# Patient Record
Sex: Female | Born: 1955 | Race: White | Hispanic: No | Marital: Married | Smoking: Former smoker
Health system: Southern US, Community
[De-identification: ages and names within clinical notes are randomized; demographics above are authoritative.]

## PROBLEM LIST (undated history)

## (undated) DIAGNOSIS — M5030 Other cervical disc degeneration, unspecified cervical region: Secondary | ICD-10-CM

## (undated) DIAGNOSIS — I10 Essential (primary) hypertension: Secondary | ICD-10-CM

## (undated) DIAGNOSIS — G5603 Carpal tunnel syndrome, bilateral upper limbs: Secondary | ICD-10-CM

## (undated) DIAGNOSIS — H40033 Anatomical narrow angle, bilateral: Secondary | ICD-10-CM

## (undated) DIAGNOSIS — D649 Anemia, unspecified: Secondary | ICD-10-CM

## (undated) DIAGNOSIS — R112 Nausea with vomiting, unspecified: Secondary | ICD-10-CM

## (undated) DIAGNOSIS — E119 Type 2 diabetes mellitus without complications: Secondary | ICD-10-CM

## (undated) DIAGNOSIS — Z9889 Other specified postprocedural states: Secondary | ICD-10-CM

## (undated) DIAGNOSIS — F419 Anxiety disorder, unspecified: Secondary | ICD-10-CM

## (undated) DIAGNOSIS — M1990 Unspecified osteoarthritis, unspecified site: Secondary | ICD-10-CM

## (undated) DIAGNOSIS — R7301 Impaired fasting glucose: Secondary | ICD-10-CM

## (undated) DIAGNOSIS — E785 Hyperlipidemia, unspecified: Secondary | ICD-10-CM

## (undated) DIAGNOSIS — C539 Malignant neoplasm of cervix uteri, unspecified: Secondary | ICD-10-CM

## (undated) HISTORY — DX: Carpal tunnel syndrome, bilateral upper limbs: G56.03

## (undated) HISTORY — DX: Impaired fasting glucose: R73.01

## (undated) HISTORY — PX: TONSILLECTOMY: SUR1361

## (undated) HISTORY — DX: Anatomical narrow angle, bilateral: H40.033

## (undated) HISTORY — DX: Other cervical disc degeneration, unspecified cervical region: M50.30

## (undated) HISTORY — DX: Hyperlipidemia, unspecified: E78.5

## (undated) HISTORY — PX: CATARACT EXTRACTION, BILATERAL: SHX1313

## (undated) HISTORY — PX: OTHER SURGICAL HISTORY: SHX169

## (undated) HISTORY — DX: Anemia, unspecified: D64.9

## (undated) HISTORY — DX: Malignant neoplasm of cervix uteri, unspecified: C53.9

## (undated) HISTORY — DX: Type 2 diabetes mellitus without complications: E11.9

## (undated) SURGERY — Surgical Case
Anesthesia: *Unknown

---

## 1985-09-15 HISTORY — PX: BREAST SURGERY: SHX581

## 2001-08-02 ENCOUNTER — Other Ambulatory Visit: Admission: RE | Admit: 2001-08-02 | Discharge: 2001-08-02 | Payer: Self-pay | Admitting: *Deleted

## 2001-09-21 ENCOUNTER — Other Ambulatory Visit: Admission: RE | Admit: 2001-09-21 | Discharge: 2001-09-21 | Payer: Self-pay | Admitting: *Deleted

## 2001-12-23 ENCOUNTER — Other Ambulatory Visit: Admission: RE | Admit: 2001-12-23 | Discharge: 2001-12-23 | Payer: Self-pay | Admitting: *Deleted

## 2002-08-02 ENCOUNTER — Other Ambulatory Visit: Admission: RE | Admit: 2002-08-02 | Discharge: 2002-08-02 | Payer: Self-pay | Admitting: *Deleted

## 2003-09-16 DIAGNOSIS — C539 Malignant neoplasm of cervix uteri, unspecified: Secondary | ICD-10-CM

## 2003-09-16 HISTORY — DX: Malignant neoplasm of cervix uteri, unspecified: C53.9

## 2003-11-30 ENCOUNTER — Other Ambulatory Visit: Admission: RE | Admit: 2003-11-30 | Discharge: 2003-11-30 | Payer: Self-pay | Admitting: *Deleted

## 2004-02-08 ENCOUNTER — Other Ambulatory Visit: Admission: RE | Admit: 2004-02-08 | Discharge: 2004-02-08 | Payer: Self-pay | Admitting: *Deleted

## 2004-04-19 ENCOUNTER — Inpatient Hospital Stay (HOSPITAL_COMMUNITY): Admission: AD | Admit: 2004-04-19 | Discharge: 2004-04-19 | Payer: Self-pay | Admitting: *Deleted

## 2004-04-30 ENCOUNTER — Ambulatory Visit: Admission: RE | Admit: 2004-04-30 | Discharge: 2004-04-30 | Payer: Self-pay | Admitting: Gynecology

## 2004-05-28 ENCOUNTER — Encounter (INDEPENDENT_AMBULATORY_CARE_PROVIDER_SITE_OTHER): Payer: Self-pay | Admitting: Specialist

## 2004-05-28 ENCOUNTER — Inpatient Hospital Stay (HOSPITAL_COMMUNITY): Admission: RE | Admit: 2004-05-28 | Discharge: 2004-05-31 | Payer: Self-pay | Admitting: *Deleted

## 2004-05-28 HISTORY — PX: ABDOMINAL HYSTERECTOMY: SHX81

## 2004-07-17 ENCOUNTER — Ambulatory Visit: Admission: RE | Admit: 2004-07-17 | Discharge: 2004-07-17 | Payer: Self-pay | Admitting: Gynecology

## 2004-11-15 ENCOUNTER — Ambulatory Visit: Admission: RE | Admit: 2004-11-15 | Discharge: 2004-11-15 | Payer: Self-pay | Admitting: Gynecology

## 2004-11-15 ENCOUNTER — Other Ambulatory Visit: Admission: RE | Admit: 2004-11-15 | Discharge: 2004-11-15 | Payer: Self-pay | Admitting: Gynecology

## 2004-11-15 ENCOUNTER — Encounter (INDEPENDENT_AMBULATORY_CARE_PROVIDER_SITE_OTHER): Payer: Self-pay | Admitting: Specialist

## 2005-02-12 ENCOUNTER — Other Ambulatory Visit: Admission: RE | Admit: 2005-02-12 | Discharge: 2005-02-12 | Payer: Self-pay | Admitting: *Deleted

## 2005-06-04 ENCOUNTER — Encounter (INDEPENDENT_AMBULATORY_CARE_PROVIDER_SITE_OTHER): Payer: Self-pay | Admitting: *Deleted

## 2005-06-04 ENCOUNTER — Other Ambulatory Visit: Admission: RE | Admit: 2005-06-04 | Discharge: 2005-06-04 | Payer: Self-pay | Admitting: Gynecology

## 2005-06-04 ENCOUNTER — Ambulatory Visit: Admission: RE | Admit: 2005-06-04 | Discharge: 2005-06-04 | Payer: Self-pay | Admitting: Gynecology

## 2005-10-13 ENCOUNTER — Other Ambulatory Visit: Admission: RE | Admit: 2005-10-13 | Discharge: 2005-10-13 | Payer: Self-pay | Admitting: *Deleted

## 2005-10-24 IMAGING — CR DG CHEST 2V
2 series · 2 of 2 positions shown · non-contrast
Comparison: none

CLINICAL DATA: The patient is pre-op for cervical cancer.
 CHEST - 2 VIEWS   
 PA and lateral views of the chest reveal the heart size to be normal with mild dilatation of the aorta.  The lung fields are clear.  There are mild degenerative changes of the thoracic spine.
 IMPRESSION
 No active disease.

[view not recorded (1 of 2)]
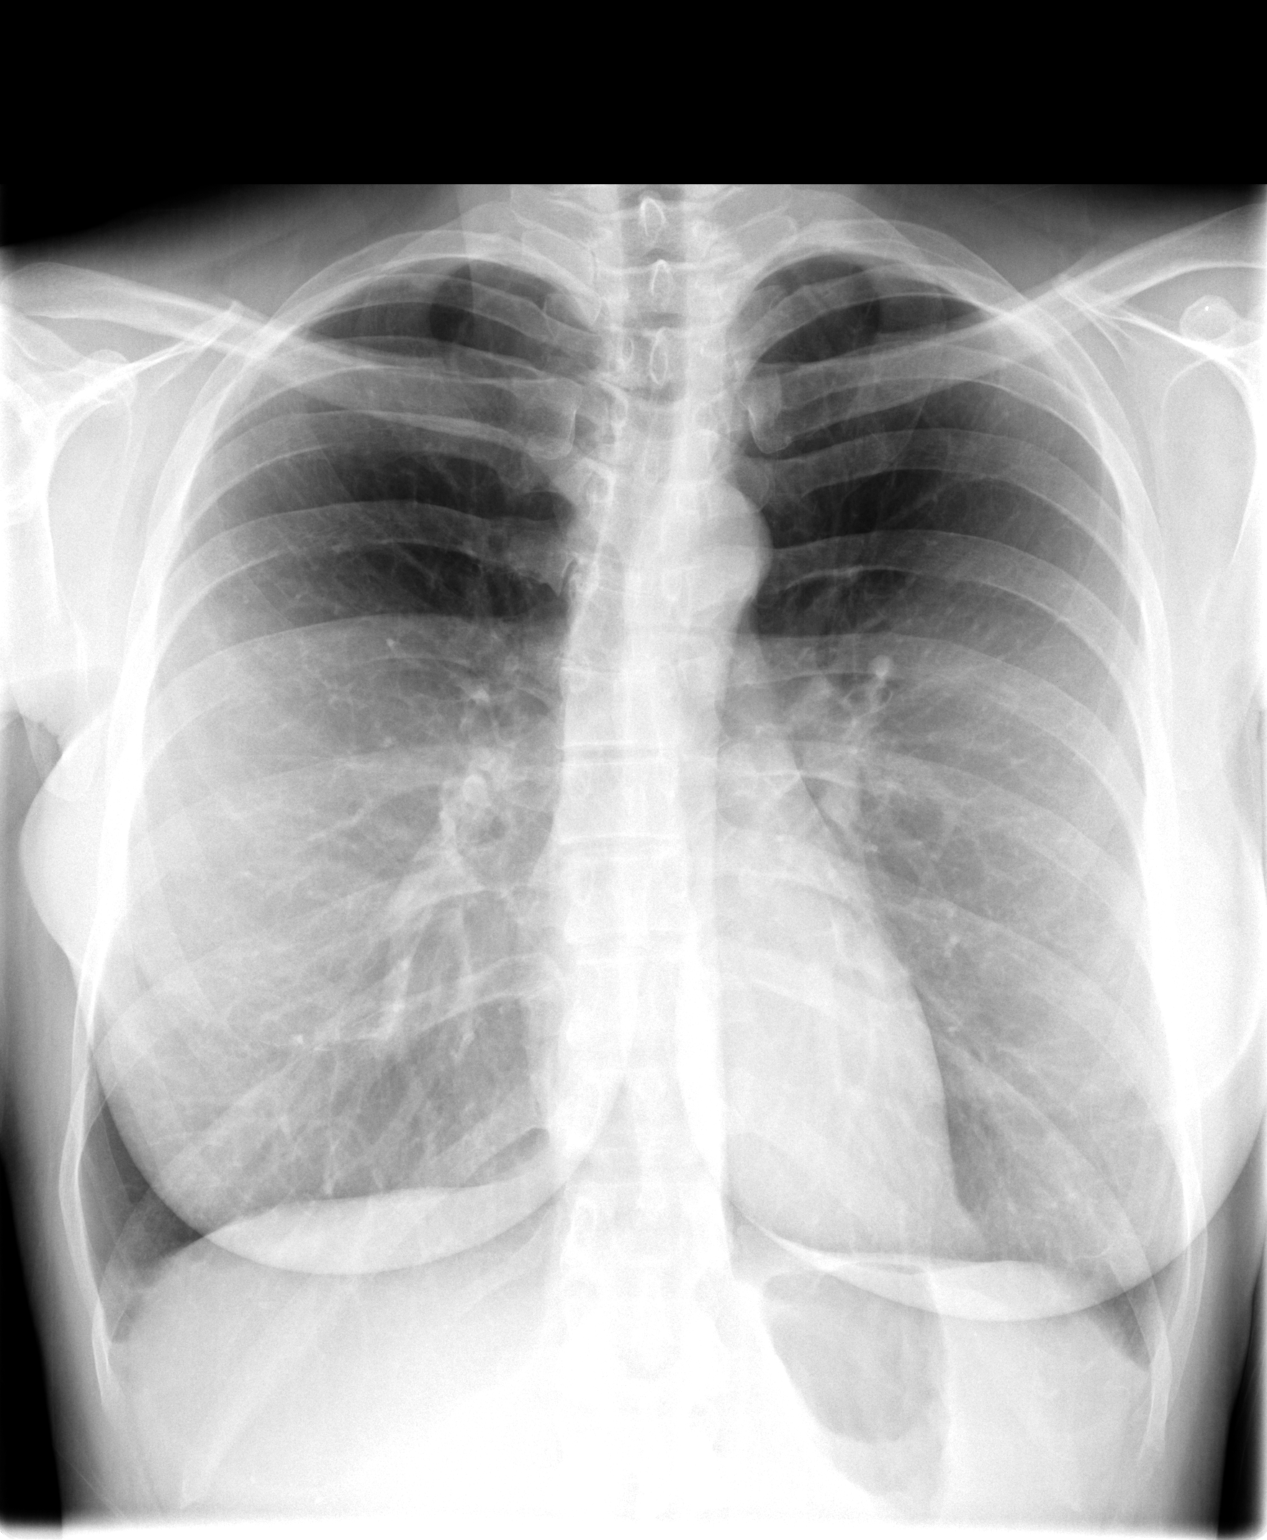

[view not recorded (2 of 2)]
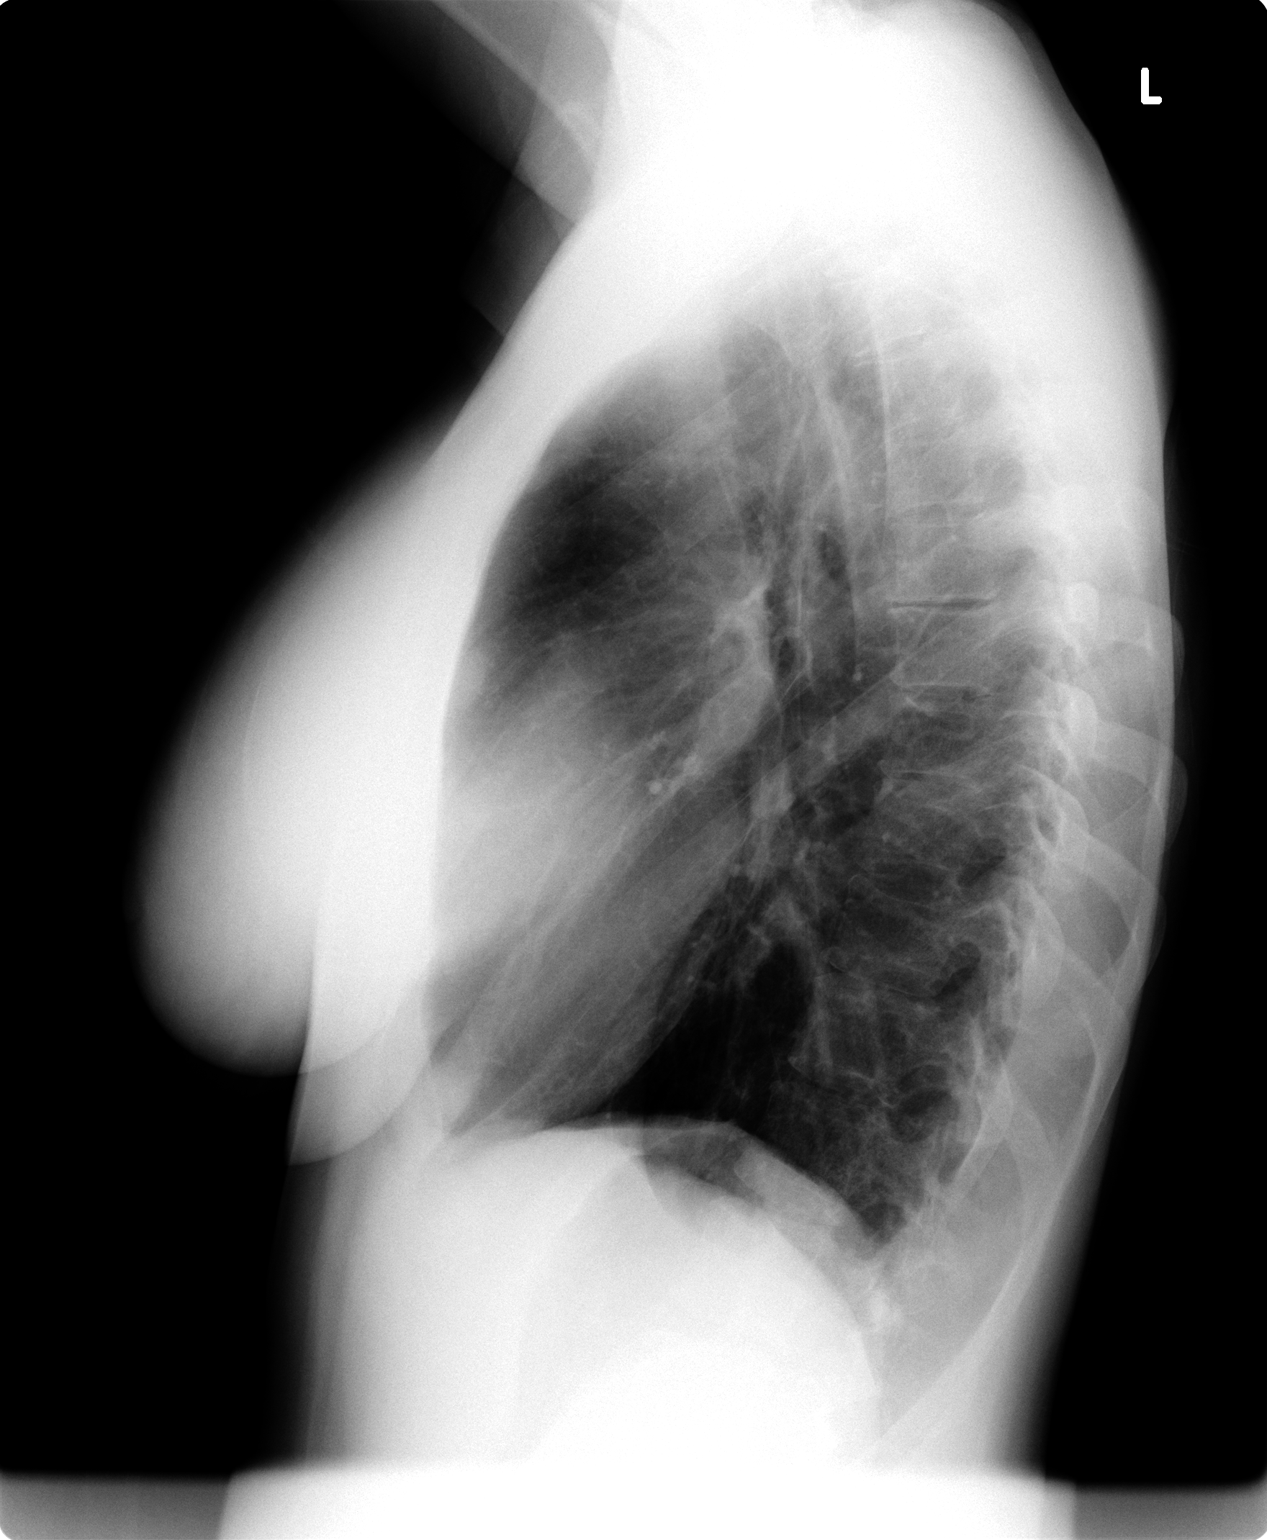

[2 of 2 positions shown; findings below may reference images not displayed]

## 2006-02-27 ENCOUNTER — Encounter (INDEPENDENT_AMBULATORY_CARE_PROVIDER_SITE_OTHER): Payer: Self-pay | Admitting: Specialist

## 2006-02-27 ENCOUNTER — Other Ambulatory Visit: Admission: RE | Admit: 2006-02-27 | Discharge: 2006-02-27 | Payer: Self-pay | Admitting: Gynecology

## 2006-02-27 ENCOUNTER — Ambulatory Visit: Admission: RE | Admit: 2006-02-27 | Discharge: 2006-02-27 | Payer: Self-pay | Admitting: Gynecology

## 2006-07-13 ENCOUNTER — Other Ambulatory Visit: Admission: RE | Admit: 2006-07-13 | Discharge: 2006-07-13 | Payer: Self-pay | Admitting: *Deleted

## 2006-07-16 LAB — HM COLONOSCOPY: HM Colonoscopy: NORMAL

## 2007-01-12 ENCOUNTER — Other Ambulatory Visit: Admission: RE | Admit: 2007-01-12 | Discharge: 2007-01-12 | Payer: Self-pay | Admitting: Gynecology

## 2007-01-12 ENCOUNTER — Ambulatory Visit: Admission: RE | Admit: 2007-01-12 | Discharge: 2007-01-12 | Payer: Self-pay | Admitting: Gynecology

## 2007-01-12 ENCOUNTER — Encounter (INDEPENDENT_AMBULATORY_CARE_PROVIDER_SITE_OTHER): Payer: Self-pay | Admitting: *Deleted

## 2007-07-08 ENCOUNTER — Ambulatory Visit: Payer: Self-pay | Admitting: Vascular Surgery

## 2007-07-19 ENCOUNTER — Other Ambulatory Visit: Admission: RE | Admit: 2007-07-19 | Discharge: 2007-07-19 | Payer: Self-pay | Admitting: *Deleted

## 2007-12-23 ENCOUNTER — Ambulatory Visit: Payer: Self-pay | Admitting: Vascular Surgery

## 2008-03-03 ENCOUNTER — Ambulatory Visit: Admission: RE | Admit: 2008-03-03 | Discharge: 2008-03-03 | Payer: Self-pay | Admitting: Gynecology

## 2008-03-03 ENCOUNTER — Other Ambulatory Visit: Admission: RE | Admit: 2008-03-03 | Discharge: 2008-03-03 | Payer: Self-pay | Admitting: Gynecology

## 2008-03-03 ENCOUNTER — Encounter: Payer: Self-pay | Admitting: Gynecology

## 2008-10-04 ENCOUNTER — Other Ambulatory Visit: Admission: RE | Admit: 2008-10-04 | Discharge: 2008-10-04 | Payer: Self-pay | Admitting: Obstetrics and Gynecology

## 2009-03-15 ENCOUNTER — Ambulatory Visit: Payer: Self-pay | Admitting: Sports Medicine

## 2009-03-15 DIAGNOSIS — Q742 Other congenital malformations of lower limb(s), including pelvic girdle: Secondary | ICD-10-CM | POA: Insufficient documentation

## 2009-03-15 DIAGNOSIS — M545 Low back pain, unspecified: Secondary | ICD-10-CM | POA: Insufficient documentation

## 2009-03-15 DIAGNOSIS — M5430 Sciatica, unspecified side: Secondary | ICD-10-CM | POA: Insufficient documentation

## 2009-03-15 DIAGNOSIS — M7750 Other enthesopathy of unspecified foot: Secondary | ICD-10-CM | POA: Insufficient documentation

## 2009-03-15 DIAGNOSIS — M216X9 Other acquired deformities of unspecified foot: Secondary | ICD-10-CM | POA: Insufficient documentation

## 2009-03-15 DIAGNOSIS — M25559 Pain in unspecified hip: Secondary | ICD-10-CM | POA: Insufficient documentation

## 2009-03-23 ENCOUNTER — Ambulatory Visit: Payer: Self-pay | Admitting: Sports Medicine

## 2009-03-23 ENCOUNTER — Encounter: Admission: RE | Admit: 2009-03-23 | Discharge: 2009-03-23 | Payer: Self-pay | Admitting: Sports Medicine

## 2009-03-23 DIAGNOSIS — Q762 Congenital spondylolisthesis: Secondary | ICD-10-CM | POA: Insufficient documentation

## 2009-05-07 ENCOUNTER — Ambulatory Visit: Payer: Self-pay | Admitting: Sports Medicine

## 2009-05-07 DIAGNOSIS — R209 Unspecified disturbances of skin sensation: Secondary | ICD-10-CM | POA: Insufficient documentation

## 2009-05-07 DIAGNOSIS — M479 Spondylosis, unspecified: Secondary | ICD-10-CM | POA: Insufficient documentation

## 2009-08-30 ENCOUNTER — Ambulatory Visit: Payer: Self-pay | Admitting: Sports Medicine

## 2009-08-30 DIAGNOSIS — M25539 Pain in unspecified wrist: Secondary | ICD-10-CM | POA: Insufficient documentation

## 2009-08-30 DIAGNOSIS — G5600 Carpal tunnel syndrome, unspecified upper limb: Secondary | ICD-10-CM | POA: Insufficient documentation

## 2010-08-26 ENCOUNTER — Encounter: Payer: Self-pay | Admitting: Sports Medicine

## 2010-10-15 NOTE — Assessment & Plan Note (Signed)
Summary: LEFT FINGER NUMBNESS,MC   Vital Signs:  Patient profile:   55 year old female BP sitting:   121 / 77  Vitals Entered By: Lillia Pauls CMA (August 30, 2009 11:28 AM)  History of Present Illness: 55 yo F presents with numbness left thumb/index finger off and on past year but more consistent recently. Usually worse at night when has numbness most of hand but starts thumb/hand Works in [employer] - not a lot of typing but is on computer sometimes Taking glucosamine and tried aleve Sometimes wakes her up at night and shakes hands No issues with neck, shoulder, elbow No issues with right side.  Physical Exam  General:  VS reviewed.  Well appearing, NAD. Msk:  L wrist: No gross deformity.  No thenar/hypothenar atrophy. No swelling/bruising No TTP. FROM wrist/fingers Tinels negative at elbow and wrist though does "feel it" in index finger and thumb with this - not numb. Negative phalens and reverse phalens. Grip strength 5/5.  Opponens strength 4/5 Cap refill < 2 sec. Additional Exam:  MSK u/s:  Median nerve visualized left wrist - 0.11cm, diagnostic of carpal tunnel syndrome/median nerve swelling   Impression & Recommendations:  Problem # 1:  CARPAL TUNNEL SYNDROME (ICD-354.0) Assessment New  Start nighttime cockup wrist splint.  Add vitamin b6.  Had been doing grip exercises on left - advised to stop these.  F/u in 1 month.  Consider neurontin if not improving or carpal tunnel injection.  Orders: US EXTREMITY NON-VASC REAL-TIME IMG [phone])  Problem # 2:  WRIST PAIN, LEFT (JWJ-191.47) Assessment: New  Orders: Brace Wrist (W2956)  Patient Instructions: 1)  Use the cock-up wrist splint at night when sleeping. 2)  Take vitamin B6 100mg  daily. 3)  Avoid the wrist exercises in your left wrist.  Relatively rest this. 4)  Follow up with [place] in 1 month.

## 2010-10-15 NOTE — Assessment & Plan Note (Signed)
Summary: ORTHOTICS/JW   Vital Signs:  Patient profile:   55 year old female BP sitting:   114 / 60  Vitals Entered By: Lillia Pauls CMA (March 23, 2009 10:40 AM)  History of Present Illness: 55 yo F here for f/u back pain to get orthotics.  Reviewed patient's note from a week ago.  She has had low back pain for 2 years.  Was seen by a chiropractor for this issue but not much benefit when she went there.  Has numbness and pain down into left leg along with her lower left back pain.  She did have x-rays at chiropractor in 11/08 which she brought with her today - evidence of Grade 1 spondy L4 on L5 with osteophytes around L3 and L4.  Mod amount of DDD especially on left side L4-5 and L5-S1.  See PE for exam - feel she would benefit from orthotics to correct feet and give her more normal gait.  Physical Exam  General:  Well-developed,well-nourished,in no acute distress; alert,appropriate and cooperative throughout examination Msk:  Both SI joints are mobile today and non painful SLR is neg at 70 deg bilat  toe, heel  and tandem walk normal. Left hip sits slightly lower with walking Repeated leg length today without evidence of shortening while lying or sitting up. No hallux rigidus. Cavus feet with some pronation when standing. Hammer toe deformity pronounced left 2nd toe and less so right 2nd toe.   Transverse arch breakdown bilaterally.  Large callus on left side. Diffuse mild metatarsal pain L > R. Splaying of 2nd-3rd toes on left.   Impression & Recommendations:  Problem # 1:  METATARSALGIA (ICD-726.70) Patient was fitted for a : standard, cushioned, semi-rigid orthotic. The orthotic was heated and afterward the patient stood on the orthotic blank positioned on the orthotic stand. The patient was positioned in subtalar neutral position and 10 degrees of ankle dorsiflexion in a weight bearing stance. After completion of molding, a stable base was applied to the orthotic  blank. The blank was ground to a stable position for weight bearing. Size: 8 red cambray Base: med EVA blue Posting: none Additional orthotic padding: metatarsal pads added with additional ones placed in two other pairs of shoes. 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  Will repeat these.  Hope correction of feet and addition of support will help.  Also to avoid extension exercises and focus on core with extension ONLY to neutral.  Given handout and shown several exercises for her to do on a daily basis.   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

## 2010-10-15 NOTE — Assessment & Plan Note (Signed)
Summary: NP BACK PAIN/MJD   Vital Signs:  Patient profile:   55 year old female Height:      68 inches Weight:      128 pounds BMI:     19.53 BP sitting:   110 / 70  Vitals Entered By: Lillia Pauls CMA (March 15, 2009 9:50 AM)  History of Present Illness: np here today with low back pain x 2 years. referred here by aaron Santa Cruz who she see for PT. has been seeing him now for 6 wks but has numbness and pain after PT  and exercises. pt is a walker and walks about 16 miles per week. the pain that she has radiates down both her legs and into her toes. pt does take advil to help relieve the pain. also has pain at night while in bed but only when she turns over in bed. MEDS: MVI, Calcium, and advil  biggest sign is numbness which is much worse Lt than Rt she sometimes only starts with numbness at foot  has piriformis signs on RT  uses a ball for massage and this helps  In morning has back pain she will have about 30  mins of this in AM  saw a chiropractor - [place] - last year who did LS spine and saw DDD RX did not make that much difference she thinks back pain worsened somewhat after Ca surgery as she took time to get core strength back  Past History:  Past Medical History: 5 years ago radical hys with lymph node dissection one lymph node on left was trapped with nerve she had left leg weakness following this surgery this has improved with time has much more numbness on left side  Physical Exam  General:  Well-developed,well-nourished,in no acute distress; alert,appropriate and cooperative throughout examination Msk:  Lt hip Hip flexor and sartorius are weak normal hip rotation and joint exam all other mm are strong  RT hip Rt buttocks direct tenderness at piriformis this reproduces pain but no real sciatic sxs with this today no hip  ROM norm strength in all MM groups  Both SI joints are mobile today and non painful  Faber on RT is tight SLR is neg at 70 deg  bilat back flexion causes some pain down left leg at > 90 deg lat bend and rotation of back causes no pain back extension slight lumbar pain at 30deg  toe, heel  and tandem walk norm  Left hip sits lower with walking leg length reveals that left is 1 cm shorter however there is some mild scoliosis in thoraco- lumbar spine (<10 deg) which balances some of the difference  Extremities:  Gait she turns RT foot outward about 10 deg and is neutral on left Neurologic:  No cranial nerve deficits noted. Station and gait are normal. Plantar reflexes are down-going bilaterally. DTRs are symmetrical throughout. Sensory, motor and coordinative functions appear intact.   Impression & Recommendations:  Problem # 1:  BACK PAIN, LUMBAR, CHRONIC (ICD-724.2) I supsect this is primarily DDD I do not see signs of disk herniation I do not think this contributes much to her chronic sciatic and nerve sxs ln left or RT this may contribute some but provocative tests of back do not provoke sxs  Problem # 2:  SCIATICA, BILATERAL (ICD-724.3) On left some of this is sciatic but a  lot may relate to branches of femoral nerve note she had one branch severed during surgery hip flexion and sartorius are week  on left - prob 2/2 this I think this nerve and contibutions/ feedback to sciatic are more easily irritated since orignial injury  Problem # 3:  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) no hx or sign of recurrent cervical cancer however, I do think the nerve injury at surgery has left her with residual left leg sxs  Problem # 4:  HAMMER TOE (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) This is pretty extreme on left given hammer toe pads to try  Problem # 5:  CAVUS DEFORMITY OF FOOT, 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) This is severe and may contribute to some increased numbness as I think she gets high impact and sometimes numbness starting from her feet we need to assess her shoes she is a candidate for good custom orthotics for walking and arch support for dress  shoes  return for this if willing  Problem # 6:  METATARSALGIA (ICD-726.70) large morton's callus left which needs padding on RTC  Problem # 7:  HIP PAIN, RIGHT (ICD-719.45) agree this is piriformis and prob cause of sciatic sxs on RT I doubt she has lumbar spinal stenosis but will review xrays  Patient Instructions: 1)  Two piriformis stretches 2)  standing hip rotations 3)  work left hip flexor and sartorius ( hacky sack) 4)  vitamin B 6 100 mgm per day 5)  lastly I would like to see you get custom orthotics and work with arch and metatarsal support in all your shoes

## 2010-10-15 NOTE — Letter (Signed)
Summary: *Consult Note  [facility]  [address]   [place], [place] [address]   Phone: [phone]  Fax: [phone]    Re:    Tracey Santiago DOB:    Oct 29, 1955   Dear Clifton Custard:    Thank you for requesting that we see the above patient for consultation.  A copy of the detailed office note will be sent under separate cover, for your review.  Evaluation today is consistent with a number of problems as noted in my assessment.  I think all of these contribute to her greater frequency of numbness particularly in left leg. I do think your therapy should be beneficial and I would suggest simply avoiding those that trigger more sxs. Please note that I gave her suggestions for a couple of specific exercises and stretches which I hope you can incorporate.  New Medications started today include: Vit B6 fr ay benefit on nerve irritation.    Thank you for this consultation.  If you have any further questions regarding the care of this patient, please do not hesitate to contact me @ [phone].  Thank you for this opportunity to look after your patient.  Sincerely,   Enid Baas MD

## 2010-10-15 NOTE — Assessment & Plan Note (Signed)
Summary: FU ck orthotics/jw   Vital Signs:  Patient profile:   55 year old female BP sitting:   100 / 60  Vitals Entered By: Lillia Pauls CMA (May 07, 2009 10:17 AM)  CC:  paresthesia of L hand.  History of Present Illness: 55yo F here for f/u orthotics and parethesia of L hand  Orthotics: s/p  ~6wks since she started wearing custom orthotics.  States improvement of back pain when she wears them however, she has not been able to wear them consistently b/c of her summer shoes. She has purchased MTP pads for all of her shoes and has been wearing the toe strap to help with her hammer toes.  Previous note and xray reviewed.  L hand paresthesia: Chronic condition.  States that it started  ~1yr ago without a known event but has been a gradual frequency over the past few months.  It is mainly in the 1st, 2nd, and 3rd digits of the L hand particularly over the dorsal aspect and mainly at night but has been lasting a few hours in the morning.  She denies any neck discomfort or symptoms in the upper arm or forearm.  No weakness and no dropping of items.  She spends several hours a day on the computer.    Back pain: Improvement in pain.  Has been performing flexion exercises as instructed.  No worsening of radiating pain down the left leg.  Recent xrays conveyed no change in Grade 1 spondy.    Physical Exam  General:  VS reviewed.  Well appearing, NAD. Neck:  No ttp along cervical spine ROM: Limitation of lateral rotation to the right  ~45deg compared to 70deg on left Limitation of lateral flexion to the right compared to the left  Special test: Neg Spurlings  Msk:  Back: ROM- flexion past 90deg, extension to -10 deg, lateral rotation  ~45deg Tests- neg straight leg sitting and lying  Left arm: 5/5 strength w/ supination, pronation, biceps, and wrist flexion/extension  Special test: neg phalens and tinels   Neurologic:  Strength testing of median and radial nerve  intact   Impression & Recommendations:  Problem # 1:  SPONDYLOLISTHESIS (AOZ-308.65) Assessment Unchanged Evidence of no change in Grade 1 spondy on recent xray.  Plan of treatment remains the same from before...avoid extension movements of the back and work on core strengthening of the ab muscles and hip flexors.  Plan is to get her connected for yoga/pilates classes.  Reassess in 3-4 months.  Problem # 2:  ARTHRITIS, CERVICAL SPINE (ICD-721.90) Assessment: New Likely cause of nerve irritation b/c of L trapezius muscle spasm leading to paresthesia sensation of dorsal aspect of left hand.  Pt has limited lateral rotation and lateral flexion to the right but improved by 5-10 deg simply by performing neck exercises in the office.  Plan to place her on home neck exercises to help relax the trapezius to gain more function and ROM of the neck.  Also, she is to take a close look at her position in front of the computer for proper position/posture to avoid straining the neck.   Exam not c/w median nerve entrapment seen more carpal tunnel syndrome. Xrays not beneficial at this time as it will not change the management.  Patient Instructions: 1)  Neck lateral rotation exercises- look towards right against resistance for 10 seconds (3 sets) 2)  Neck lateral flexion exercises- lean head towards shoulder against resistance for 10 seconds (3 sets) 3)  Arm swings to  work trapezius muscles 4)  No back extension exercises 5)  Please schedule a follow-up appointment in 3 months.

## 2010-10-17 NOTE — Consult Note (Signed)
Summary: [facility]  [facility]   Imported By: Marily Memos 09/19/2010 08:55:02  _____________________________________________________________________  External Attachment:    Type:   Image     Comment:   External Document

## 2010-12-30 LAB — HM MAMMOGRAPHY: HM Mammogram: NORMAL

## 2011-01-15 LAB — HM PAP SMEAR: HM Pap smear: NORMAL

## 2011-01-28 NOTE — Consult Note (Signed)
NAME:  Tracey Santiago, Tracey Santiago               ACCOUNT NO.:  [id number]   MEDICAL RECORD NO.:  [id number]          PATIENT TYPE:  OUT   LOCATION:  GYN                          FACILITY:  [facility]   PHYSICIAN:  De Blanch, M.D.DATE OF BIRTH:  14-Aug-1956   DATE OF CONSULTATION:  DATE OF DISCHARGE:                                 CONSULTATION   CHIEF COMPLAINT:  Cervical cancer.   INTERVAL HISTORY:  Since her last visit, the patient has done well.  She  saw Dr. [name] approximately six months ago.  She denies any GI or GU  symptoms.  She has no pelvic pain, pressure, vaginal bleeding or  discharge.   She had a colonoscopy recently, which is negative.   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  Final pathology showed minimal residual  disease.  Lymph nodes were negative.  Patient did not require any  further therapy postoperatively.   PAST MEDICAL HISTORY:  Medical illnesses none.   PAST SURGICAL HISTORY:  Cesarean section, radical hysterectomy, and  pelvic lymphadenectomy.   DRUG ALLERGIES:  None.   CURRENT MEDICATIONS:  Multivitamins.   OBSTETRICAL HISTORY:  Gravida 3.   FAMILY HISTORY:  Negative for gynecologic, breast, or colon cancer.   SOCIAL HISTORY:  Patient is married.  She works in an Geneticist, molecular and  exercises regularly.   REVIEW OF SYSTEMS:  A 10-point comprehensive review of systems is  negative, except as noted above.   PHYSICAL EXAMINATION:  VITAL SIGNS:  Weight 132 pounds.  GENERAL:  Patient is a healthy white female in no acute distress.  HEENT:  Negative.  NECK:  Supple without thyromegaly.  There is no supraclavicular or  inguinal adenopathy.  ABDOMEN:  Soft and nontender.  No mass, organomegaly, ascites, or  hernias are noted.  PELVIC:  EG/BUS, vagina, bladder, and urethra are normal.  The cervix  and uterus are surgically absent.  The  adnexa are without masses.  Rectovaginal exam confirms.  Pap smears are obtained.   IMPRESSION:  Cervical cancer, stage IB.  No evidence of recurrent  disease.   PLAN:  Patient is to return to see Dr. [name] in six months.  Return to  see [place] in one year.      De Blanch, M.D.  Electronically Signed     DC/MEDQ  D:  01/12/2007  T:  01/12/2007  Job:  [id number]   cc:   Telford Nab, R.N.  [address]  [place], [place] [address]   Almedia Balls. [name], M.D.  Fax: [phone]

## 2011-01-28 NOTE — Consult Note (Signed)
NAME:  Tracey Santiago, Tracey Santiago               ACCOUNT NO.:  [id number]   MEDICAL RECORD NO.:  [id number]          PATIENT TYPE:  OUT   LOCATION:  GYN                          FACILITY:  [facility]   PHYSICIAN:  De Blanch, M.D.DATE OF BIRTH:  [birth year]   DATE OF CONSULTATION:  03/03/2008  DATE OF DISCHARGE:                                 CONSULTATION   GYN ONCOLOGY CLINIC:   CHIEF COMPLAINT:  Cervical cancer.   INTERVAL HISTORY:  The patient returns today for continuing followup of  stage I-B cervical cancer.  Since her last visit, she has done well.  She denies any GI or GU symptoms, has no pelvic pain, pressure, vaginal  bleeding or discharge.  Functional status is excellent.   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  Final pathology showed minimal residual disease.  Lymph  nodes were negative, margins were negative and we did not offer any  additional therapy.   PAST MEDICAL HISTORY:  MEDICAL ILLNESSES:  None.  PAST SURGICAL HISTORY:  Cesarean section, radical hysterectomy, pelvic  lymphadenectomy.   DRUG ALLERGIES:  None.   CURRENT MEDICATIONS:  Multivitamins.   OBSTETRICAL HISTORY:  Gravida 3.   FAMILY HISTORY:  Negative for gynecologic, breast or colon cancer.   SOCIAL HISTORY:  The patient is married.  She works in an Geneticist, molecular.  She does not smoke.   REVIEW OF SYSTEMS:  Ten-point comprehensive review of systems negative,  except as noted above.   PHYSICAL EXAM:  Weight 135 pounds.  IN GENERAL:  The patient is a healthy, white female in no acute  distress.  HEENT:  Negative.  NECK:  Supple without thyromegaly.  There is no supraclavicular or  inguinal adenopathy.  ABDOMEN:  Soft, nontender, no mass, organomegaly, ascites or hernia is  noted.  Pfannenstiel incision is well-healed.  PELVIC EXAM:  EG, BUS, vagina, bladder and urethra are normal.   Cervix  and uterus are surgically absent.  The cuff is well-supported.  No  lesions are noted.  Bimanual and rectovaginal exam reveal no masses,  induration or nodularity.  LOWER EXTREMITIES:  Without edema or varicosities.   IMPRESSION:  Stage I-B cervical cancer, now with four years of followup  following radical hysterectomy and pelvic lymphadenectomy.  No evidence  of recurrent disease.   PLAN:  The patient will be transferring her care to Dr. Leanora Ivanoff.  She will see Dr. Hyacinth Meeker in six months and return to see [place] in one year.      De Blanch, M.D.  Electronically Signed     DC/MEDQ  D:  03/03/2008  T:  03/03/2008  Job:  [id number]   cc:   Telford Nab, R.N.  [address]  [place], [place] [address]   Dr. Leanora Ivanoff

## 2011-01-31 NOTE — Consult Note (Signed)
NAME:  Tracey, Santiago               ACCOUNT NO.:  [id number]   MEDICAL RECORD NO.:  [id number]          PATIENT TYPE:  OUT   LOCATION:  GYN                          [facility]ACILITY[facility]   PHYSICIAN:  De Blanch, M.D.DATE OF BIRTH:  07/21/1956   DATE OF CONSULTATION:  11/15/2004  DATE OF DISCHARGE:                                   CONSULTATION   A 55 year old white female returns for continuing followup of cervical  cancer.   INTERVAL HISTORY:  Since her last visit, she has seen Dr. Jeanine Luz,  apparently she had a good exam.   Since then, she has done well. She has some urinary hesitancy but does feel  like she ultimately empties completely. She has also had a slight amount of  constipation. At the time of surgery, she had a minor injury to her left  obturator nerve. She reports that she is walking three miles a day. She  notes that at the end of a three mile walk, she sometimes feels like her leg  drags somewhat.  Otherwise she is doing well. She did have some right lower  quadrant evaluated by Dr. [name] with an ultrasound which apparently was  negative.   HISTORY OF PRESENT ILLNESS:  The patient underwent radical hysterectomy and  pelvic lymphadenectomy May 28, 2004. Final pathology showed no  evidence of metastatic disease. There was a small amount of residual cancer  in the cervix and no other high risk features (stage Ib1 squamous cell  carcinoma of the cervix).   PAST MEDICAL HISTORY:  Medical illnesses none.   PAST SURGICAL HISTORY:  Cesarean section, radical hysterectomy.   ALLERGIES:  Drug allergies none.   CURRENT MEDICATIONS:  Multivitamins with iron.   OBSTETRICAL HISTORY:  Gravida 3.   FAMILY HISTORY:  Negative for gynecologic, breast or colon cancer.   SOCIAL HISTORY:  The patient is married, she works in an Geneticist, molecular, smokes  a few cigarettes a day, exercises regularly.   REVIEW OF SYMPTOMS:  Negative except for symptoms noted above.   PHYSICAL EXAMINATION:  VITAL SIGNS:  Height 5 foot 8, weight 135 pounds,  blood pressure 130/80.  GENERAL:  The patient is a healthy white female in no acute distress.  HEENT:  Negative.  NECK:  Supple without thyromegaly. There is no supraclavicular or inguinal  adenopathy.  ABDOMEN:  Soft, nontender, no mass, organomegaly, ascites or hernias are  noted.  PELVIC:  EGBUS, vagina, bladder, urethra are normal. The vaginal cuff is  well healed, well supported and no lesions are noted. Bimanual and  rectovaginal exam reveal no masses, induration or nodularity.   Pap smears are obtained.   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   PLAN:  The patient will return to see Almedia Balls. Fore, M.D. in three months  and return to see [place] in six months for continuing surveillance.      DC/MEDQ  D:  11/15/2004  T:  11/15/2004  Job:  [id number]   cc:   Almedia Balls. Fore,  M.D.  [address] [place]  [place] [address]  Fax: [phone]   Telford Nab, R.N.  [phone] [address]  [place], [place] [address]

## 2011-01-31 NOTE — H&P (Signed)
NAME:  Tracey Santiago, Tracey Santiago                         ACCOUNT NO.:  [id number]   MEDICAL RECORD NO.:  [id number]                   PATIENT TYPE:  INP   LOCATION:  NA                                   FACILITY:  [facility]   PHYSICIAN:  Almedia Balls. Fore, M.D.                DATE OF BIRTH:  [birth year]   DATE OF ADMISSION:  DATE OF DISCHARGE:                                HISTORY & PHYSICAL   DATE OF ADMISSION:  May 28, 2004.   CHIEF COMPLAINT:  Carcinoma of cervix.   HISTORY OF PRESENT ILLNESS:  The patient is a 55 year old gravida 2, para 2  whose last menstrual period was May 20, 2004.  She has been followed in  our office for many years.  She had an abnormal Pap smear shows ASCUS in  March of 2005.  Repeat Pap smear showed a higher grade lesion.  Colposcopy  and endocervical curettage revealed high grade dysplasia in June 2005.  The  patient underwent conization of the cervix in August because of inability to  move ahead quicker and because of vacation.  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  This was  in the deep conization area.  The exocervix showed only minimal atypical  squamous mucosa.  She is admitted at this time for radical hysterectomy,  possible bilateral salpingo-oophorectomy, lymph node dissection.  She has  had this procedure fully discussed with her and with her husband to include  the procedure in detail, the risks involved to include risk of anesthesia,  injury to bowel, bladder, blood vessels, ureters, postoperative hemorrhage,  infection, recuperation, use of a catheter postoperatively and possible slow  return of bowel function following this procedure.  She and her husband both  fully understand these considerations and wish to proceed on 28 May 2004.   PAST MEDICAL HISTORY:  Includes history of Cesarean section. She has taken  Midrin on occasion for migraine headaches and occasional alprazolam  and  Klonopin for an occasional anxiety problem.  Her health has been good  otherwise.   REVIEW OF SYMPTOMS:  HEENT:  Headaches as noted above.  CARDIORESPIRATORY:  Negative.  GASTROINTESTINAL:  Negative.  GENITOURINARY:  As in the present  illness.  NEUROMUSCULAR: Negative.   FAMILY HISTORY:  Noncontributory.   PHYSICAL EXAMINATION:  VITAL SIGNS: Height 5 feet 7-1/4 inches, weight 138  pounds, blood pressure 122/66, pulse 80, respirations 18.  GENERAL:  Well-developed white female in no acute distress.  HEENT:  Within normal limits.  NECK:  Supple without masses, adenopathy or bruits.  HEART:  Regular rate and rhythm without murmurs.  LUNGS: Clear to auscultation and percussion.  BREASTS:  Examined sitting and lying without mass.  Axilla negative.  There  are bilateral implants present.  ABDOMEN:  Flat and soft with no palpable mass or tenderness.  Lower  abdominal transverse scar  is well healed.  PELVIC:  External genitalia, Bartholin's, Urethral and Skene's glands within  normal limits.  Cervix is status post conization.  Uterus is mid position,  top normal size, nontender.  There are no palpable adnexal masses.  Anterior  and posterior cul-de-sac exam is confirmatory.  EXTREMITIES:  Within normal limits.  CENTRAL NERVOUS SYSTEM:  Grossly intact.  SKIN:  Without suspicious lesions.   IMPRESSION:  Squamous cell carcinoma of cervix, stage IB,1.   DISPOSITION:  As noted above.                                               Almedia Balls. [name], M.D.    SRF/MEDQ  D:  05/22/2004  T:  05/22/2004  Job:  [id number]

## 2011-01-31 NOTE — Op Note (Signed)
NAME:  Tracey Santiago, Tracey Santiago                         ACCOUNT NO.:  [id number]   MEDICAL RECORD NO.:  [id number]                   PATIENT TYPE:  INP   LOCATION:  0451                                 FACILITY:  [facility]   PHYSICIAN:  De Blanch, M.D.         DATE OF BIRTH:  [birth year]   DATE OF PROCEDURE:  05/28/2004  DATE OF DISCHARGE:  05/31/2004                                 OPERATIVE REPORT   PREOPERATIVE DIAGNOSIS:  Stage IBI squamous cell carcinoma of the cervix.   POSTOPERATIVE DIAGNOSIS:  Stage IBI squamous cell carcinoma of the cervix.   PROCEDURE:  Abdominal radical hysterectomy, pelvic lymphadenectomy, ovarian  transposition, suprapubic catheter placement.   SURGEON:  De Blanch, M.D.   ASSISTANTS:  Almedia Balls. [name], M.D., Telford Nab, RN.   ANESTHESIA:  General orotracheal anesthesia.   ESTIMATED BLOOD LOSS:  600 cc.   SURGICAL FINDINGS:  At the time of the exploratory laparotomy, the upper  abdomen, including the liver, spleen, stomach, omentum, small and large  bowel, and pelvic and periaortic lymph nodes were normal.  The uterus was  upper limits of normal in size.  The ovaries appear normal.  The patient had  requested if the ovaries appear normal that they be left in place.  The  cervix was slightly enlarged.  There were several enlarged pelvic lymph  nodes which were submitted for frozen section and returned as negative.  In  the course of performing the left obturator lymphadenectomy, a small portion  of the left obturator nerve was disrupted and reapproximated using 4-0  Prolene suture.   PROCEDURE:  The patient was brought to the operating room and after  satisfactory attainment of general anesthesia, was placed in a modified  lithotomy position in [place] stirrups.  The anterior abdominal wall,  perineum, and vagina were prepped with Betadine.  A Foley catheter was  inserted, and the patient was draped.  The abdomen was entered through a  prior Pfannenstiel incision.  The upper abdomen and pelvis were explored  with the above-noted findings, and a Bookwalter retractor was positioned  with care taken to avoid pressure on the femoral nerve.  The bowel was  packed out of the pelvis.  The uterus was grasped with long Kelly clamps.  The round ligaments were divided, and the perirectal and perivesical spaces  opened.  The attachment of the ovary to the uterine cornu and fallopian tube  were cross-clamped, divided, free tied, and suture-ligated, thus preserving  the tubes and ovaries.  The uterine artery was identified, skeletonized, and  then doubly clipped, divided, and the proximal portion of the uterine artery  was ligated with a 2-0 silk suture for further identification.  The cardinal  ligament was isolated and divided using a linear stapling device.  A similar  procedure was performed on both sides of the pelvis.   The ureters were detached from their attachment to the peritoneum, just  cephalad to the  uterosacral ligaments and mobilized laterally.  An incision  was made in the posterior cul de sac overlying the rectovaginal septum and  carried laterally.  The rectovaginal septum was then developed, and the  uterosacral ligaments were skeletonized.  The uterosacral ligaments were  then divided with the linear stapler.  The bladder flap was advanced to  sharp and blunt dissection off of the cervix and upper vagina.  The ureters  were then mobilized from the paracervical tunnel using a right angle clamp  and Anderson clamps above the ureter.  The anterior vesicouterine ligament  was then clamped, divided, and suture ligated.  This procedure was repeated  until the ureter was fully mobilized to its insertion with the bladder.  The  ureter was then held laterally, and the posterior vesicouterine ligament was  clamped, cut, and suture ligated.  Paracervical tissue was then clamped,  cut, and suture ligated.  The vaginal angles  were crossed clamped, divided,  and the vaginal transected.  The uterus, cervix, and upper vagina were  handed off the operative field after an adequate cuff of vaginal tissue was  ascertained.  The vaginal angles were transfixed with 0 Vicryl, and the  central portion of the vagina were closed with interrupted figure-of-eight  sutures of 0 Vicryl.  The peritoneum of the posterior cul de sac, vaginal  angle, and peritoneum from the bladder flap were then reapproximated with a  series of interrupted sutures, thus obliterating the cul de sac.   Attention was turned to performing the pelvic lymphadenectomy.  All of the  lymph nodes overlying the external iliac artery and vein and internal iliac  artery were excised and submitted as external iliac nodes.  Using the vein  retractor, the external iliac vein was elevated, and we entered the  obturator fossa.  Dissection was then proceeded to remove lymph nodes from  the obturator fossa, extending to the psoas muscle and obturator internus  muscle.  This is carried down to the obturator canal.  The obturator  nerve  was identified, and the dissection then carried cephalad.  In the course of  completing this dissection, the juncture of the bifurcation of the internal  and external iliac veins, exposure was difficult, and after removing the  lymph nodes, it was recognized that a small defect had been created in the  obturator nerve.  The obturator nerve sheath was then reapproximated with  three interrupted sutures of 4-0 Prolene, resulting in good approximation of  the nerve.  Hemostasis was achieved throughout the lymphadenectomy with  cautery and hemoclips.   The ovaries were marked with two large hemoclips and then mobilized into the  pericolic gutters, where they were sutured with 2-0 Vicryl.   The retropubic space was opened, and an incision was made in the dome of the bladder.  A suprapubic catheter was brought through a stab incision  just  above the pubis, and the catheter placed in the bladder.  The bladder was  then closed with two layers of suture, the first being a full-thickness  closure, using 2-0 Vicryl.  The second layer was an embrocating suture using  2-0 Vicryl.   Packs were removed from the pelvis, and the pelvis was irrigated.  Hemostasis was ascertained.  The packs and retractors were removed, and the  anterior abdominal wall closed in layers.  The first was a running 2-0  Vicryl suture on the peritoneum.  The subfascial region was explored,  hemostasis achieved with cautery, and the fascia  reapproximated with a  running suture of 0 PDS.  Subcutaneous tissue was irrigated.  Hemostasis  achieved with  cautery, and the skin was closed with skin staples.  A dressing was applied.  The suprapubic catheter was sutured to the skin with 2-0 silk suture.  Patient was awakened from anesthesia and taken to the recovery room in  satisfactory condition.  Sponge, needle, and instrument counts were correct  x2.                                               De Blanch, M.D.    DC/MEDQ  D:  05/31/2004  T:  06/01/2004  Job:  [id number]   cc:   Telford Nab, R.N.  [address]  [place], [place] [address]   Almedia Balls. Fore, M.D.  [phone] [address] [place]  [place] [address]  Fax: [phone]

## 2011-01-31 NOTE — Discharge Summary (Signed)
NAME:  DONDI, Tracey Santiago                         ACCOUNT NO.:  [id number]   MEDICAL RECORD NO.:  [id number]                   PATIENT TYPE:  INP   LOCATION:  0451                                 FACILITY:  [facility]   PHYSICIAN:  Almedia Balls. Fore, M.D.                DATE OF BIRTH:  1955/11/11   DATE OF ADMISSION:  05/28/2004  DATE OF DISCHARGE:  05/31/2004                                 DISCHARGE SUMMARY   HISTORY:  The patient is a 55 year old with stage IB1 squamous cell  carcinoma of the cervix, who was admitted for radical hysterectomy on 28 May 2004.  The remainder of her history and physical are as previously  dictated.  Preoperative lab work includes hemoglobin of 12.8, 6900 white  blood cells.  CMET panel was slightly elevated on admission with glucose  slightly elevated at 111.  Immediate postoperative hemoglobin 11.7.  Hemoglobin on 14 September, 10.8 with 10,800 white blood cells.  Glucose on  14 September was elevated slightly at 153.  Calcium down at 8.1.  Blood type  A positive.  Chest x-ray was within normal limits without any lesions.   HOSPITAL COURSE:  The patient was taken to the operating room on 28 May 2004, at which time radical hysterectomy, pelvic lymphadenectomy  were performed.  During this procedure, the patient sustained an injury to  the left obturator nerve which was immediately repaired.  Her postoperative  course was marked by some nausea and complaints of pain.  However, she was  ambulated with advancement of her diet over the next several days.  Her  suprapubic catheter was draining well, and output had picked up on 31 May 2004.  It was felt that she could be discharged at this time.   FINAL DIAGNOSIS:  Squamous cell carcinoma of the cervix, Stage IB1.   OPERATIONS:  Radical hysterectomy, pelvic lymphadenectomy.  Pathology report  showed residual squamous cell carcinoma of the cervix without extension  beyond the cervix.  All lymph  nodes negative for tumor.   DISPOSITION:  Discharged home to return to the office in approximately 4-5  days to ascertain the status of her suprapubic catheter and to make  arrangements for removal of same.  She is to gradually progress her  activities over several weeks at home and to limit lifting and driving for 2  weeks at home.  She was fully ambulatory, on a regular diet, and in good  condition at the time of discharge.   DISCHARGE MEDICATIONS:  She was given prescriptions for Mepergan Fortis #30  to be taken 1-2 q.4-6h. p.r.n. pain, doxycycline 100 mg #12 to be taken 1  b.Santiago.d., Reglan 10 mg #30 to be taken 1 t.Santiago.d. and Urecholine 25 mg to be  taken 1 q.Santiago.d.   She will call for any problems.      SRF/MEDQ  D:  05/31/2004  T:  06/02/2004  Job:  [id number]   cc:   De Blanch, M.D.

## 2011-01-31 NOTE — Consult Note (Signed)
NAME:  Tracey Santiago, Tracey Santiago               ACCOUNT NO.:  [id number]   MEDICAL RECORD NO.:  [id number]          PATIENT TYPE:  OUT   LOCATION:  GYN                          FACILITY:  [facility]   PHYSICIAN:  De Blanch, M.D.DATE OF BIRTH:  Nov 02, 1955   DATE OF CONSULTATION:  DATE OF DISCHARGE:                                   CONSULTATION   CHIEF COMPLAINT:  Cervical cancer.   INTERVAL HISTORY SINCE HER LAST VISIT:  The patient has done well.  Her only  symptom is that of urinary hesitancy and decreased sensation to the bladder.  She does seem to be having adequate bladder emptying and is not having any  trouble with urinary tract infections.  She continues to work full time.  She denies any pelvic pain, pressure, vaginal bleeding, or discharge.   HISTORY OF PRESENT ILLNESS:  The patient underwent a radical hysterectomy  and pelvic lymphadenectomy in September of 2005 for stage I-B squamous cell  carcinoma of the cervix.  Final pathology showed a small amount of residual  cervical cancer.  Pelvic lymph nodes and surgical margins were free of  disease.   PAST MEDICAL HISTORY:  Medical illnesses:  None.   PAST SURGICAL HISTORY:  1.  Cesarean section.  2.  Radical hysterectomy with pelvic lymphadenectomy.   DRUG ALLERGIES:  None.   CURRENT MEDICATIONS:  Multivitamins.   OBSTETRICAL HISTORY:  Gravida 3.   FAMILY HISTORY:  Negative for gynecologic, breasts, or colon cancer.   SOCIAL HISTORY:  The patient is married.  She works in an Geneticist, molecular and  exercises regularly.   REVIEW OF SYSTEMS:  A 10-point comprehensive review of systems negative  except as noted above.   PHYSICAL EXAMINATION:  VITAL SIGNS:  Weight 136 pounds, blood pressure  120/70, pulse 60, respiratory rate 18.  GENERAL:  The patient is a healthy white female in no acute distress.  HEENT:  Negative.  NECK:  Supple without thyromegaly.  There is no supraclavicular or inguinal  adenopathy.  ABDOMEN:  Soft,  nontender, no masses, organomegaly, ascites, or hernias  noted.  PELVIC EXAM:  __________, BUS, vagina, bladder, and urethra are normal.  Cervix and uterus are surgically absent.  The vaginal cuff is normal, no  lesions are noted.  Bimanual and rectovaginal exam reveal no masses,  induration, or nodularity.  EXTREMITIES:  The lower extremities are without edema or varicosities.   IMPRESSION:  Stage I-B squamous cell carcinoma of the cervix and no evidence  of recurrent disease since September of 2005.   PLAN:  Pap smears were obtained today.  The patient returns to see Dr. Melba Coon in four months and then return to see me six months thereafter.      De Blanch, M.D.  Electronically Signed     DC/MEDQ  D:  02/27/2006  T:  02/27/2006  Job:  [id number]   cc:   Almedia Balls. [name], M.D.  Fax: [phone]   Telford Nab, R.N.  [address]  [place], [place] [address]

## 2011-01-31 NOTE — Consult Note (Signed)
NAME:  Tracey Santiago, Tracey Santiago               ACCOUNT NO.:  [id number]   MEDICAL RECORD NO.:  [id number]          PATIENT TYPE:  OUT   LOCATION:  GYN                          FACILITY:  [facility]   PHYSICIAN:  De Blanch, M.D.DATE OF BIRTH:  May 12, 1956   DATE OF CONSULTATION:  06/04/2005  DATE OF DISCHARGE:  06/04/2005                                   CONSULTATION   CHIEF COMPLAINT:  Cervical cancer.   INTERVAL HISTORY:  Since her last visit, the patient has done well.  She  denies any GI or GU symptoms, has no pelvic pain, pressure, vaginal  bleeding, or discharge.  Functional status is excellent.  She is having no  difficulties with bladder function.  She saw Dr. Jeanine Luz approximately 3  months ago and was doing well at that time as well.   HISTORY OF PRESENT ILLNESS:  The patient underwent a radical hysterectomy  and pelvic lymphadenectomy, September 2005, for a stage IB squamous cell  carcinoma of the cervix.  Final pathology showed a small amount of residual  cervical disease with no evidence of metastases, and all margins were  negative.   PAST MEDICAL HISTORY:   MEDICAL ILLNESSES:  None.   PAST SURGICAL HISTORY:  1.  Cesarean section.  2.  Radical hysterectomy and pelvic lymphadenectomy.   DRUG ALLERGIES:  None.   CURRENT MEDICATIONS:  Multivitamins with iron.   OBSTETRICAL HISTORY:  Gravida 3.   FAMILY HISTORY:  Negative for gynecologic, breast, or colon cancer.   SOCIAL HISTORY:  The patient is married.  She works in an Geneticist, molecular and  exercises regularly.   REVIEW OF SYSTEMS:  A 10-point comprehensive review of systems is reviewed  and is negative except as noted above.   PHYSICAL EXAMINATION:  VITAL SIGNS:  Weight 140 pounds, blood pressure  130/80, pulse 80.  GENERAL:  The patient is a healthy white female in no acute distress.  HEENT:  Negative.  NECK:  Supple without thyromegaly.  There is no supraclavicular or inguinal  adenopathy.  ABDOMEN:  Soft,  nontender.  No mass, organomegaly, ascites, or hernias are  noted.  PELVIC EXAM:  EG//BUS, vagina, bladder, urethra are normal.  Vaginal vault  is well supported, and no lesions are noted.  Bimanual and rectovaginal exam  reveal no masses, induration, or nodularity.  LOWER EXTREMITIES:  Without edema or varicosities.   IMPRESSION:  Stage IB1 squamous cell carcinoma of the cervix, status post  radical hysterectomy September 2005.  No evidence of recurrent disease.   PLAN:  Pap smears are obtained.  We will ask the patient to return to see  Dr. Jeanine Luz in 4 months and return to see [place] in 8 months.      De Blanch, M.D.  Electronically Signed     DC/MEDQ  D:  06/09/2005  T:  06/09/2005  Job:  [id number]   cc:   Almedia Balls. [name], M.D.  Fax: [phone]   Telford Nab, R.N.  [address]  [place], [place] [address]

## 2011-01-31 NOTE — Consult Note (Signed)
NAME:  Tracey Santiago, COLLYMORE               ACCOUNT NO.:  [id number]   MEDICAL RECORD NO.:  [id number]          PATIENT TYPE:  OUT   LOCATION:  GYN                          FACILITY:  [facility]   PHYSICIAN:  De Blanch, M.D.DATE OF BIRTH:  11/24/1955   DATE OF CONSULTATION:  07/17/2004  DATE OF DISCHARGE:                                   CONSULTATION   A 55 year old white female returns for follow-up, having undergone a radical  hysterectomy and pelvic lymphadenectomy May 28, 2004, for a squamous  cell carcinoma of the cervix.  Final pathology showed that 19 lymph nodes  were free of disease.  She had small amount of residual disease in the  cervix, but margins were negative.  The patient's postoperative course was  complicated by obturator nerve injury (left).  The patient is currently  ambulating well, walking approximately 2 miles a day.  She does note that  her leg is somewhat tired by the end of this walk.  She does feel that  strength is returning to the leg.   She also had a postoperative bleed and hematoma under her Pfannenstiel  incision which apparently did not become apparent for approximately a week  after surgery.  This has completely resolved.  She currently denies any  vaginal bleeding or discharge.  Her functional status is improving steadily.   PHYSICAL EXAMINATION:  VITAL SIGNS:  Weight 128 pounds, blood pressure  130/82.  GENERAL:  The patient is a healthy white female in no acute distress.  HEENT:  Negative.  NECK:  Supple without thyromegaly.  There is no supraclavicular or inguinal  adenopathy.  ABDOMEN:  Soft, nontender.  No mass, organomegaly, ascites, or hernias  noted.  Her Pfannenstiel incision is healing well.  The suprapubic site is  also healing well.  PELVIC:  EG/BUS, vagina, bladder, urethra are normal.  The vaginal cuff is  healing nicely.  No lesions are noted.  Bimanual and rectovaginal exam  reveal no masses, induration, or nodularity.   IMPRESSION:  Stage IB1 squamous cell carcinoma of the cervix, status post  radical hysterectomy with very favorable pathologic features.  I would not  recommend any additional therapy.   The patient is given the okay to return to full levels of activity including  resuming sexual intercourse.   With regard to her obturator nerve injury, she will continue to be active,  continue physical therapy, and we will continue to monitor her progress.  She will return to see Dr. Jeanine Luz in 3 months and return to see [place] in 6  months.     [name]   DC/MEDQ  D:  07/17/2004  T:  07/17/2004  Job:  [id number]   cc:   Almedia Balls. Fore, M.D.  [phone] [place]  [place] [address]  Fax: [phone]   Telford Nab, R.N.  [phone] [address]  [place], [place] [address]

## 2011-01-31 NOTE — Consult Note (Signed)
NAME:  Tracey Santiago, Tracey Santiago NO.:  [id number]   MEDICAL RECORD NO.:  [id number]                   PATIENT TYPE:  OUT   LOCATION:  GYN                                  FACILITY:  [facility]   PHYSICIAN:  De Blanch, M.D.         DATE OF BIRTH:  Jan 22, 1956   DATE OF CONSULTATION:  05/01/2004  DATE OF DISCHARGE:                                   CONSULTATION   REFERRING PHYSICIAN:  Almedia Balls. [name], M.D.   HISTORY OF PRESENT ILLNESS:  This is a 55 year old white female seen in  consultation at the request of Dr. Chriss Driver regarding the diagnosis of  newly identified cervical carcinoma.   Review of the patient's records revealed that she had normal Pap  smears up  until between 1995 and 2001.  In 2002 and 2003 she had a Pap smear showing  ascus.  Colposcopy and cervical biopsies were negative in 2003.  Subsequent  Pap smears were benign in 2004 and revealed ascus in March of 2005.  In May  of 2005 the Pap smear was interpreted as high grade SIL.  Colposcopy and  biopsy showed a positive endocervical curettage and the patient underwent a  cold knife conization on April 19, 2004.  Pathology from the cone specimen  revealed an invasive squamous cell carcinoma measuring at least 0.5 cm in  maximum depth and tumor was found to involve the surgical margins of  resection.   The patient had some bleeding following her conization but has been  retreated with __________solution.   PAST MEDICAL HISTORY:  Medical Illnesses:  None.   PAST SURGICAL HISTORY:  Cesarean section.   ALLERGIES:  Drug allergies:  None.   CURRENT MEDICATIONS:  1. Folic acid.  2. Multivitamins with iron.   OBSTETRICAL HISTORY:  Gravida 3.   FAMILY HISTORY:  Negative for gynecologic, breast, or colon cancer.   SOCIAL HISTORY:  The patient is married.  She works in an [employer], [employer] and  smokes only a few cigarettes a day.   REVIEW OF SYSTEMS:  Essentially negative.  The patient  denies any weight  loss, any pelvic pain or pressure, any leg pain or leg edema or any GI or GU  symptoms.   PHYSICAL EXAMINATION:  VITAL SIGNS:  Height 5 feet 8, weight 136 pounds,  blood pressure 132/78, pulse 80, respiratory rate 18.  GENERAL:  The patient is a healthy white female in no acute distress.  HEENT:  Negative.  NECK:  Supple without thyromegaly.  There is no supraclavicular or inguinal  adenopathy.  ABDOMEN:  Soft and nontender.  No mass, organomegaly, ascites, or hernia is  noted.  She has a well-healed Pfannenstiel incision.  PELVIC:  EGBUS, vagina, bladder, and urethra are normal.  The cervix is post  conization.  Suture material and __________solution is still present.  There  is no active bleeding.  The cervix feels hard and in maximum diameter is  approximately 3.5-4 cm.  No gross lesion is noted.  There is no  parametrial  or vaginal involvement.  Rectovaginal exam confirms.  There are no adnexal  masses.   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   I had a lengthy discussion with the patient and her husband regarding  management options and discussed with them both the option for radiation  therapy and the option for radical hysterectomy.  We discussed the pros and  cons of each and the risks and potential benefits of each, understanding  that both therapies have equivalent end results in terms of overall  survival.   The patient indicates she would prefer to undergo radical hysterectomy and  pelvic lymphadenectomy.  We discussed the question regarding preservation or  removal of her ovaries and the patient is undecided at the conclusion of our  discussion, although I encouraged her to strongly consider removal of the  ovaries, given her perimenopausal status.  I would like to allow the cervix  to heal from the conization for six weeks and we will schedule surgery in  conjunction with Dr. Chriss Driver thereafter.   The risks of surgery  including hemorrhage, infection, injury to adjacent  viscera, thromboembolic complications, were discussed at length.  I  specifically discussed with the patient and her husband the risk of bladder  dysfunction following radical hysterectomy and the need for a suprapubic  catheter following surgery.  All other questions were answered.                                               De Blanch, M.D.    DC/MEDQ  D:  05/01/2004  T:  05/01/2004  Job:  [id number]   cc:   Almedia Balls. Fore, M.D.  [phone] [address] [place]  [place] [address]  Fax: [phone]   Telford Nab, R.N.  [phone] [address]  [place], [place] [address]

## 2011-06-03 ENCOUNTER — Other Ambulatory Visit (HOSPITAL_COMMUNITY): Payer: Self-pay | Admitting: Neurological Surgery

## 2011-06-03 DIAGNOSIS — M545 Low back pain, unspecified: Secondary | ICD-10-CM

## 2011-06-17 ENCOUNTER — Ambulatory Visit (HOSPITAL_COMMUNITY)
Admission: RE | Admit: 2011-06-17 | Discharge: 2011-06-17 | Disposition: A | Discharge status: Home / Self Care | Payer: BC Managed Care – PPO | Source: Ambulatory Visit | Attending: Neurological Surgery | Admitting: Neurological Surgery

## 2011-06-17 DIAGNOSIS — M545 Low back pain, unspecified: Secondary | ICD-10-CM

## 2011-06-17 DIAGNOSIS — Q762 Congenital spondylolisthesis: Secondary | ICD-10-CM | POA: Insufficient documentation

## 2011-06-17 DIAGNOSIS — Z79899 Other long term (current) drug therapy: Secondary | ICD-10-CM | POA: Insufficient documentation

## 2011-06-17 DIAGNOSIS — M48061 Spinal stenosis, lumbar region without neurogenic claudication: Secondary | ICD-10-CM | POA: Insufficient documentation

## 2011-06-17 MED ORDER — IOHEXOL 180 MG/ML  SOLN
14.0000 mL | Freq: Once | INTRAMUSCULAR | Status: AC | PRN
  Administered 2011-06-17: 14 mL via INTRATHECAL

## 2011-07-14 ENCOUNTER — Encounter (HOSPITAL_COMMUNITY): Payer: Self-pay

## 2011-07-14 ENCOUNTER — Encounter (HOSPITAL_COMMUNITY)
Admission: RE | Admit: 2011-07-14 | Discharge: 2011-07-14 | Disposition: A | Discharge status: Home / Self Care | Payer: BC Managed Care – PPO | Source: Ambulatory Visit | Attending: Neurological Surgery | Admitting: Neurological Surgery

## 2011-07-14 HISTORY — DX: Nausea with vomiting, unspecified: R11.2

## 2011-07-14 HISTORY — DX: Anxiety disorder, unspecified: F41.9

## 2011-07-14 HISTORY — DX: Other specified postprocedural states: Z98.890

## 2011-07-14 HISTORY — DX: Essential (primary) hypertension: I10

## 2011-07-14 LAB — SURGICAL PCR SCREEN
MRSA, PCR: NEGATIVE
Staphylococcus aureus: NEGATIVE

## 2011-07-14 LAB — CBC
HCT: 38.9 % (ref 36.0–46.0)
Hemoglobin: 13.4 g/dL (ref 12.0–15.0)
MCH: 32.4 pg (ref 26.0–34.0)
MCHC: 34.4 g/dL (ref 30.0–36.0)
MCV: 94 fL (ref 78.0–100.0)
Platelets: 242 10*3/uL (ref 150–400)
RBC: 4.14 MIL/uL (ref 3.87–5.11)
RDW: 12.3 % (ref 11.5–15.5)
WBC: 8.7 10*3/uL (ref 4.0–10.5)

## 2011-07-14 LAB — TYPE AND SCREEN
ABO/RH(D): A POS
Antibody Screen: NEGATIVE

## 2011-07-14 LAB — ABO/RH: ABO/RH(D): A POS

## 2011-07-14 NOTE — Pre-Procedure Instructions (Signed)
20 KHAMYA TOPP  07/14/2011   Your procedure is scheduled on:  Mon,Nov 5th @ 0730  Report to [facility] at 0530 AM.  Call this number if you have problems the morning of surgery: [phone]   Remember:   Do not eat food:After Midnight.  Do not drink clear liquids: 4 Hours before arrival.  Take these medicines the morning of surgery with A SIP OF WATER: Alprazolam   Do not wear jewelry, make-up or nail polish.  Do not wear lotions, powders, or perfumes. You may wear deodorant.  Do not shave 48 hours prior to surgery.  Do not bring valuables to the hospital.  Contacts, dentures or bridgework may not be worn into surgery.  Leave suitcase in the car. After surgery it may be brought to your room.  For patients admitted to the hospital, checkout time is 11:00 AM the day of discharge.   Patients discharged the day of surgery will not be allowed to drive home.  Name and phone number of your driver: Kyley Solow husband [phone]  Special Instructions: CHG Shower Use Special Wash: 1/2 bottle night before surgery and 1/2 bottle morning of surgery.   Please read over the following fact sheets that you were given: Pain Booklet, Coughing and Deep Breathing, Blood Transfusion Information, MRSA Information and Surgical Site Infection Prevention

## 2011-07-15 ENCOUNTER — Encounter (HOSPITAL_COMMUNITY): Payer: Self-pay | Admitting: Pharmacist

## 2011-07-17 ENCOUNTER — Encounter (HOSPITAL_COMMUNITY): Payer: Self-pay

## 2011-07-20 ENCOUNTER — Other Ambulatory Visit: Payer: Self-pay | Admitting: Neurological Surgery

## 2011-07-20 MED ORDER — CEFAZOLIN SODIUM 1-5 GM-% IV SOLN
1.0000 g | INTRAVENOUS | Status: DC
  Filled 2011-07-20: qty 50

## 2011-07-21 ENCOUNTER — Inpatient Hospital Stay (HOSPITAL_COMMUNITY): Payer: BC Managed Care – PPO | Admitting: Certified Registered"

## 2011-07-21 ENCOUNTER — Inpatient Hospital Stay (HOSPITAL_COMMUNITY)
Admission: RE | Admit: 2011-07-21 | Discharge: 2011-07-24 | DRG: 755 | Disposition: A | Discharge status: Home / Self Care | Payer: BC Managed Care – PPO | Source: Ambulatory Visit | Attending: Neurological Surgery | Admitting: Neurological Surgery

## 2011-07-21 ENCOUNTER — Inpatient Hospital Stay (HOSPITAL_COMMUNITY): Payer: BC Managed Care – PPO

## 2011-07-21 ENCOUNTER — Encounter (HOSPITAL_COMMUNITY)
Admission: RE | Disposition: A | Discharge status: Home / Self Care | Payer: Self-pay | Source: Ambulatory Visit | Attending: Neurological Surgery

## 2011-07-21 ENCOUNTER — Encounter (HOSPITAL_COMMUNITY): Payer: Self-pay | Admitting: Certified Registered"

## 2011-07-21 ENCOUNTER — Encounter (HOSPITAL_COMMUNITY): Payer: Self-pay | Admitting: *Deleted

## 2011-07-21 DIAGNOSIS — F411 Generalized anxiety disorder: Secondary | ICD-10-CM | POA: Diagnosis present

## 2011-07-21 DIAGNOSIS — M48062 Spinal stenosis, lumbar region with neurogenic claudication: Secondary | ICD-10-CM | POA: Diagnosis present

## 2011-07-21 DIAGNOSIS — M4716 Other spondylosis with myelopathy, lumbar region: Secondary | ICD-10-CM | POA: Diagnosis present

## 2011-07-21 DIAGNOSIS — M4310 Spondylolisthesis, site unspecified: Principal | ICD-10-CM | POA: Diagnosis present

## 2011-07-21 DIAGNOSIS — Z8541 Personal history of malignant neoplasm of cervix uteri: Secondary | ICD-10-CM

## 2011-07-21 DIAGNOSIS — Z01812 Encounter for preprocedural laboratory examination: Secondary | ICD-10-CM

## 2011-07-21 HISTORY — PX: POSTERIOR LAMINECTOMY / DECOMPRESSION LUMBAR SPINE: SUR740

## 2011-07-21 SURGERY — POSTERIOR LUMBAR FUSION 1 LEVEL
Anesthesia: General | Site: Spine Lumbar

## 2011-07-21 MED ORDER — CALCIUM CARBONATE ANTACID 500 MG PO CHEW
2.0000 | CHEWABLE_TABLET | Freq: Every day | ORAL | Status: DC
  Administered 2011-07-23: 400 mg via ORAL
  Filled 2011-07-21 (×4): qty 2

## 2011-07-21 MED ORDER — VITAMIN C 500 MG PO TABS
1000.0000 mg | ORAL_TABLET | Freq: Every day | ORAL | Status: DC
  Administered 2011-07-23: 1000 mg via ORAL
  Filled 2011-07-21 (×4): qty 2

## 2011-07-21 MED ORDER — SODIUM CHLORIDE 0.9 % IV SOLN: 250.0000 mL | INTRAVENOUS | Status: DC

## 2011-07-21 MED ORDER — MIDAZOLAM HCL 5 MG/5ML IJ SOLN
INTRAMUSCULAR | Status: DC | PRN
  Administered 2011-07-21: 2 mg via INTRAVENOUS

## 2011-07-21 MED ORDER — MENTHOL 3 MG MT LOZG
1.0000 | LOZENGE | OROMUCOSAL | Status: DC | PRN
  Filled 2011-07-21 (×2): qty 9

## 2011-07-21 MED ORDER — MORPHINE SULFATE (PF) 1 MG/ML IV SOLN
INTRAVENOUS | Status: DC
  Administered 2011-07-21: 13:00:00 via INTRAVENOUS
  Filled 2011-07-21: qty 30

## 2011-07-21 MED ORDER — ONDANSETRON HCL 4 MG/2ML IJ SOLN
INTRAMUSCULAR | Status: DC | PRN
  Administered 2011-07-21: 4 mg via INTRAVENOUS

## 2011-07-21 MED ORDER — KETOROLAC TROMETHAMINE 30 MG/ML IJ SOLN
30.0000 mg | Freq: Three times a day (TID) | INTRAMUSCULAR | Status: AC
  Administered 2011-07-21 – 2011-07-22 (×4): 30 mg via INTRAVENOUS
  Filled 2011-07-21 (×5): qty 1

## 2011-07-21 MED ORDER — FENTANYL CITRATE 0.05 MG/ML IJ SOLN
INTRAMUSCULAR | Status: DC | PRN
  Administered 2011-07-21 (×2): 100 ug via INTRAVENOUS

## 2011-07-21 MED ORDER — HETASTARCH-ELECTROLYTES 6 % IV SOLN
INTRAVENOUS | Status: DC | PRN
  Administered 2011-07-21: 10:00:00 via INTRAVENOUS

## 2011-07-21 MED ORDER — DIPHENHYDRAMINE HCL 50 MG/ML IJ SOLN: 12.5000 mg | Freq: Four times a day (QID) | INTRAMUSCULAR | Status: DC | PRN

## 2011-07-21 MED ORDER — POLYETHYLENE GLYCOL 3350 17 GM/SCOOP PO POWD
17.0000 g | ORAL | Status: DC
  Filled 2011-07-21: qty 255

## 2011-07-21 MED ORDER — MORPHINE SULFATE (PF) 1 MG/ML IV SOLN: INTRAVENOUS | Status: DC

## 2011-07-21 MED ORDER — HYDROMORPHONE HCL PF 1 MG/ML IJ SOLN
0.2500 mg | INTRAMUSCULAR | Status: DC | PRN
  Administered 2011-07-21 (×3): 0.5 mg via INTRAVENOUS

## 2011-07-21 MED ORDER — THROMBIN 20000 UNITS EX KIT
PACK | CUTANEOUS | Status: DC | PRN
  Administered 2011-07-21: 10:00:00 via TOPICAL

## 2011-07-21 MED ORDER — ONDANSETRON HCL 4 MG/2ML IJ SOLN: 4.0000 mg | Freq: Once | INTRAMUSCULAR | Status: DC | PRN

## 2011-07-21 MED ORDER — BUPIVACAINE HCL (PF) 0.5 % IJ SOLN
INTRAMUSCULAR | Status: DC | PRN
  Administered 2011-07-21: 5 mL
  Administered 2011-07-21: 20 mL

## 2011-07-21 MED ORDER — OMEGA 3 1200 MG PO CAPS
2.0000 | ORAL_CAPSULE | Freq: Every day | ORAL | Status: DC
  Filled 2011-07-21: qty 2

## 2011-07-21 MED ORDER — LIDOCAINE-EPINEPHRINE 1 %-1:100000 IJ SOLN
INTRAMUSCULAR | Status: DC | PRN
  Administered 2011-07-21: 5 mL

## 2011-07-21 MED ORDER — PHENOL 1.4 % MT LIQD
1.0000 | OROMUCOSAL | Status: DC | PRN
  Administered 2011-07-21: 1 via OROMUCOSAL
  Filled 2011-07-21: qty 177

## 2011-07-21 MED ORDER — METHOCARBAMOL 100 MG/ML IJ SOLN
500.0000 mg | Freq: Four times a day (QID) | INTRAVENOUS | Status: DC | PRN
  Filled 2011-07-21: qty 5

## 2011-07-21 MED ORDER — ACETAMINOPHEN 650 MG RE SUPP: 650.0000 mg | RECTAL | Status: DC | PRN

## 2011-07-21 MED ORDER — MAGNESIUM 30 MG PO TABS
30.0000 mg | ORAL_TABLET | Freq: Every day | ORAL | Status: DC
  Filled 2011-07-21: qty 1

## 2011-07-21 MED ORDER — LACTATED RINGERS IV SOLN
INTRAVENOUS | Status: DC | PRN
  Administered 2011-07-21 (×2): via INTRAVENOUS

## 2011-07-21 MED ORDER — ONDANSETRON HCL 4 MG/2ML IJ SOLN
4.0000 mg | INTRAMUSCULAR | Status: DC | PRN
  Administered 2011-07-21 – 2011-07-22 (×3): 4 mg via INTRAVENOUS
  Filled 2011-07-21 (×3): qty 2

## 2011-07-21 MED ORDER — THROMBIN 20000 UNITS EX KIT: PACK | CUTANEOUS | Status: DC | PRN

## 2011-07-21 MED ORDER — ACETAMINOPHEN 325 MG PO TABS: 650.0000 mg | ORAL_TABLET | ORAL | Status: DC | PRN

## 2011-07-21 MED ORDER — SODIUM CHLORIDE 0.9 % IR SOLN
Status: DC | PRN
  Administered 2011-07-21: 09:00:00

## 2011-07-21 MED ORDER — ALUM & MAG HYDROXIDE-SIMETH 400-400-40 MG/5ML PO SUSP
30.0000 mL | Freq: Four times a day (QID) | ORAL | Status: DC | PRN
  Filled 2011-07-21: qty 30

## 2011-07-21 MED ORDER — MEPERIDINE HCL 25 MG/ML IJ SOLN: 6.2500 mg | INTRAMUSCULAR | Status: DC | PRN

## 2011-07-21 MED ORDER — VITAMIN D 400 UNITS PO TABS
400.0000 [IU] | ORAL_TABLET | Freq: Every day | ORAL | Status: DC
  Administered 2011-07-23: 400 [IU] via ORAL
  Filled 2011-07-21 (×4): qty 1

## 2011-07-21 MED ORDER — ROCURONIUM BROMIDE 100 MG/10ML IV SOLN
INTRAVENOUS | Status: DC | PRN
  Administered 2011-07-21: 50 mg via INTRAVENOUS
  Administered 2011-07-21 (×2): 10 mg via INTRAVENOUS

## 2011-07-21 MED ORDER — SCOPOLAMINE 1 MG/3DAYS TD PT72
MEDICATED_PATCH | TRANSDERMAL | Status: DC | PRN
  Administered 2011-07-21: 1 via TRANSDERMAL

## 2011-07-21 MED ORDER — DEXTROSE IN LACTATED RINGERS 5 % IV SOLN
INTRAVENOUS | Status: DC
  Administered 2011-07-21 (×2): via INTRAVENOUS

## 2011-07-21 MED ORDER — NEOSTIGMINE METHYLSULFATE 1 MG/ML IJ SOLN
INTRAMUSCULAR | Status: DC | PRN
  Administered 2011-07-21: 3 mg via INTRAVENOUS

## 2011-07-21 MED ORDER — GLYCOPYRROLATE 0.2 MG/ML IJ SOLN
INTRAMUSCULAR | Status: DC | PRN
Start: 2011-07-21 — End: 2011-07-21
  Administered 2011-07-21: .6 mg via INTRAVENOUS

## 2011-07-21 MED ORDER — MAGNESIUM CHLORIDE 64 MG PO TBEC
2.0000 | DELAYED_RELEASE_TABLET | Freq: Every day | ORAL | Status: DC
  Filled 2011-07-21 (×5): qty 2

## 2011-07-21 MED ORDER — POLYETHYLENE GLYCOL 3350 17 G PO PACK
17.0000 g | PACK | ORAL | Status: DC
  Filled 2011-07-21: qty 1

## 2011-07-21 MED ORDER — OMEGA-3-ACID ETHYL ESTERS 1 G PO CAPS
2.0000 g | ORAL_CAPSULE | Freq: Every day | ORAL | Status: DC
  Administered 2011-07-23: 2 g via ORAL
  Filled 2011-07-21 (×4): qty 2

## 2011-07-21 MED ORDER — METHOCARBAMOL 500 MG PO TABS
500.0000 mg | ORAL_TABLET | Freq: Four times a day (QID) | ORAL | Status: DC | PRN
  Administered 2011-07-22 (×2): 500 mg via ORAL
  Filled 2011-07-21 (×2): qty 1

## 2011-07-21 MED ORDER — SODIUM CHLORIDE 0.9 % IJ SOLN
3.0000 mL | Freq: Two times a day (BID) | INTRAMUSCULAR | Status: DC
  Administered 2011-07-22 – 2011-07-24 (×3): 3 mL via INTRAVENOUS

## 2011-07-21 MED ORDER — CEFAZOLIN SODIUM 1-5 GM-% IV SOLN
1.0000 g | Freq: Once | INTRAVENOUS | Status: AC
  Administered 2011-07-21: 1 g via INTRAVENOUS
  Filled 2011-07-21: qty 50

## 2011-07-21 MED ORDER — DIPHENHYDRAMINE HCL 12.5 MG/5ML PO ELIX: 12.5000 mg | ORAL_SOLUTION | Freq: Four times a day (QID) | ORAL | Status: DC | PRN

## 2011-07-21 MED ORDER — PROPOFOL 10 MG/ML IV EMUL
INTRAVENOUS | Status: DC | PRN
  Administered 2011-07-21: 200 mg via INTRAVENOUS

## 2011-07-21 MED ORDER — NALOXONE HCL 0.4 MG/ML IJ SOLN: 0.4000 mg | INTRAMUSCULAR | Status: DC | PRN

## 2011-07-21 MED ORDER — ALPRAZOLAM 0.25 MG PO TABS: 0.2500 mg | ORAL_TABLET | Freq: Two times a day (BID) | ORAL | Status: DC | PRN

## 2011-07-21 MED ORDER — DOCUSATE SODIUM 100 MG PO CAPS
100.0000 mg | ORAL_CAPSULE | Freq: Two times a day (BID) | ORAL | Status: DC
  Administered 2011-07-22 – 2011-07-23 (×3): 100 mg via ORAL
  Filled 2011-07-21 (×5): qty 1

## 2011-07-21 MED ORDER — PHENYLEPHRINE HCL 10 MG/ML IJ SOLN
INTRAMUSCULAR | Status: DC | PRN
  Administered 2011-07-21 (×3): 80 ug via INTRAVENOUS
  Administered 2011-07-21: 40 ug via INTRAVENOUS
  Administered 2011-07-21: 80 ug via INTRAVENOUS

## 2011-07-21 MED ORDER — SODIUM CHLORIDE 0.9 % IJ SOLN: 9.0000 mL | INTRAMUSCULAR | Status: DC | PRN

## 2011-07-21 SURGICAL SUPPLY — 64 items
ADH SKN CLS APL DERMABOND .7 (GAUZE/BANDAGES/DRESSINGS) ×1
BAG DECANTER FOR FLEXI CONT (MISCELLANEOUS) ×2 IMPLANT
BLADE SURG ROTATE 9660 (MISCELLANEOUS) IMPLANT
BUR MATCHSTICK NEURO 3.0 LAGG (BURR) ×2 IMPLANT
CANISTER SUCTION 2500CC (MISCELLANEOUS) ×2 IMPLANT
CLOTH BEACON ORANGE TIMEOUT ST (SAFETY) ×2 IMPLANT
CONT SPEC 4OZ CLIKSEAL STRL BL (MISCELLANEOUS) ×4 IMPLANT
COVER BACK TABLE 24X17X13 BIG (DRAPES) IMPLANT
COVER TABLE BACK 60X90 (DRAPES) ×2 IMPLANT
DECANTER SPIKE VIAL GLASS SM (MISCELLANEOUS) ×1 IMPLANT
DERMABOND ADVANCED (GAUZE/BANDAGES/DRESSINGS) ×1
DERMABOND ADVANCED .7 DNX12 (GAUZE/BANDAGES/DRESSINGS) ×1 IMPLANT
DRAPE C-ARM 42X72 X-RAY (DRAPES) ×4 IMPLANT
DRAPE LAPAROTOMY 100X72X124 (DRAPES) ×2 IMPLANT
DRAPE POUCH INSTRU U-SHP 10X18 (DRAPES) ×2 IMPLANT
DRAPE PROXIMA HALF (DRAPES) IMPLANT
DURAPREP 26ML APPLICATOR (WOUND CARE) ×2 IMPLANT
ELECT REM PT RETURN 9FT ADLT (ELECTROSURGICAL) ×2
ELECTRODE REM PT RTRN 9FT ADLT (ELECTROSURGICAL) ×1 IMPLANT
GAUZE SPONGE 4X4 16PLY XRAY LF (GAUZE/BANDAGES/DRESSINGS) ×1 IMPLANT
GLOVE BIO SURGEON STRL SZ8.5 (GLOVE) ×1 IMPLANT
GLOVE BIOGEL PI IND STRL 8.5 (GLOVE) ×2 IMPLANT
GLOVE BIOGEL PI INDICATOR 8.5 (GLOVE) ×3
GLOVE ECLIPSE 8.5 STRL (GLOVE) ×5 IMPLANT
GLOVE EXAM NITRILE LRG STRL (GLOVE) IMPLANT
GLOVE EXAM NITRILE MD LF STRL (GLOVE) ×1 IMPLANT
GLOVE EXAM NITRILE XL STR (GLOVE) IMPLANT
GLOVE EXAM NITRILE XS STR PU (GLOVE) IMPLANT
GLOVE SS BIOGEL STRL SZ 8 (GLOVE) IMPLANT
GLOVE SUPERSENSE BIOGEL SZ 8 (GLOVE) ×1
GOWN BRE IMP SLV AUR LG STRL (GOWN DISPOSABLE) IMPLANT
GOWN BRE IMP SLV AUR XL STRL (GOWN DISPOSABLE) IMPLANT
GOWN STRL REIN 2XL LVL4 (GOWN DISPOSABLE) ×6 IMPLANT
GRAFT BONE DELIVERY SYR (SYRINGE) ×1 IMPLANT
KIT BASIN OR (CUSTOM PROCEDURE TRAY) ×2 IMPLANT
KIT ROOM TURNOVER OR (KITS) ×2 IMPLANT
NDL SPNL 18GX3.5 QUINCKE PK (NEEDLE) IMPLANT
NEEDLE HYPO 22GX1.5 SAFETY (NEEDLE) ×2 IMPLANT
NEEDLE SPNL 18GX3.5 QUINCKE PK (NEEDLE) ×2 IMPLANT
NS IRRIG 1000ML POUR BTL (IV SOLUTION) ×2 IMPLANT
PACK FOAM VITOSS 10CC (Orthopedic Implant) ×1 IMPLANT
PACK LAMINECTOMY NEURO (CUSTOM PROCEDURE TRAY) ×2 IMPLANT
PAD ARMBOARD 7.5X6 YLW CONV (MISCELLANEOUS) ×6 IMPLANT
PATTIES SURGICAL .5 X1 (DISPOSABLE) ×2 IMPLANT
PEEK PLIF NOVEL 9X25X12 (Peek) ×2 IMPLANT
PUREGEN 2.0ML LRG (Bone Implant) ×1 IMPLANT
ROD 35MM (Rod) ×2 IMPLANT
SCREW 40MM (Screw) ×2 IMPLANT
SCREW BONE SPINE 30MM (Screw) ×2 IMPLANT
SCREW SET SPINAL STD HEXALOBE (Screw) ×4 IMPLANT
SPONGE GAUZE 4X4 12PLY (GAUZE/BANDAGES/DRESSINGS) ×1 IMPLANT
SPONGE LAP 4X18 X RAY DECT (DISPOSABLE) IMPLANT
SPONGE SURGIFOAM ABS GEL 100 (HEMOSTASIS) ×2 IMPLANT
SUT VIC AB 1 CT1 18XBRD ANBCTR (SUTURE) ×1 IMPLANT
SUT VIC AB 1 CT1 8-18 (SUTURE) ×4
SUT VIC AB 2-0 CP2 18 (SUTURE) ×3 IMPLANT
SUT VIC AB 3-0 SH 8-18 (SUTURE) ×2 IMPLANT
SYR 20ML ECCENTRIC (SYRINGE) ×2 IMPLANT
SYR 5ML LL (SYRINGE) ×1 IMPLANT
TOWEL OR 17X24 6PK STRL BLUE (TOWEL DISPOSABLE) ×2 IMPLANT
TOWEL OR 17X26 10 PK STRL BLUE (TOWEL DISPOSABLE) ×2 IMPLANT
TRAP SPECIMEN MUCOUS 40CC (MISCELLANEOUS) ×2 IMPLANT
TRAY FOLEY CATH 14FRSI W/METER (CATHETERS) ×2 IMPLANT
WATER STERILE IRR 1000ML POUR (IV SOLUTION) ×2 IMPLANT

## 2011-07-21 NOTE — Progress Notes (Signed)
Subjective: Patient reports She is feeling well.  Objective: Vital signs in last 24 hours: Temp:  [97.5 F (36.4 C)-98.7 F (37.1 C)] 98.7 F (37.1 C) (11/05 1354) Pulse Rate:  [66-70] 66  (11/05 1354) Resp:  [16-18] 16  (11/05 1600) BP: (120-143)/(68-71) 143/71 mmHg (11/05 1354) SpO2:  [96 %-97 %] 96 % (11/05 1600) Weight:  [60.782 kg (134 lb)] 134 lb (60.782 kg) (11/05 0618)  Intake/Output from previous day:   Intake/Output this shift: Total I/O In: 2300 [I.V.:1800; IV Piggyback:500] Out: 1050 [Urine:800; Blood:250]  Motor function good in lower extremities. Dressing dry.  Lab Results: No results found for this basename: WBC:2,HGB:2,HCT:2,PLT:2 in the last 72 hours BMET No results found for this basename: NA:2,K:2,CL:2,CO2:2,GLUCOSE:2,BUN:2,CREATININE:2,CALCIUM:2 in the last 72 hours  Studies/Results: Dg Lumbar Spine 2-3 Views  07/21/2011  *RADIOLOGY REPORT*  Clinical Data: 55 year old female undergoing lumbar surgery.  LUMBAR SPINE - 2-3 VIEW  Comparison: Intraoperative radiographs 8:40 hours the same day and earlier.  Findings: Intraoperative fluoroscopic views of the lower lumbar spine in the frontal and lateral projection.  The same numbering system is used as on the comparison.  Bilateral transpedicular L4 and L5 hardware.  L4-L5 interbody fusion hardware.  Pelvic sidewall clips also noted.  IMPRESSION: Posterior and interbody fusion depicted at L4-L5.  Original Report Authenticated By: Harley Hallmark, M.D.   Dg Lumbar Spine 1 View  07/21/2011  *RADIOLOGY REPORT*  Clinical Data: Spinal stenosis and 04/05.  LUMBAR SPINE - 1 VIEW  Comparison: CT myelogram dated 06/17/2011  Findings: Lateral portable radiograph demonstrates instruments at the L3-4 and L4-5 levels.  IMPRESSION: Instruments at L3-4 and L4-5.  Original Report Authenticated By: Gwynn Burly, M.D.   Dg C-arm 1-60 Min  07/21/2011  CLINICAL DATA: L 4-5 PLIF   C-ARM 1-60 MINUTES  Fluoroscopy was utilized by the  requesting physician.  No radiographic  interpretation.      Assessment/Plan: stable post op  LOS: 0 days     Jamonica Schoff J 07/21/2011, 5:55 PM

## 2011-07-21 NOTE — Anesthesia Procedure Notes (Addendum)
Procedure Name: Intubation Performed by: CREWS, DAVID A Pre-anesthesia Checklist: Patient identified, Patient being monitored, Suction available and Emergency Drugs available Patient Re-evaluated:Patient Re-evaluated prior to inductionOxygen Delivery Method: Circle System Utilized Preoxygenation: Pre-oxygenation with 100% oxygen Intubation Type: IV induction Ventilation: Mask ventilation without difficulty Laryngoscope Size: Miller and 2 Grade View: Grade II Tube type: Oral Tube size: 7.5 mm Number of attempts: 1 Placement Confirmation: positive ETCO2,  breath sounds checked- equal and bilateral and ETT inserted through vocal cords under direct vision Dental Injury: Teeth and Oropharynx as per pre-operative assessment

## 2011-07-21 NOTE — Transfer of Care (Signed)
Immediate Anesthesia Transfer of Care Note  Patient: Tracey Santiago  Procedure(s) Performed:  POSTERIOR LUMBAR FUSION 1 LEVEL - Lumbar four-five decompression, posterior lumbar interbody fusion with  peek spacers and pedicle screws  Patient Location: PACU  Anesthesia Type: General  Level of Consciousness: awake, alert  and oriented  Airway & Oxygen Therapy: Patient Spontanous Breathing and Patient connected to nasal cannula oxygen  Post-op Assessment: Report given to PACU RN, Post -op Vital signs reviewed and stable and Patient moving all extremities X 4  Post vital signs: Reviewed  Complications: No apparent anesthesia complications

## 2011-07-21 NOTE — Anesthesia Preprocedure Evaluation (Addendum)
Anesthesia Evaluation  Patient identified by MRN, date of birth, ID band Patient awake    Reviewed: Allergy & Precautions, NPO status   History of Anesthesia Complications (+) PONVHistory of anesthetic complications: PONV for a few days following a GYN surgery.  Airway Mallampati: I TM Distance: >3 FB Neck ROM: full    Dental No notable dental hx.    Pulmonary  clear to auscultation  Pulmonary exam normal       Cardiovascular neg cardio ROS     Neuro/Psych    GI/Hepatic negative GI ROS,   Endo/Other    Renal/GU      Musculoskeletal   Abdominal   Peds  Hematology   Anesthesia Other Findings   Reproductive/Obstetrics History of cervical cancer                      Anesthesia Physical Anesthesia Plan  ASA: I  Anesthesia Plan: General   Post-op Pain Management:    Induction: Intravenous  Airway Management Planned: Oral ETT  Additional Equipment:   Intra-op Plan:   Post-operative Plan: Extubation in OR  Informed Consent: I have reviewed the patients History and Physical, chart, labs and discussed the procedure including the risks, benefits and alternatives for the proposed anesthesia with the patient or authorized representative who has indicated his/her understanding and acceptance.   Dental advisory given  Plan Discussed with: CRNA and Surgeon  Anesthesia Plan Comments:         Anesthesia Quick Evaluation

## 2011-07-21 NOTE — Op Note (Signed)
Procedure: L4-L5 decompressive laminectomy decompression of L4 and L5 nerve roots, posterior lumbar interbody arthrodesis with peek spacers local autograft and allograft, pedicle screw fixation L4-L5, posterior lateral arthrodesis L4-L5  Surgeon: Barnett Abu M.D.  Asst.: Tressie Stalker M.D.  Indications: Patient is a 55 year old white female who who's had significant back pain and lumbar radiculopathy for over a years period time. A lumbar myelogram demonstrates advanced spondylolisthesis with high-grade canal stenosis. She was advised regarding surgical intervention.  Procedure: The patient was brought to the operating room supine on a stretcher. After the smooth induction of general endotracheal anesthesia she was turned prone and the back was prepped with alcohol and DuraPrep. The back was then draped sterilely. A midline incision was created and carried down to the lumbar dorsal fascia. A localizing radiograph identified the L4 and L5 spinous processes. A subligamentous dissection was created at L4 and L5 to expose the interlaminar space at L4 and L5 and the facet joints over the L4-L5 interspace. Laminotomies were were then created removing the entire inferior margin of the lamina of L4 including the inferior facet at the L4-L5 joint. The yellow ligament was taken up and the common dural tube was exposed along with the L4 nerve root superiorly, and the L5 nerve root inferiorly, the disc space was exposed and epidural veins in this region were cauterized and divided. The L4 nerve roots and the L5 nerve root were dissected with care taken to protect them. The disc space was opened and a combination of curettes and rongeurs was used to evacuate the disc space fully. The endplates were removed using sharp curettes. An interbody spacer was placed to distract the disc space while the contralateral discectomy was performed. When the entirety of the disc was removed and the endplates were prepared final  sizing of the disc space was obtained 12 mm peek spacers were chosen and packed with autograft and allograft and placed into the interspace. The remainder of the interspace was packed with autograft and allograft. Pedicle entry sites were then chosen using fluoroscopic guidance and 5.5 x 30 mm screws were placed in L4 and 6.5 x 40 mm screws were placed in L5. The lateral gutters were decorticated and graft was packed in the posterolateral gutters between L4 and L5. Final radiographs were obtained after placing appropriately sized rods between the pedicle screws at L4-L5 and torquing these to the appropriate tension. The surgical site was inspected carefully to assure the L4 and L5 nerve roots were well decompressed, hemostasis was obtained, and the graft was well packed. Then the retractors were removed and the wound was closed with #1 Vicryl in the lumbar dorsal fascia 2-0 Vicryl in the subcutaneous tissue and 3-0 Vicryl subcuticularly. When he cc of half percent Marcaine was injected into the paraspinous musculature at the time of closure. Blood loss was estimated at 250 cc. The patient tolerated procedure well and was returned to the recovery room in stable condition.

## 2011-07-21 NOTE — H&P (Deleted)
  Tracey Santiago has had back pain with radiation into both lower extremities for over a years period of time. During that time she has had all forms of conservative treatment and has failed them all with progressively worsening symptoms.  A myelogram performed on 06/17/11 demonstrates severe stenosis of L4-5 with acquired spondylolisthesis. She has been advised regarding surgery, and is now admitted for this purpose. She will undergo decompression of L4-5 and interbody arthrodesis with PEEK spacers and pedicle screw fixation.

## 2011-07-21 NOTE — Anesthesia Postprocedure Evaluation (Signed)
  Anesthesia Post-op Note  Patient: Tracey Santiago  Procedure(s) Performed:  POSTERIOR LUMBAR FUSION 1 LEVEL - Lumbar four-five decompression, posterior lumbar interbody fusion with  peek spacers and pedicle screws  Patient Location: PACU  Anesthesia Type: General  Level of Consciousness: awake and alert   Airway and Oxygen Therapy: Patient Spontanous Breathing  Post-op Pain: mild  Post-op Assessment: Post-op Vital signs reviewed  Post-op Vital Signs: Reviewed  Complications: No apparent anesthesia complications

## 2011-07-21 NOTE — H&P (Addendum)
Tracey Santiago is an 55 y.o. female.   Chief Complaint:centralized back pain HPI:  Tracey Santiago is a 55 year old right-handed white female who has had significant back pain for over years period of time. She has tried all manner of conservative therapy having failed them all this past summer the pain became much more severe. Pain radiates through the back into the buttocks and hips she complains of numbness and tingling she has not been sleeping or resting well    a myelogram was performed on 06/17/2011. Study demonstrates that the patient has severe spondylolisthesis at the level of L4-L5. There is a high-grade stenosis at this level. I discussed the findings with the patient on 06/20/2011 and recommended surgical decompression and arthrodesis at the  L4-L5. She is now admitted for this procedure.  Past Medical History  Diagnosis Date  . Anxiety     related to surgeries  . Post-operative nausea and vomiting     pt states she couldn't eat for 10days after hysterectomy   . Cancer     cervical cancer in 2005  . Carpal tunnel syndrome, left     numbness/tingling on left;pt states on Oct 16th received an epidural injections    Past Surgical History  Procedure Date  . Abdominal hysterectomy     2005 at [facility]  . Cesarean section   . Tonsillectomy     No family history on file. Social History:  reports that she quit smoking about 15 years ago. She does not have any smokeless tobacco history on file. She reports that she drinks about .6 ounces of alcohol per week. Her drug history not on file.  Allergies: No Known Allergies  Medications Prior to Admission  Medication Dose Route Frequency Provider Last Rate Last Dose  . ceFAZolin (ANCEF) IVPB 1 g/50 mL premix  1 g Intravenous Once Stefani Dama      . DISCONTD: ceFAZolin (ANCEF) IVPB 1 g/50 mL premix  1 g Intravenous 60 min Pre-Op Shary Key Yula Crotwell       Medications Prior to Admission  Medication Sig Dispense Refill  . ALPRAZolam (XANAX)  0.5 MG tablet Take 0.25-0.5 mg by mouth every 12 (twelve) hours as needed. for anxiety      . Ascorbic Acid (VITAMIN C) 1000 MG tablet Take 1,000 mg by mouth daily.        . calcium carbonate (TUMS - DOSED IN MG ELEMENTAL CALCIUM) 500 MG chewable tablet Chew 2 tablets by mouth daily.        Marland Kitchen ibuprofen (ADVIL,MOTRIN) 200 MG tablet Take 400 mg by mouth every 8 (eight) hours as needed. for back pain       . MAGNESIUM PO Take 2 tablets by mouth daily.        . OMEGA 3 1200 MG CAPS Take 2 capsules by mouth daily.        . polyethylene glycol powder (GLYCOLAX/MIRALAX) powder Take 17 g by mouth every other day.        . vitamin D, CHOLECALCIFEROL, 400 UNITS tablet Take 400 Units by mouth daily.        Marland Kitchen zolpidem (AMBIEN) 10 MG tablet Take 10 mg by mouth at bedtime as needed. for sleep         No results found for this or any previous visit (from the past 48 hour(s)). No results found.  Review of Systems  Constitutional: Negative.   HENT: Negative.   Eyes: Negative.   Respiratory: Negative.  Negative for sputum  production.   Gastrointestinal: Negative.   Genitourinary: Negative.   Musculoskeletal: Positive for back pain.  Skin: Negative.   Neurological: Positive for tingling.  Endo/Heme/Allergies: Negative.   Psychiatric/Behavioral: Negative.     Blood pressure 120/68, pulse 70, temperature 97.5 F (36.4 C), temperature source Oral, resp. rate 18, height 5' 7.5" (1.715 m), weight 60.782 kg (134 lb), SpO2 97.00%. Physical Exam  Constitutional: She appears well-developed and well-nourished.  HENT:  Head: Normocephalic and atraumatic.  Eyes: EOM are normal. Pupils are equal, round, and reactive to light.  Neck: Normal range of motion. Neck supple. No tracheal deviation present. No thyromegaly present.  Cardiovascular: Normal rate, regular rhythm and normal heart sounds.   Respiratory: Effort normal and breath sounds normal.  GI: Soft. Bowel sounds are normal. She exhibits no distension.    Musculoskeletal:       Positive strait leg raising at 30 degrees bilaterally. Central low back pain to palpation and percusssion.  Neurological: She displays abnormal reflex (absent achilles reflex). No cranial nerve deficit. Coordination normal.  Skin: Skin is warm and dry.  Psychiatric: She has a normal mood and affect. Her behavior is normal.     Assessment/Plan  the patient is being admitted to undergo surgical decompression at L4-L5. The risks benefits and alternatives to the surgery have been discussed at length with the patient is agreeable to proceeding with surgical decompression is planned  Baneza Bartoszek J 07/21/2011, 7:13 AM

## 2011-07-21 NOTE — Progress Notes (Deleted)
Inserted @ [phone]

## 2011-07-22 MED ORDER — PROMETHAZINE HCL 25 MG PO TABS
25.0000 mg | ORAL_TABLET | Freq: Four times a day (QID) | ORAL | Status: DC | PRN
Start: 2011-07-22 — End: 2011-07-24
  Administered 2011-07-22 – 2011-07-23 (×4): 25 mg via ORAL
  Filled 2011-07-22 (×4): qty 1

## 2011-07-22 MED ORDER — OXYCODONE-ACETAMINOPHEN 5-325 MG PO TABS
1.0000 | ORAL_TABLET | ORAL | Status: DC | PRN
  Administered 2011-07-22 – 2011-07-23 (×4): 1 via ORAL
  Filled 2011-07-22 (×4): qty 1

## 2011-07-22 NOTE — Progress Notes (Signed)
Subjective: Patient reports reasonalbly comfortable. Some sorenss in back. Legs wobbly.   Objective: Vital signs in last 24 hours: Temp:  [98.3 F (36.8 C)-99.5 F (37.5 C)] 99.5 F (37.5 C) (11/06 1420) Pulse Rate:  [75-86] 80  (11/06 1420) Resp:  [16-20] 16  (11/06 1420) BP: (117-143)/(64-71) 117/69 mmHg (11/06 1420) SpO2:  [95 %-100 %] 95 % (11/06 1420)  Intake/Output from previous day: 11/05 0701 - 11/06 0700 In: 2300 [I.V.:1800; IV Piggyback:500] Out: 3951 [Urine:3700; Stool:1; Blood:250] Intake/Output this shift: Total I/O In: -  Out: 1 [Urine:1]  Ambulating independently, dressing dry  Lab Results: No results found for this basename: WBC:2,HGB:2,HCT:2,PLT:2 in the last 72 hours BMET No results found for this basename: NA:2,K:2,CL:2,CO2:2,GLUCOSE:2,BUN:2,CREATININE:2,CALCIUM:2 in the last 72 hours  Studies/Results: Dg Lumbar Spine 2-3 Views  07/21/2011  *RADIOLOGY REPORT*  Clinical Data: 55 year old female undergoing lumbar surgery.  LUMBAR SPINE - 2-3 VIEW  Comparison: Intraoperative radiographs 8:40 hours the same day and earlier.  Findings: Intraoperative fluoroscopic views of the lower lumbar spine in the frontal and lateral projection.  The same numbering system is used as on the comparison.  Bilateral transpedicular L4 and L5 hardware.  L4-L5 interbody fusion hardware.  Pelvic sidewall clips also noted.  IMPRESSION: Posterior and interbody fusion depicted at L4-L5.  Original Report Authenticated By: Harley Hallmark, M.D.   Dg Lumbar Spine 1 View  07/21/2011  *RADIOLOGY REPORT*  Clinical Data: Spinal stenosis and 04/05.  LUMBAR SPINE - 1 VIEW  Comparison: CT myelogram dated 06/17/2011  Findings: Lateral portable radiograph demonstrates instruments at the L3-4 and L4-5 levels.  IMPRESSION: Instruments at L3-4 and L4-5.  Original Report Authenticated By: Gwynn Burly, M.D.   Dg C-arm 1-60 Min  07/21/2011  CLINICAL DATA: L 4-5 PLIF   C-ARM 1-60 MINUTES  Fluoroscopy  was utilized by the requesting physician.  No radiographic  interpretation.      Assessment/Plan: Doing well POD 1  LOS: 1 day  Possible dc tomorrow.   Finnbar Cedillos J 07/22/2011, 6:44 PM

## 2011-07-22 NOTE — Progress Notes (Signed)
No dsg noted. Dermabond.

## 2011-07-22 NOTE — Progress Notes (Addendum)
Pt doing well, ambulated in hallway with minimal assistance. Pain controlled on oral medications well. Pt having nausea @ times, controlled well with phenergan well.

## 2011-07-23 MED ORDER — PROMETHAZINE HCL 25 MG PO TABS: 25.0000 mg | ORAL_TABLET | Freq: Four times a day (QID) | ORAL | Status: DC | PRN

## 2011-07-23 MED ORDER — HYDROCODONE-ACETAMINOPHEN 5-325 MG PO TABS
1.0000 | ORAL_TABLET | ORAL | Status: DC | PRN
  Administered 2011-07-23 (×3): 1 via ORAL
  Administered 2011-07-24: 2 via ORAL
  Administered 2011-07-24: 1 via ORAL
  Filled 2011-07-23: qty 1
  Filled 2011-07-23 (×2): qty 2
  Filled 2011-07-23 (×2): qty 1

## 2011-07-23 MED ORDER — PROMETHAZINE HCL 25 MG/ML IJ SOLN
25.0000 mg | INTRAMUSCULAR | Status: DC | PRN
  Filled 2011-07-23: qty 1

## 2011-07-23 MED ORDER — HYDROCODONE-ACETAMINOPHEN 5-325 MG PO TABS: 1.0000 | ORAL_TABLET | ORAL | Status: AC | PRN

## 2011-07-23 NOTE — Discharge Summary (Addendum)
Physician Discharge Summary  Patient ID: Tracey Santiago MRN: [id number] DOB/AGE: 1956-05-23 55 y.o.  Admit date: 07/21/2011 Discharge date: 07/24/2011  Admission Diagnoses:Acquired Spondylolisthesis  Discharge Diagnoses:  Active Problems:  Acquired spondylolisthesis  Spinal stenosis, lumbar region, with neurogenic claudication   Discharged Condition: good  Hospital Course: Patient admitted for surgery. To decompress and stabilize spondylolisthesis. Tolerated surgery well. Nausea post op abated spontaneously.    Consults: none  Significant Diagnostic Studies:   Treatments: surgery: Lumbar Laminectomy L4-5 Decompression of L4 and L5 nerve roots. Stabilization L4-5 with pedicle screws. Fusion with peek spacers. L4-5  Discharge Exam: Blood pressure 123/76, pulse 87, temperature 98.3 F (36.8 C), temperature source Oral, resp. rate 16, height 5' 7.5" (1.715 m), weight 60.782 kg (134 lb), SpO2 97.00%. Incision clean and dry. Motor function normal. No sensory abnormalities.  Disposition:   Discharge Orders    Future Orders Please Complete By Expires   Diet - low sodium heart healthy      Increase activity slowly      Comments:   Ambulate several times daily as tolerated    Driving Restrictions      Comments:   Resume driving when not requiring pain medication.   Lifting restrictions      Comments:   Lifting maximum 10 pounds.   No dressing needed      Comments:   Ok to shower. Do not soak in bath tub.   Call MD for:  temperature >100.4      Call MD for:  persistant nausea and vomiting      Call MD for:  severe uncontrolled pain      Call MD for:  redness, tenderness, or signs of infection (pain, swelling, redness, odor or green/yellow discharge around incision site)        Current Discharge Medication List    START taking these medications   Details  HYDROcodone-acetaminophen (NORCO) 5-325 MG per tablet Take 1-2 tablets by mouth every 4 (four) hours as needed for  pain. Qty: 60 tablet, Refills: 0      CONTINUE these medications which have NOT CHANGED   Details  ALPRAZolam (XANAX) 0.5 MG tablet Take 0.25-0.5 mg by mouth every 12 (twelve) hours as needed. for anxiety    Ascorbic Acid (VITAMIN C) 1000 MG tablet Take 1,000 mg by mouth daily.      calcium carbonate (TUMS - DOSED IN MG ELEMENTAL CALCIUM) 500 MG chewable tablet Chew 2 tablets by mouth daily.      MAGNESIUM PO Take 2 tablets by mouth daily.     OMEGA 3 1200 MG CAPS Take 2 capsules by mouth daily.      polyethylene glycol powder (GLYCOLAX/MIRALAX) powder Take 17 g by mouth every other day.      vitamin D, CHOLECALCIFEROL, 400 UNITS tablet Take 400 Units by mouth daily.      zolpidem (AMBIEN) 10 MG tablet Take 10 mg by mouth at bedtime as needed. for sleep       STOP taking these medications     ibuprofen (ADVIL,MOTRIN) 200 MG tablet        Follow-up Information    Follow up with [name] Make an appointment in 3 weeks. (call Aram Beecham for appointment)    Contact information:   [address], [address] [place] [address] [phone]          Signed: Stefani Dama 07/23/2011, 7:32 PM

## 2011-07-24 MED FILL — Electrolyte-R (PH 7.4) Solution: INTRAVENOUS | Qty: 1000 | Status: AC

## 2011-07-24 MED FILL — Heparin Sodium (Porcine) Inj 1000 Unit/ML: INTRAMUSCULAR | Qty: 30 | Status: AC

## 2011-07-24 NOTE — Plan of Care (Signed)
Problem: Consults Goal: Spinal Surgery Patient Education See Patient Education Module for education specifics.  Outcome: Adequate for Discharge Patient educated on wearing brace at all time

## 2011-07-24 NOTE — Progress Notes (Signed)
Utilization review completed. Makhai Fulco, RN, BSN. 07/24/11 

## 2011-07-24 NOTE — Progress Notes (Signed)
Explained d/c instructions to the patient and her husband. IV was removed with catheter intact. She was given two prescriptions, one for pain and nausea. Care notes were also given. She was educated on incision care and s/s of infection to report to doctors. She know to make her fu appts. No distress noted. Awaiting [facility] for transport downstairs for d/c

## 2011-07-24 NOTE — Plan of Care (Signed)
Problem: Consults Goal: Diagnosis - Spinal Surgery Outcome: Completed/Met Date Met:  07/24/11 [facility]/Lumbar Spine Fusion

## 2011-10-30 ENCOUNTER — Other Ambulatory Visit: Payer: Self-pay | Admitting: Orthopedic Surgery

## 2011-11-03 ENCOUNTER — Encounter (HOSPITAL_BASED_OUTPATIENT_CLINIC_OR_DEPARTMENT_OTHER): Payer: Self-pay | Admitting: *Deleted

## 2011-11-06 MED ORDER — DIPHENHYDRAMINE HCL 50 MG/ML IJ SOLN: 12.5000 mg | Freq: Four times a day (QID) | INTRAMUSCULAR | Status: DC | PRN

## 2011-11-06 MED ORDER — MORPHINE SULFATE 4 MG/ML IJ SOLN: 0.0500 mg/kg | INTRAMUSCULAR | Status: DC | PRN

## 2011-11-06 MED ORDER — SODIUM CHLORIDE 0.9 % IJ SOLN: 9.0000 mL | INTRAMUSCULAR | Status: DC | PRN

## 2011-11-06 MED ORDER — NALOXONE HCL 0.4 MG/ML IJ SOLN: 0.4000 mg | INTRAMUSCULAR | Status: DC | PRN

## 2011-11-06 MED ORDER — ONDANSETRON HCL 4 MG/2ML IJ SOLN: 4.0000 mg | Freq: Once | INTRAMUSCULAR | Status: DC | PRN

## 2011-11-06 MED ORDER — DIPHENHYDRAMINE HCL 12.5 MG/5ML PO ELIX: 12.5000 mg | ORAL_SOLUTION | Freq: Four times a day (QID) | ORAL | Status: DC | PRN

## 2011-11-06 MED ORDER — MEPERIDINE HCL 25 MG/ML IJ SOLN: 6.2500 mg | INTRAMUSCULAR | Status: DC | PRN

## 2011-11-06 MED ORDER — HYDROMORPHONE HCL PF 1 MG/ML IJ SOLN: 0.2500 mg | INTRAMUSCULAR | Status: DC | PRN

## 2011-11-07 ENCOUNTER — Ambulatory Visit (HOSPITAL_BASED_OUTPATIENT_CLINIC_OR_DEPARTMENT_OTHER)
Admission: RE | Admit: 2011-11-07 | Discharge: 2011-11-07 | Disposition: A | Discharge status: Home / Self Care | Payer: BC Managed Care – PPO | Source: Ambulatory Visit | Attending: Orthopedic Surgery | Admitting: Orthopedic Surgery

## 2011-11-07 ENCOUNTER — Encounter (HOSPITAL_BASED_OUTPATIENT_CLINIC_OR_DEPARTMENT_OTHER): Payer: Self-pay | Admitting: Anesthesiology

## 2011-11-07 ENCOUNTER — Ambulatory Visit (HOSPITAL_BASED_OUTPATIENT_CLINIC_OR_DEPARTMENT_OTHER): Payer: BC Managed Care – PPO | Admitting: Anesthesiology

## 2011-11-07 ENCOUNTER — Encounter (HOSPITAL_BASED_OUTPATIENT_CLINIC_OR_DEPARTMENT_OTHER)
Admission: RE | Disposition: A | Discharge status: Home / Self Care | Payer: Self-pay | Source: Ambulatory Visit | Attending: Orthopedic Surgery

## 2011-11-07 ENCOUNTER — Encounter (HOSPITAL_BASED_OUTPATIENT_CLINIC_OR_DEPARTMENT_OTHER): Payer: Self-pay | Admitting: *Deleted

## 2011-11-07 DIAGNOSIS — Z8541 Personal history of malignant neoplasm of cervix uteri: Secondary | ICD-10-CM | POA: Insufficient documentation

## 2011-11-07 DIAGNOSIS — G5600 Carpal tunnel syndrome, unspecified upper limb: Secondary | ICD-10-CM | POA: Insufficient documentation

## 2011-11-07 DIAGNOSIS — M129 Arthropathy, unspecified: Secondary | ICD-10-CM | POA: Insufficient documentation

## 2011-11-07 DIAGNOSIS — Z87891 Personal history of nicotine dependence: Secondary | ICD-10-CM | POA: Insufficient documentation

## 2011-11-07 HISTORY — PX: CARPAL TUNNEL RELEASE: SHX101

## 2011-11-07 HISTORY — DX: Unspecified osteoarthritis, unspecified site: M19.90

## 2011-11-07 SURGERY — CARPAL TUNNEL RELEASE
Anesthesia: General | Site: Hand | Laterality: Left | Wound class: Clean

## 2011-11-07 MED ORDER — HYDROMORPHONE HCL PF 1 MG/ML IJ SOLN
0.2500 mg | INTRAMUSCULAR | Status: DC | PRN
  Administered 2011-11-07 (×2): 0.5 mg via INTRAVENOUS

## 2011-11-07 MED ORDER — FENTANYL CITRATE 0.05 MG/ML IJ SOLN
INTRAMUSCULAR | Status: DC | PRN
  Administered 2011-11-07 (×2): 50 ug via INTRAVENOUS

## 2011-11-07 MED ORDER — LACTATED RINGERS IV SOLN
INTRAVENOUS | Status: DC
  Administered 2011-11-07 (×2): via INTRAVENOUS

## 2011-11-07 MED ORDER — LIDOCAINE HCL (CARDIAC) 20 MG/ML IV SOLN
INTRAVENOUS | Status: DC | PRN
  Administered 2011-11-07: 80 mg via INTRAVENOUS

## 2011-11-07 MED ORDER — ONDANSETRON HCL 4 MG/2ML IJ SOLN: 4.0000 mg | Freq: Four times a day (QID) | INTRAMUSCULAR | Status: DC | PRN

## 2011-11-07 MED ORDER — ONDANSETRON HCL 4 MG/2ML IJ SOLN
INTRAMUSCULAR | Status: DC | PRN
  Administered 2011-11-07: 4 mg via INTRAVENOUS

## 2011-11-07 MED ORDER — MIDAZOLAM HCL 5 MG/5ML IJ SOLN
INTRAMUSCULAR | Status: DC | PRN
  Administered 2011-11-07: 2 mg via INTRAVENOUS

## 2011-11-07 MED ORDER — DEXAMETHASONE SODIUM PHOSPHATE 10 MG/ML IJ SOLN
INTRAMUSCULAR | Status: DC | PRN
  Administered 2011-11-07: 10 mg via INTRAVENOUS

## 2011-11-07 MED ORDER — HYDROCODONE-ACETAMINOPHEN 5-325 MG PO TABS: ORAL_TABLET | ORAL | Status: AC

## 2011-11-07 MED ORDER — CHLORHEXIDINE GLUCONATE 4 % EX LIQD: 60.0000 mL | Freq: Once | CUTANEOUS | Status: DC

## 2011-11-07 MED ORDER — LIDOCAINE HCL 2 % IJ SOLN
INTRAMUSCULAR | Status: DC | PRN
  Administered 2011-11-07: 4 mL

## 2011-11-07 MED ORDER — PROPOFOL 10 MG/ML IV EMUL
INTRAVENOUS | Status: DC | PRN
  Administered 2011-11-07: 180 mg via INTRAVENOUS

## 2011-11-07 SURGICAL SUPPLY — 40 items
BANDAGE ADHESIVE 1X3 (GAUZE/BANDAGES/DRESSINGS) IMPLANT
BANDAGE ELASTIC 3 VELCRO ST LF (GAUZE/BANDAGES/DRESSINGS) ×2 IMPLANT
BLADE SURG 15 STRL LF DISP TIS (BLADE) ×1 IMPLANT
BLADE SURG 15 STRL SS (BLADE) ×2
BNDG CMPR 9X4 STRL LF SNTH (GAUZE/BANDAGES/DRESSINGS) ×1
BNDG ESMARK 4X9 LF (GAUZE/BANDAGES/DRESSINGS) ×1 IMPLANT
BRUSH SCRUB EZ PLAIN DRY (MISCELLANEOUS) ×2 IMPLANT
CLOTH BEACON ORANGE TIMEOUT ST (SAFETY) ×2 IMPLANT
CORDS BIPOLAR (ELECTRODE) IMPLANT
COVER MAYO STAND STRL (DRAPES) ×2 IMPLANT
COVER TABLE BACK 60X90 (DRAPES) ×2 IMPLANT
CUFF TOURNIQUET SINGLE 18IN (TOURNIQUET CUFF) ×1 IMPLANT
DECANTER SPIKE VIAL GLASS SM (MISCELLANEOUS) IMPLANT
DRAPE EXTREMITY T 121X128X90 (DRAPE) ×2 IMPLANT
DRAPE SURG 17X23 STRL (DRAPES) ×2 IMPLANT
GLOVE BIO SURGEON STRL SZ 6.5 (GLOVE) ×2 IMPLANT
GLOVE BIOGEL M STRL SZ7.5 (GLOVE) ×2 IMPLANT
GLOVE ORTHO TXT STRL SZ7.5 (GLOVE) ×2 IMPLANT
GOWN BRE IMP PREV XXLGXLNG (GOWN DISPOSABLE) ×2 IMPLANT
GOWN PREVENTION PLUS XLARGE (GOWN DISPOSABLE) ×2 IMPLANT
GOWN PREVENTION PLUS XXLARGE (GOWN DISPOSABLE) ×2 IMPLANT
NEEDLE 27GAX1X1/2 (NEEDLE) ×1 IMPLANT
PACK BASIN DAY SURGERY FS (CUSTOM PROCEDURE TRAY) ×2 IMPLANT
PAD CAST 3X4 CTTN HI CHSV (CAST SUPPLIES) ×1 IMPLANT
PADDING CAST ABS 4INX4YD NS (CAST SUPPLIES) ×1
PADDING CAST ABS COTTON 4X4 ST (CAST SUPPLIES) ×1 IMPLANT
PADDING CAST COTTON 3X4 STRL (CAST SUPPLIES) ×2
PADDING WEBRIL 3 STERILE (GAUZE/BANDAGES/DRESSINGS) IMPLANT
SPLINT PLASTER 3X15 (CAST SUPPLIES) IMPLANT
SPLINT PLASTER CAST XFAST 3X15 (CAST SUPPLIES) ×5 IMPLANT
SPLINT PLASTER XTRA FASTSET 3X (CAST SUPPLIES) ×5
SPONGE GAUZE 4X4 12PLY (GAUZE/BANDAGES/DRESSINGS) ×2 IMPLANT
STOCKINETTE 4X48 STRL (DRAPES) ×2 IMPLANT
STRIP CLOSURE SKIN 1/2X4 (GAUZE/BANDAGES/DRESSINGS) ×2 IMPLANT
SUT PROLENE 3 0 PS 2 (SUTURE) ×2 IMPLANT
SYR 3ML 23GX1 SAFETY (SYRINGE) IMPLANT
SYR CONTROL 10ML LL (SYRINGE) ×1 IMPLANT
TRAY DSU PREP LF (CUSTOM PROCEDURE TRAY) ×2 IMPLANT
UNDERPAD 30X30 INCONTINENT (UNDERPADS AND DIAPERS) ×2 IMPLANT
WATER STERILE IRR 1000ML POUR (IV SOLUTION) ×1 IMPLANT

## 2011-11-07 NOTE — Op Note (Signed)
NAME:  Tracey Santiago, Tracey Santiago                    ACCOUNT NO.:  MEDICAL RECORD NO.:  [id number]  LOCATION:                                 FACILITY:  PHYSICIAN:  Katy Fitch. Jaclynn Laumann, M.D. DATE OF BIRTH:  07-06-1956  DATE OF PROCEDURE:  11/07/2011 DATE OF DISCHARGE:                              OPERATIVE REPORT   PREOPERATIVE DIAGNOSIS:  Chronic entrapment neuropathy, left median nerve at carpal tunnel.  POSTOP DIAGNOSIS:  Chronic entrapment neuropathy, left median nerve at carpal tunnel.  OPERATIONS:  Release of left transverse carpal ligament.  SURGEON:  Katy Fitch. Enda Santo, M.D..  ASSISTANT:  Jonni Sanger, P.A..  ANESTHESIA:  General by LMA.  SUPERVISING ANESTHESIOLOGIST:  Dr. 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  Clinical examination revealed signs of probable carpal tunnel syndrome.  She was evaluated, electrodiagnostic studies which revealed moderate to severe bilateral carpal tunnel syndrome.  She has been unable to sleep at night due to her numbness and was using hydrocodone due to her degree of pain.  After informed consent, she is brought to the operating at this time.  Preoperatively, she was interviewed by Dr. Chaney Malling of anesthesia.  He recommended general anesthesia by LMA technique.  This was accepted by Ms. Wojdyla and her husband.  After questions invited and answered in detail, she is brought to the operating room at this time.  PROCEDURE:  [name] was brought to room 8 of the [facility] and placed in supine position on the operating table.  Following induction of general anesthesia by LMA technique, the left arm was prepped with Betadine soap and solution, sterilely draped.  A pneumatic tourniquet was applied proximal to brachium.  Following exsanguination of the left arm with Esmarch bandage, arterial tourniquet was inflated to 220 mmHg.  Procedure  commenced a short incision in line of the ring finger to the palm.  Subcutaneous tissues were carefully divided in the palmar fascia.  The split longitudinally to the common sensory branch of the median nerve.  These were followed back to the transverse carpal ligament which was gently isolated from the median nerve with a Insurance risk surveyor.  Once the neurovascular structures were carefully identified and protected, the transcarpal ligament was released along its ulnar border, extending into the distal forearm.  This widely opened carpal canal.  No mass or other predicaments were noted.  Bleeding points along the margin of the released ligament were electrocauterized with bipolar current followed by repair of the skin with intradermal 3-0 Prolene suture.  Compressive dressing applied with a volar plaster splint maintaining the wrist in 10 degrees of dorsiflexion.  The wound margins were anesthetized with 2% lidocaine for postoperative analgesia.     Katy Fitch Layaan Mott, M.D.     RVS/MEDQ  D:  11/07/2011  T:  11/07/2011  Job:  [id number]

## 2011-11-07 NOTE — Anesthesia Preprocedure Evaluation (Signed)
Anesthesia Evaluation  Patient identified by MRN, date of birth, ID band Patient awake    Reviewed: Allergy & Precautions, H&P , NPO status , Patient's Chart, lab work & pertinent test results  History of Anesthesia Complications (+) PONV  Airway Mallampati: II  Neck ROM: full    Dental   Pulmonary former smoker         Cardiovascular     Neuro/Psych    GI/Hepatic   Endo/Other    Renal/GU      Musculoskeletal  (+) Arthritis -,   Abdominal   Peds  Hematology   Anesthesia Other Findings   Reproductive/Obstetrics                           Anesthesia Physical Anesthesia Plan  ASA: II  Anesthesia Plan: General   Post-op Pain Management:    Induction: Intravenous  Airway Management Planned: LMA  Additional Equipment:   Intra-op Plan:   Post-operative Plan:   Informed Consent: I have reviewed the patients History and Physical, chart, labs and discussed the procedure including the risks, benefits and alternatives for the proposed anesthesia with the patient or authorized representative who has indicated his/her understanding and acceptance.     Plan Discussed with: CRNA and Surgeon  Anesthesia Plan Comments:         Anesthesia Quick Evaluation  

## 2011-11-07 NOTE — Transfer of Care (Signed)
Immediate Anesthesia Transfer of Care Note  Patient: Tracey Santiago  Procedure(s) Performed: Procedure(s) (LRB): CARPAL TUNNEL RELEASE (Left)  Patient Location: PACU  Anesthesia Type: General  Level of Consciousness: sedated  Airway & Oxygen Therapy: Patient Spontanous Breathing and Patient connected to face mask oxygen  Post-op Assessment: Report given to PACU RN and Post -op Vital signs reviewed and stable  Post vital signs: Reviewed and stable  Complications: No apparent anesthesia complications

## 2011-11-07 NOTE — Anesthesia Postprocedure Evaluation (Signed)
Anesthesia Post Note  Patient: Tracey Santiago  Procedure(s) Performed: Procedure(s) (LRB): CARPAL TUNNEL RELEASE (Left)  Anesthesia type: General  Patient location: PACU  Post pain: Pain level controlled and Adequate analgesia  Post assessment: Post-op Vital signs reviewed, Patient's Cardiovascular Status Stable, Respiratory Function Stable, Patent Airway and Pain level controlled  Last Vitals:  Filed Vitals:   11/07/11 1039  BP: 126/75  Pulse: 78  Temp:   Resp:     Post vital signs: Reviewed and stable  Level of consciousness: awake, alert  and oriented  Complications: No apparent anesthesia complications

## 2011-11-07 NOTE — Op Note (Signed)
OP NOTE DICTATED: 11/07/11 [id number]

## 2011-11-07 NOTE — H&P (Signed)
Tracey Santiago is an 56 y.o. female.   Chief Complaint: c/o chronic and progressive numbness and tingling left hand   HPI: Pt is a 56 y/o right handed female who presented for evaluation and treatment bilat. numbness and tingling. She tried conservative treatment with splints and p.o. meds without success. NCV evaluation revealed bilateral CTS. She wishes to proceed with left CTR at this time.  Past Medical History  Diagnosis Date  . Post-operative nausea and vomiting   . Arthritis     spine, right hand  . Carpal tunnel syndrome, left 10/2011  . Cancer 2005    cervical cancer     Past Surgical History  Procedure Date  . Cesarean section   . Tonsillectomy   . Abdominal hysterectomy 05/28/2004    pelvic lymphadenectomy, ovarian transposition, suprapubic cath. placement  . Posterior laminectomy / decompression lumbar spine 07/21/2011    arthrodesis L4-5    History reviewed. No pertinent family history. Social History:  reports that she quit smoking about 15 years ago. She has never used smokeless tobacco. She reports that she drinks alcohol. She reports that she does not use illicit drugs.  Allergies: No Known Allergies  Medications Prior to Admission  Medication Dose Route Frequency Provider Last Rate Last Dose  . DISCONTD: diphenhydrAMINE (BENADRYL) 12.5 MG/5ML elixir 12.5 mg  12.5 mg Oral Q6H PRN Stefani Dama, MD      . DISCONTD: diphenhydrAMINE (BENADRYL) injection 12.5 mg  12.5 mg Intravenous Q6H PRN Stefani Dama, MD      . DISCONTD: HYDROmorphone (DILAUDID) injection 0.25-0.5 mg  0.25-0.5 mg Intravenous Q5 min PRN Kerby Nora, MD      . DISCONTD: meperidine (DEMEROL) injection 6.25-12.5 mg  6.25-12.5 mg Intravenous PRN Kerby Nora, MD      . DISCONTD: morphine 4 MG/ML injection 3.04 mg  0.05 mg/kg Intravenous Q10 min PRN Kerby Nora, MD      . DISCONTD: naloxone [facility]) injection 0.4 mg  0.4 mg Intravenous PRN Stefani Dama, MD      . DISCONTD: ondansetron  [facility]) injection 4 mg  4 mg Intravenous Once PRN Kerby Nora, MD      . DISCONTD: sodium chloride 0.9 % injection 9 mL  9 mL Intravenous PRN Stefani Dama, MD       Medications Prior to Admission  Medication Sig Dispense Refill  . ALPRAZolam (XANAX) 0.5 MG tablet Take 0.25-0.5 mg by mouth every 12 (twelve) hours as needed. for anxiety      . Ascorbic Acid (VITAMIN C) 1000 MG tablet Take 1,000 mg by mouth daily.        . calcium carbonate (TUMS - DOSED IN MG ELEMENTAL CALCIUM) 500 MG chewable tablet Chew 2 tablets by mouth daily.        Marland Kitchen HYDROcodone-acetaminophen (NORCO) 5-325 MG per tablet Take 1 tablet by mouth every 6 (six) hours as needed.      Marland Kitchen MAGNESIUM PO Take 2 tablets by mouth daily.       . OMEGA 3 1200 MG CAPS Take 2 capsules by mouth daily.        . polyethylene glycol powder (GLYCOLAX/MIRALAX) powder Take 17 g by mouth every other day.        . vitamin D, CHOLECALCIFEROL, 400 UNITS tablet Take 400 Units by mouth daily.        Marland Kitchen zolpidem (AMBIEN) 10 MG tablet Take 10 mg by mouth at bedtime as needed. for sleep  No results found for this or any previous visit (from the past 48 hour(s)).  No results found.   Pertinent items are noted in HPI.  Height 5' 7.5" (1.715 m), weight 61.236 kg (135 lb).  General appearance: alert Head: Normocephalic, without obvious abnormality Neck: supple, symmetrical, trachea midline Resp: clear to auscultation bilaterally Cardio: regular rate and rhythm, S1, S2 normal, no murmur, click, rub or gallop GI: normal findings: bowel sounds normal Extremities: Examination of her hands revealed positive phalens and Tinels. Normal sweat pattern and dermatoglyphics. NCV revealed moderate CTS on left.  Pulses: 2+ and symmetric Skin: normal Neurologic: Grossly normal    Assessment/Plan Impression: Left CTS  PLAN: Pt to OR for Left CTR. The procedure, risks and benefits were discussed with the patient at length and she was in agreement  with the plan.  DASNOIT,Younique Casad J 11/07/2011, 7:46 AM   H&P documentation: 11/07/2011  -History and Physical Reviewed  -Patient has been re-examined  -No change in the plan of care  Wyn Forster, MD

## 2011-11-07 NOTE — Brief Op Note (Signed)
11/07/2011  10:24 AM  PATIENT:  Tracey Santiago  56 y.o. female  PRE-OPERATIVE DIAGNOSIS:  Severely painful left carpal tunnel syndrome  POST-OPERATIVE DIAGNOSIS:  Severely painful left carpal tunnel syndrome  PROCEDURE:  CARPAL TUNNEL RELEASE (Left)  SURGEON:  Wyn Forster., MD   PHYSICIAN ASSISTANT:   ASSISTANTS: Mallory Shirk.A-C    ANESTHESIA:   general  EBL:   NONE  BLOOD ADMINISTERED:none  DRAINS: none   LOCAL MEDICATIONS USED:  XYLOCAINE   SPECIMEN:  No Specimen  DISPOSITION OF SPECIMEN:  N/A  COUNTS:  YES  TOURNIQUET TIME: SEE NURSING NOTES  DICTATION: .Other Dictation: Dictation Number [id number]  PLAN OF CARE: Discharge to home after PACU  PATIENT DISPOSITION:  PACU - hemodynamically stable.

## 2011-11-07 NOTE — Anesthesia Procedure Notes (Signed)
Procedure Name: LMA Insertion Date/Time: 11/07/2011 10:09 AM Performed by: Signa Kell Pre-anesthesia Checklist: Patient identified, Emergency Drugs available, Suction available and Patient being monitored Patient Re-evaluated:Patient Re-evaluated prior to inductionOxygen Delivery Method: Circle System Utilized Preoxygenation: Pre-oxygenation with 100% oxygen Intubation Type: IV induction Ventilation: Mask ventilation without difficulty LMA: LMA inserted LMA Size: 4.0 Number of attempts: 1 Airway Equipment and Method: bite block Placement Confirmation: positive ETCO2 Tube secured with: Tape Dental Injury: Teeth and Oropharynx as per pre-operative assessment

## 2011-11-10 ENCOUNTER — Encounter (HOSPITAL_BASED_OUTPATIENT_CLINIC_OR_DEPARTMENT_OTHER): Payer: Self-pay | Admitting: Orthopedic Surgery

## 2012-01-08 ENCOUNTER — Encounter: Payer: Self-pay | Admitting: *Deleted

## 2012-01-12 ENCOUNTER — Encounter: Payer: BC Managed Care – PPO | Admitting: Family Medicine

## 2012-01-22 ENCOUNTER — Other Ambulatory Visit: Payer: Self-pay | Admitting: Family Medicine

## 2012-01-22 ENCOUNTER — Ambulatory Visit (INDEPENDENT_AMBULATORY_CARE_PROVIDER_SITE_OTHER): Payer: BC Managed Care – PPO | Admitting: Family Medicine

## 2012-01-22 ENCOUNTER — Encounter: Payer: Self-pay | Admitting: Family Medicine

## 2012-01-22 ENCOUNTER — Other Ambulatory Visit (HOSPITAL_COMMUNITY)
Admission: RE | Admit: 2012-01-22 | Discharge: 2012-01-22 | Disposition: A | Discharge status: Home / Self Care | Payer: BC Managed Care – PPO | Source: Ambulatory Visit | Attending: Family Medicine | Admitting: Family Medicine

## 2012-01-22 VITALS — BP 128/82 | HR 80 | Ht 69.0 in | Wt 141.0 lb

## 2012-01-22 DIAGNOSIS — Z13220 Encounter for screening for lipoid disorders: Secondary | ICD-10-CM

## 2012-01-22 DIAGNOSIS — R5383 Other fatigue: Secondary | ICD-10-CM

## 2012-01-22 DIAGNOSIS — Z01419 Encounter for gynecological examination (general) (routine) without abnormal findings: Secondary | ICD-10-CM | POA: Insufficient documentation

## 2012-01-22 DIAGNOSIS — Z0000 Encounter for general adult medical examination without abnormal findings: Secondary | ICD-10-CM

## 2012-01-22 DIAGNOSIS — G5600 Carpal tunnel syndrome, unspecified upper limb: Secondary | ICD-10-CM

## 2012-01-22 DIAGNOSIS — R5381 Other malaise: Secondary | ICD-10-CM

## 2012-01-22 DIAGNOSIS — Z8541 Personal history of malignant neoplasm of cervix uteri: Secondary | ICD-10-CM

## 2012-01-22 LAB — POCT URINALYSIS DIPSTICK
Bilirubin, UA: NEGATIVE
Blood, UA: NEGATIVE
Glucose, UA: NEGATIVE
Ketones, UA: NEGATIVE
Leukocytes, UA: NEGATIVE
Nitrite, UA: NEGATIVE
Protein, UA: NEGATIVE
Spec Grav, UA: <=1.005
Urobilinogen, UA: NEGATIVE
pH, UA: 5

## 2012-01-22 LAB — CBC WITH DIFFERENTIAL/PLATELET
Basophils Absolute: 0 10*3/uL (ref 0.0–0.1)
Basophils Relative: 1 % (ref 0–1)
Eosinophils Absolute: 0.1 10*3/uL (ref 0.0–0.7)
Eosinophils Relative: 1 % (ref 0–5)
HCT: 42.2 % (ref 36.0–46.0)
Hemoglobin: 14.2 g/dL (ref 12.0–15.0)
Lymphocytes Relative: 31 % (ref 12–46)
Lymphs Abs: 1.9 10*3/uL (ref 0.7–4.0)
MCH: 32.3 pg (ref 26.0–34.0)
MCHC: 33.6 g/dL (ref 30.0–36.0)
MCV: 96.1 fL (ref 78.0–100.0)
Monocytes Absolute: 0.5 10*3/uL (ref 0.1–1.0)
Monocytes Relative: 8 % (ref 3–12)
Neutro Abs: 3.8 10*3/uL (ref 1.7–7.7)
Neutrophils Relative %: 60 % (ref 43–77)
Platelets: 239 10*3/uL (ref 150–400)
RBC: 4.39 MIL/uL (ref 3.87–5.11)
RDW: 12.8 % (ref 11.5–15.5)
WBC: 6.3 10*3/uL (ref 4.0–10.5)

## 2012-01-22 LAB — LIPID PANEL
Cholesterol: 250 mg/dL — ABNORMAL HIGH (ref 0–200)
HDL: 108 mg/dL (ref >39)
LDL Cholesterol: 131 mg/dL — ABNORMAL HIGH (ref 0–99)
Total CHOL/HDL Ratio: 2.3 Ratio
Triglycerides: 55 mg/dL (ref <150)
VLDL: 11 mg/dL (ref 0–40)

## 2012-01-22 LAB — COMPREHENSIVE METABOLIC PANEL
ALT: 18 U/L (ref 0–35)
AST: 20 U/L (ref 0–37)
Albumin: 5 g/dL (ref 3.5–5.2)
Alkaline Phosphatase: 67 U/L (ref 39–117)
BUN: 19 mg/dL (ref 6–23)
CO2: 26 mEq/L (ref 19–32)
Calcium: 9.7 mg/dL (ref 8.4–10.5)
Chloride: 102 mEq/L (ref 96–112)
Creat: 0.69 mg/dL (ref 0.50–1.10)
Glucose, Bld: 115 mg/dL — ABNORMAL HIGH (ref 70–99)
Potassium: 3.9 mEq/L (ref 3.5–5.3)
Sodium: 140 mEq/L (ref 135–145)
Total Bilirubin: 0.8 mg/dL (ref 0.3–1.2)
Total Protein: 7.1 g/dL (ref 6.0–8.3)

## 2012-01-22 LAB — TSH: TSH: 0.939 u[IU]/mL (ref 0.350–4.500)

## 2012-01-22 NOTE — Patient Instructions (Addendum)
HEALTH MAINTENANCE RECOMMENDATIONS:  It is recommended that you get at least 30 minutes of aerobic exercise at least 5 days/week (for weight loss, you may need as much as 60-90 minutes). This can be any activity that gets your heart rate up. This can be divided in 10-15 minute intervals if needed, but try and build up your endurance at least once a week.  Weight bearing exercise is also recommended twice weekly.  Eat a healthy diet with lots of vegetables, fruits and fiber.  "Colorful" foods have a lot of vitamins (ie green vegetables, tomatoes, red peppers, etc).  Limit sweet tea, regular sodas and alcoholic beverages, all of which has a lot of calories and sugar.  Up to 1 alcoholic drink daily may be beneficial for women (unless trying to lose weight, watch sugars).  Drink a lot of water.  Calcium recommendations are 1200-1500 mg daily (1500 mg for postmenopausal women or women without ovaries), and vitamin D 1000 IU daily.  This should be obtained from diet and/or supplements (vitamins), and calcium should not be taken all at once, but in divided doses.  Monthly self breast exams and yearly mammograms for women over the age of 59 is recommended.  Sunscreen of at least SPF 30 should be used on all sun-exposed parts of the skin when outside between the hours of 10 am and 4 pm (not just when at beach or pool, but even with exercise, golf, tennis, and yard work!)  Use a sunscreen that says "broad spectrum" so it covers both UVA and UVB rays, and make sure to reapply every 1-2 hours.  Remember to change the batteries in your smoke detectors when changing your clock times in the spring and fall.  Use your seat belt every time you are in a car, and please drive safely and not be distracted with cell phones and texting while driving.  Check with Dr. Kinnie Scales re: true need for repeat colonoscopy at 5 years. Check with insurance, if there is true need, to see if it would cost $1800 vs lower cost elsewhere  (?[facility])

## 2012-01-22 NOTE — Progress Notes (Signed)
Tracey Santiago is a 56 y.o. female who presents for a complete physical.  She has the following concerns:  New patient fasting CPE with PAP. Wants opinion on hand surgery that she needs. Had colonoscopy in 2007, thinks she was to follow up in 10 years, but deducible has been met and she believes that she received a notice recently to schedule one, is it possible to go ahead and scheudule one for this year? Also plans to have foot surgery and carpal tunnel release since she has already met deductible.  Had colonoscopy with Dr. Kinnie Scales in 11/07.  Denies h/o polyps or family history of problems.  Denies any symptoms.  Got recall notification--old records stated "repeat 5-10 years".   Husband recently had colonoscopy and it cost $1800, which is why she is considering getting now.  Pain due to hammertoe L 2nd toe, causes pain with wearing shoes.  She sees Dr. Victorino Dike, who recommended surgery.  She is also planning to have her R CT release surgery soon, as she has met her deductible for the year.  Still having some numbness in L hand from recent surgery.  Health Maintenance: Immunization History  Administered Date(s) Administered  . Tdap 09/15/2008   Last Pap smear: 01/2011 Last mammogram: 12/2010 Last colonoscopy: 11/07 Last DEXA: never Dentist: twice yearly Ophtho: yearly Exercise: walks 5-6 miles/day in total (much through work, then walks 2 miles/day)  Past Medical History  Diagnosis Date  . Post-operative nausea and vomiting   . Arthritis     spine, right hand  . Carpal tunnel syndrome, left 10/2011  . Cervical cancer 2005  . DDD (degenerative disc disease), cervical     C4-5, C5-6, some oseophytosis, foraminal narrowing C3-4, C4-5    Past Surgical History  Procedure Date  . Cesarean section   . Tonsillectomy   . Abdominal hysterectomy 05/28/2004    pelvic lymphadenectomy, ovarian transposition, suprapubic cath. placement  . Posterior laminectomy / decompression lumbar spine 07/21/2011     arthrodesis L4-5 (Dr. Danielle Dess)  . Carpal tunnel release 11/07/2011    Procedure: CARPAL TUNNEL RELEASE;  Surgeon: Wyn Forster., MD;  Location: [facility];  Service: Orthopedics;  Laterality: Left;  . Breast surgery 1987    breast augmentation, implants replaced 11/2008    History   Social History  . Marital Status: Married    Spouse Name: N/A    Number of Children: 3  . Years of Education: N/A   Occupational History  . Works at [employer]    Social History Main Topics  . Smoking status: Former Smoker    Quit date: 07/13/1996  . Smokeless tobacco: Never Used  . Alcohol Use: 0.0 oz/week     daily - 2-3 drinks  . Drug Use: No  . Sexually Active: Yes -- Female partner(s)    Birth Control/ Protection: Surgical   Other Topics Concern  . Not on file   Social History Narrative   Lives with husband.  Daughter in [place],  2 sons in [place]. 1 dog    Family History  Problem Relation Age of Onset  . Osteoporosis Mother   . Arthritis Mother   . Diabetes Mother   . Hearing loss Mother   . Hypertension Father   . Cancer Father   . Heart disease Father 55    CABG  . Peripheral vascular disease Father     s/p bypasses    Current outpatient prescriptions:ALPRAZolam (XANAX) 0.5 MG tablet, Take  0.25-0.5 mg by mouth every 12 (twelve) hours as needed. for anxiety, Disp: , Rfl: ;  Ascorbic Acid (VITAMIN C) 1000 MG tablet, Take 1,000 mg by mouth daily.  , Disp: , Rfl: ;  b complex vitamins tablet, Take 1 tablet by mouth daily., Disp: , Rfl: ;  calcium carbonate (TUMS - DOSED IN MG ELEMENTAL CALCIUM) 500 MG chewable tablet, Chew 2 tablets by mouth daily.  , Disp: , Rfl:  Glucosamine Sulfate 500 MG CAPS, Take 1 capsule by mouth daily., Disp: , Rfl: ;  MAGNESIUM PO, Take 2 tablets by mouth daily. , Disp: , Rfl: ;  OMEGA 3 1200 MG CAPS, Take 2 capsules by mouth daily.  , Disp: , Rfl: ;  polyethylene glycol powder (GLYCOLAX/MIRALAX) powder, Take 17 g by mouth  every other day.  , Disp: , Rfl: ;  vitamin D, CHOLECALCIFEROL, 400 UNITS tablet, Take 400 Units by mouth daily.  , Disp: , Rfl:   No Known Allergies  ROS:  The patient denies anorexia, fever, weight changes, headaches,  vision changes, decreased hearing, ear pain, sore throat, breast concerns, chest pain, palpitations, dizziness, syncope, dyspnea on exertion, cough, swelling, nausea, vomiting, diarrhea, constipation, abdominal pain, melena, hematochezia, indigestion/heartburn, hematuria, incontinence, dysuria, vaginal bleeding, discharge, odor or itch, genital lesions,numbness, tingling, weakness, tremor, suspicious skin lesions, depression, anxiety, abnormal bleeding/bruising, or enlarged lymph nodes.  Numbness/tingling in both hands.  Feet tingling resolved s/p surgery. Arthritis pains in hand, neck.  Back pain is much improved s/p surgery.    +h/o insomnia, previously took Ambien.  She was told by other doctors she needed to stop taking it.  Sleep overall isn't as bad.  Uses PM meds as needed.  Has some xanax leftover from back surgery that also works.  PHYSICAL EXAM: BP 128/82  Pulse 80  Ht 5\' 9"  (1.753 m)  Wt 141 lb (63.957 kg)  BMI 20.82 kg/m2  General Appearance:    Alert, cooperative, no distress, appears stated age  Head:    Normocephalic, without obvious abnormality, atraumatic  Eyes:    PERRL, conjunctiva/corneas clear, EOM's intact, fundi    benign  Ears:    Normal TM's and external ear canals  Nose:   Nares normal, mucosa normal, no drainage or sinus   tenderness  Throat:   Lips, mucosa, and tongue normal; teeth and gums normal  Neck:   Supple, no lymphadenopathy;  thyroid:  no   enlargement/tenderness/nodules; no carotid   bruit or JVD  Back:    Spine nontender, no curvature, ROM normal, no CVA     tenderness  Lungs:     Clear to auscultation bilaterally without wheezes, rales or     ronchi; respirations unlabored  Chest Wall:    No tenderness or deformity   Heart:     Regular rate and rhythm, S1 and S2 normal, no murmur, rub   or gallop  Breast Exam:    No tenderness, masses, or nipple discharge or inversion.      No axillary lymphadenopathy. + implants  Abdomen:     Soft, non-tender, nondistended, normoactive bowel sounds,    no masses, no hepatosplenomegaly  Genitalia:    Normal external genitalia without lesions.  BUS and vagina normal; No abnormal vaginal discharge.  Uterus and ovaries are surgically absent, nontender, no mass.  Vaginal cuff appears normal.  Pap of vaginal cuff obtained.   Rectal:    Normal tone, no masses or tenderness; no stool in vault  Extremities:   No clubbing,  cyanosis or edema.  Hammertoe L 2nd toe with overlying callous  Pulses:   2+ and symmetric all extremities  Skin:   Skin color, texture, turgor normal, no rashes or lesions  Lymph nodes:   Cervical, supraclavicular, and axillary nodes normal  Neurologic:   CNII-XII intact, normal strength, sensation and gait; reflexes 2+ and symmetric throughout          Psych:   Normal mood, affect, hygiene and grooming.    ASSESSMENT/PLAN:  1. Routine general medical examination at a health care facility  POCT Urinalysis Dipstick, Visual acuity screening, Cytology - PAP, Comprehensive metabolic panel, CBC with Differential, Vitamin D 25 hydroxy, TSH  2. Carpal tunnel syndrome    3. History of cervical cancer  Cytology - PAP  4. Screening for lipoid disorders  Lipid panel  5. Other malaise and fatigue  Comprehensive metabolic panel, CBC with Differential, Vitamin D 25 hydroxy, TSH   Discussed monthly self breast exams and yearly mammograms after the age of 4; at least 30 minutes of aerobic activity at least 5 days/week; proper sunscreen use reviewed; healthy diet, including goals of calcium and vitamin D intake and alcohol recommendations (less than or equal to 1 drink/day) reviewed; regular seatbelt use; changing batteries in smoke detectors.  Immunization recommendations discussed--flu  shots recommended.  Colonoscopy recommendations reviewed.  ? If she truly needs repeat now vs 5 years.  Check with Dr. Kinnie Scales re: need, and check with insurance.  If there is true need to be repeated now, see if it would cost $1800 vs lower cost elsewhere (?[facility])  Hemoccult kit given--if not getting colonoscopy, to send in

## 2012-01-23 ENCOUNTER — Encounter: Payer: Self-pay | Admitting: Family Medicine

## 2012-01-23 LAB — HEMOGLOBIN A1C
Hgb A1c MFr Bld: 5.5 % (ref <5.7)
Mean Plasma Glucose: 111 mg/dL (ref <117)

## 2012-01-23 LAB — VITAMIN D 25 HYDROXY (VIT D DEFICIENCY, FRACTURES): Vit D, 25-Hydroxy: 42 ng/mL (ref 30–89)

## 2012-01-26 ENCOUNTER — Encounter: Payer: Self-pay | Admitting: Family Medicine

## 2012-01-26 ENCOUNTER — Other Ambulatory Visit: Payer: Self-pay | Admitting: *Deleted

## 2012-01-26 DIAGNOSIS — R7301 Impaired fasting glucose: Secondary | ICD-10-CM

## 2012-01-26 DIAGNOSIS — E7800 Pure hypercholesterolemia, unspecified: Secondary | ICD-10-CM

## 2012-02-18 ENCOUNTER — Other Ambulatory Visit: Payer: Self-pay | Admitting: Family Medicine

## 2012-02-18 MED ORDER — ALPRAZOLAM 0.5 MG PO TABS: 0.2500 mg | ORAL_TABLET | Freq: Two times a day (BID) | ORAL | Status: DC | PRN

## 2012-02-18 NOTE — Telephone Encounter (Signed)
Pt was offered and appt and pt stated it was talked about at cpe. Pt requested a rx for xanax to helo her sleep. Pt uses [facility].

## 2012-02-18 NOTE — Telephone Encounter (Signed)
Okay to phone in to [facility] please

## 2012-03-05 ENCOUNTER — Encounter: Payer: Self-pay | Admitting: Medical

## 2012-03-05 ENCOUNTER — Ambulatory Visit (INDEPENDENT_AMBULATORY_CARE_PROVIDER_SITE_OTHER): Payer: BC Managed Care – PPO | Admitting: Medical

## 2012-03-05 VITALS — BP 110/80 | HR 76 | Temp 98.0°F | Resp 16 | Wt 142.0 lb

## 2012-03-05 DIAGNOSIS — J3489 Other specified disorders of nose and nasal sinuses: Secondary | ICD-10-CM

## 2012-03-05 DIAGNOSIS — R519 Headache, unspecified: Secondary | ICD-10-CM

## 2012-03-05 DIAGNOSIS — R51 Headache: Secondary | ICD-10-CM

## 2012-03-05 DIAGNOSIS — M79609 Pain in unspecified limb: Secondary | ICD-10-CM

## 2012-03-05 DIAGNOSIS — M79606 Pain in leg, unspecified: Secondary | ICD-10-CM

## 2012-03-05 MED ORDER — AMOXICILLIN 875 MG PO TABS: 875.0000 mg | ORAL_TABLET | Freq: Two times a day (BID) | ORAL | Status: AC

## 2012-03-05 NOTE — Progress Notes (Signed)
Subjective:  Tracey Santiago is a 56 y.o. female who presents for 3 day hx/o knot on left side of her nose is sore.  Yesterday this seemed to spread throughout her face.  Has lots of pain in her face when leaning forwards.  Has some runny nose.  No headache, no sore throat, no tooth pain, no NVD, no ear pain, no cough, congestion, fever, chills, no seasonal allergies, no prior sinus infection.  No trauma to the head or face.  Wondered about sinus infection or bug bite.  No redness or warmth.  She has second c/o pain in her groin.  She thinks may be pulled muscle.  She notes hx/o L 4-5 fusion in November, went to physical therapy afterward.  She had pain in groin then, the therapist worked with this, but this never seemed to resolve.  She has used ice, rest, Advil, but nothing seems to relieve the symptoms.  This is a daily pain.  No other aggravating or relieving factors.    Of note, she sees a Hydrographic surveyor and orthopedics.   Neurosurgery did her lumbar fusion 2012, and she recently had left carpal tunnel release, the right will be done sooner or later, and she has foot surgeon planned upcoming soon.  No other c/o.  Past Medical History  Diagnosis Date  . Post-operative nausea and vomiting   . Arthritis     spine, right hand  . Carpal tunnel syndrome, left 10/2011  . Cervical cancer 2005  . DDD (degenerative disc disease), cervical     C4-5, C5-6, some oseophytosis, foraminal narrowing C3-4, C4-5     Objective:   Filed Vitals:   03/05/12 0835  BP: 110/80  Pulse: 76  Temp: 98 F (36.7 C)  Resp: 16    General appearance: Alert, WD/WN, no distress, white female                             Skin: warm, no rash                           Head: +maxillary sinus tenderness bilat, left cheek swollen adjacent to nose, tender along same area but no warmth or erythema                            Eyes: conjunctiva normal, corneas clear, PERRLA                            Ears: pearly TMs, external  ear canals normal                          Nose: septum midline, turbinates swollen, with erythema and clear discharge             Mouth/throat: MMM, tongue normal, mild pharyngeal erythema                           Neck: supple, no adenopathy, no thyromegaly, nontender                          Heart: RRR, normal S1, S2, no murmurs  Lungs: CTA bilaterally, no wheezes, rales, or rhonchi MSK: tender along medial right thigh, upper thigh, pain when standing or rotating the right hip, pain with right leg resisted adduction and abduction, but no swelling or obvious deformity, rest of bilat LE unremarkable Neuro: normal LE strength and sensation and DTRs Pulses: normal LE pulses Ext: no swelling   Assessment and Plan:   Encounter Diagnoses  Name Primary?  . Facial pain Yes  . Sinus pressure   . Leg pain    Facial pain and sinus pressure - early cellulitis vs sinus infection, course of Amoxicillin, rest, hydrate well, warm facial compresses, and decongestant.  Call or return if worse or not improving.  Leg pain - discussed her symptoms and possible differential.  Advised she [place] trial of either water aerobics, lap swimming or bicycling for the pain and ongoing hip issue.   She can also discuss with her orthopedic.   If not improving in 3-4 wk, consider right hip xray.

## 2012-04-01 ENCOUNTER — Other Ambulatory Visit: Payer: Self-pay | Admitting: Orthopedic Surgery

## 2012-04-01 ENCOUNTER — Encounter (HOSPITAL_BASED_OUTPATIENT_CLINIC_OR_DEPARTMENT_OTHER): Payer: Self-pay | Admitting: *Deleted

## 2012-04-07 NOTE — H&P (Signed)
Tracey Santiago is an 56 y.o. female.   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 She has a history of bilateral hand numbness dating back more than 2 years. She has had night pain that has been excruciating and burning in the left hand. 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 She is s/p L4/5 decompression and fusion by Dr. Danielle Santiago in November 2012. She has had a prior radical hysterectomy for cervical cancer with negative nodes. She had a C-section in 1985. She has been undergoing therapy at [facility] and reported her hand symptoms. She was advised to seek an upper extremity orthopaedic consult.She underwent left hand CTR 11/07/11. She has had a slow but steady improvement in her left hand symptoms and now wishes to proceed with right CTR.     Past Medical History  Diagnosis Date  . Post-operative nausea and vomiting   . Arthritis     spine, right hand  . Carpal tunnel syndrome, left 10/2011  . DDD (degenerative disc disease), cervical     C4-5, C5-6, some oseophytosis, foraminal narrowing C3-4, C4-5  . Cervical cancer 2005    Past Surgical History  Procedure Date  . Cesarean section   . Tonsillectomy   . Abdominal hysterectomy 05/28/2004    pelvic lymphadenectomy, ovarian transposition, suprapubic cath. placement  . Posterior laminectomy / decompression lumbar spine 07/21/2011    arthrodesis L4-5 (Dr. Danielle Santiago)  . Carpal tunnel release 11/07/2011    Procedure: CARPAL TUNNEL RELEASE;  Surgeon: Tracey Santiago., MD;  Location: [facility];  Service: Orthopedics;  Laterality: Left;  . Breast surgery 1987    breast augmentation, implants replaced 11/2008    Family History  Problem  Relation Age of Onset  . Osteoporosis Mother   . Arthritis Mother   . Diabetes Mother   . Hearing loss Mother   . Hypertension Father   . Cancer Father   . Heart disease Father 68    CABG  . Peripheral vascular disease Father     s/p bypasses   Social History:  reports that she quit smoking about 15 years ago. She has never used smokeless tobacco. She reports that she drinks alcohol. She reports that she does not use illicit drugs.  Allergies: No Known Allergies  No prescriptions prior to admission    No results found for this or any previous visit (from the past 48 hour(s)).  No results found.   Pertinent items are noted in HPI.  There were no vitals taken for this visit.  General appearance: alert Head: Normocephalic, without obvious abnormality Neck: supple, symmetrical, trachea midline Resp: clear to auscultation bilaterally Cardio: regular rate and rhythm GI: normal findings: bowel sounds normal Extremities: Inspection of her right hand reveals the stigmata of osteoarthritis including Heberden's and Bouchard's nodes. She has diminished sensibility in the median distribution of the right hand, positive wrist flexion test on the right. She has a positive Tinel's sign over the median nerve. She does not have proximal numbness at C5/6 or 7 distribution. Her pulses and capillary refill are normal.   Pulses: 2+ and symmetric Skin: normal Neurologic: Grossly normal   Assessment/Plan  Impression: Right CTS  Plan:   To the OR for right CTR. The  procedure, risks and post-op course were discussed with the patient at length and she was in agreement with the plan.  Tracey Santiago 04/07/2012, 4:11 PM    H&P documentation: 04/08/2012  -History and Physical Reviewed  -Patient has been re-examined  -No change in the plan of care  Tracey Forster, MD

## 2012-04-08 ENCOUNTER — Encounter (HOSPITAL_BASED_OUTPATIENT_CLINIC_OR_DEPARTMENT_OTHER)
Admission: RE | Disposition: A | Discharge status: Home / Self Care | Payer: Self-pay | Source: Ambulatory Visit | Attending: Orthopedic Surgery

## 2012-04-08 ENCOUNTER — Ambulatory Visit (HOSPITAL_BASED_OUTPATIENT_CLINIC_OR_DEPARTMENT_OTHER): Payer: BC Managed Care – PPO | Admitting: Certified Registered Nurse Anesthetist

## 2012-04-08 ENCOUNTER — Ambulatory Visit (HOSPITAL_BASED_OUTPATIENT_CLINIC_OR_DEPARTMENT_OTHER)
Admission: RE | Admit: 2012-04-08 | Discharge: 2012-04-08 | Disposition: A | Discharge status: Home / Self Care | Payer: BC Managed Care – PPO | Source: Ambulatory Visit | Attending: Orthopedic Surgery | Admitting: Orthopedic Surgery

## 2012-04-08 ENCOUNTER — Encounter (HOSPITAL_BASED_OUTPATIENT_CLINIC_OR_DEPARTMENT_OTHER): Payer: Self-pay | Admitting: Certified Registered Nurse Anesthetist

## 2012-04-08 ENCOUNTER — Encounter (HOSPITAL_BASED_OUTPATIENT_CLINIC_OR_DEPARTMENT_OTHER): Payer: Self-pay | Admitting: Orthopedic Surgery

## 2012-04-08 ENCOUNTER — Encounter (HOSPITAL_BASED_OUTPATIENT_CLINIC_OR_DEPARTMENT_OTHER): Payer: Self-pay | Admitting: *Deleted

## 2012-04-08 DIAGNOSIS — G5600 Carpal tunnel syndrome, unspecified upper limb: Secondary | ICD-10-CM | POA: Insufficient documentation

## 2012-04-08 HISTORY — PX: CARPAL TUNNEL RELEASE: SHX101

## 2012-04-08 LAB — POCT HEMOGLOBIN-HEMACUE: Hemoglobin: 14.1 g/dL (ref 12.0–15.0)

## 2012-04-08 SURGERY — CARPAL TUNNEL RELEASE
Anesthesia: General | Site: Hand | Laterality: Right | Wound class: Clean

## 2012-04-08 MED ORDER — SCOPOLAMINE 1 MG/3DAYS TD PT72
1.0000 | MEDICATED_PATCH | Freq: Once | TRANSDERMAL | Status: DC
  Administered 2012-04-08: 1.5 mg via TRANSDERMAL

## 2012-04-08 MED ORDER — HYDROMORPHONE HCL PF 1 MG/ML IJ SOLN
0.2500 mg | INTRAMUSCULAR | Status: DC | PRN
  Administered 2012-04-08: 0.5 mg via INTRAVENOUS

## 2012-04-08 MED ORDER — OXYCODONE HCL 5 MG/5ML PO SOLN: 5.0000 mg | Freq: Once | ORAL | Status: DC | PRN

## 2012-04-08 MED ORDER — PROPOFOL 10 MG/ML IV EMUL
INTRAVENOUS | Status: DC | PRN
  Administered 2012-04-08: 200 mg via INTRAVENOUS

## 2012-04-08 MED ORDER — ONDANSETRON HCL 4 MG/2ML IJ SOLN
INTRAMUSCULAR | Status: DC | PRN
  Administered 2012-04-08: 4 mg via INTRAVENOUS

## 2012-04-08 MED ORDER — FENTANYL CITRATE 0.05 MG/ML IJ SOLN
INTRAMUSCULAR | Status: DC | PRN
  Administered 2012-04-08: 25 ug via INTRAVENOUS
  Administered 2012-04-08: 50 ug via INTRAVENOUS

## 2012-04-08 MED ORDER — OXYCODONE HCL 5 MG PO TABS: 5.0000 mg | ORAL_TABLET | Freq: Once | ORAL | Status: DC | PRN

## 2012-04-08 MED ORDER — LIDOCAINE HCL 2 % IJ SOLN
INTRAMUSCULAR | Status: DC | PRN
  Administered 2012-04-08: 4 mL

## 2012-04-08 MED ORDER — CHLORHEXIDINE GLUCONATE 4 % EX LIQD: 60.0000 mL | Freq: Once | CUTANEOUS | Status: DC

## 2012-04-08 MED ORDER — HYDROCODONE-ACETAMINOPHEN 5-325 MG PO TABS: ORAL_TABLET | ORAL | Status: AC

## 2012-04-08 MED ORDER — LACTATED RINGERS IV SOLN
INTRAVENOUS | Status: DC
  Administered 2012-04-08 (×2): via INTRAVENOUS

## 2012-04-08 MED ORDER — MIDAZOLAM HCL 5 MG/5ML IJ SOLN
INTRAMUSCULAR | Status: DC | PRN
  Administered 2012-04-08: 2 mg via INTRAVENOUS

## 2012-04-08 MED ORDER — DEXAMETHASONE SODIUM PHOSPHATE 10 MG/ML IJ SOLN
INTRAMUSCULAR | Status: DC | PRN
  Administered 2012-04-08: 10 mg via INTRAVENOUS

## 2012-04-08 MED ORDER — LIDOCAINE HCL (CARDIAC) 20 MG/ML IV SOLN
INTRAVENOUS | Status: DC | PRN
  Administered 2012-04-08: 30 mg via INTRAVENOUS

## 2012-04-08 MED ORDER — METOCLOPRAMIDE HCL 5 MG/ML IJ SOLN: 10.0000 mg | Freq: Once | INTRAMUSCULAR | Status: DC | PRN

## 2012-04-08 SURGICAL SUPPLY — 36 items
BANDAGE ADHESIVE 1X3 (GAUZE/BANDAGES/DRESSINGS) IMPLANT
BANDAGE ELASTIC 3 VELCRO ST LF (GAUZE/BANDAGES/DRESSINGS) ×2 IMPLANT
BLADE SURG 15 STRL LF DISP TIS (BLADE) ×1 IMPLANT
BLADE SURG 15 STRL SS (BLADE) ×2
BNDG CMPR 9X4 STRL LF SNTH (GAUZE/BANDAGES/DRESSINGS)
BNDG ESMARK 4X9 LF (GAUZE/BANDAGES/DRESSINGS) IMPLANT
BRUSH SCRUB EZ PLAIN DRY (MISCELLANEOUS) ×2 IMPLANT
CLOTH BEACON ORANGE TIMEOUT ST (SAFETY) ×2 IMPLANT
CORDS BIPOLAR (ELECTRODE) IMPLANT
COVER MAYO STAND STRL (DRAPES) ×2 IMPLANT
COVER TABLE BACK 60X90 (DRAPES) ×2 IMPLANT
CUFF TOURNIQUET SINGLE 18IN (TOURNIQUET CUFF) IMPLANT
DECANTER SPIKE VIAL GLASS SM (MISCELLANEOUS) IMPLANT
DRAPE EXTREMITY T 121X128X90 (DRAPE) ×2 IMPLANT
DRAPE SURG 17X23 STRL (DRAPES) ×2 IMPLANT
GLOVE BIOGEL M STRL SZ7.5 (GLOVE) ×2 IMPLANT
GLOVE ORTHO TXT STRL SZ7.5 (GLOVE) ×2 IMPLANT
GOWN PREVENTION PLUS XLARGE (GOWN DISPOSABLE) ×2 IMPLANT
GOWN PREVENTION PLUS XXLARGE (GOWN DISPOSABLE) ×4 IMPLANT
NEEDLE 27GAX1X1/2 (NEEDLE) IMPLANT
PACK BASIN DAY SURGERY FS (CUSTOM PROCEDURE TRAY) ×2 IMPLANT
PAD CAST 3X4 CTTN HI CHSV (CAST SUPPLIES) ×1 IMPLANT
PADDING CAST ABS 4INX4YD NS (CAST SUPPLIES) ×1
PADDING CAST ABS COTTON 4X4 ST (CAST SUPPLIES) ×1 IMPLANT
PADDING CAST COTTON 3X4 STRL (CAST SUPPLIES) ×2
SPLINT PLASTER CAST XFAST 3X15 (CAST SUPPLIES) ×5 IMPLANT
SPLINT PLASTER XTRA FASTSET 3X (CAST SUPPLIES) ×5
SPONGE GAUZE 4X4 12PLY (GAUZE/BANDAGES/DRESSINGS) ×2 IMPLANT
STOCKINETTE 4X48 STRL (DRAPES) ×2 IMPLANT
STRIP CLOSURE SKIN 1/2X4 (GAUZE/BANDAGES/DRESSINGS) ×2 IMPLANT
SUT PROLENE 3 0 PS 2 (SUTURE) ×2 IMPLANT
SYR 3ML 23GX1 SAFETY (SYRINGE) IMPLANT
SYR CONTROL 10ML LL (SYRINGE) IMPLANT
TRAY DSU PREP LF (CUSTOM PROCEDURE TRAY) ×2 IMPLANT
UNDERPAD 30X30 INCONTINENT (UNDERPADS AND DIAPERS) ×2 IMPLANT
WATER STERILE IRR 1000ML POUR (IV SOLUTION) ×2 IMPLANT

## 2012-04-08 NOTE — Anesthesia Procedure Notes (Signed)
Procedure Name: LMA Insertion Date/Time: 04/08/2012 8:42 AM Performed by: Aquarius Tremper D Pre-anesthesia Checklist: Patient identified, Emergency Drugs available, Suction available and Patient being monitored Patient Re-evaluated:Patient Re-evaluated prior to inductionOxygen Delivery Method: Circle System Utilized Preoxygenation: Pre-oxygenation with 100% oxygen Intubation Type: IV induction Ventilation: Mask ventilation without difficulty LMA: LMA inserted LMA Size: 4.0 Number of attempts: 1 Airway Equipment and Method: bite block Placement Confirmation: positive ETCO2 Tube secured with: Tape Dental Injury: Teeth and Oropharynx as per pre-operative assessment

## 2012-04-08 NOTE — Op Note (Signed)
NAME:  Tracey Santiago, Tracey Santiago               ACCOUNT NO.:  [id number]  MEDICAL RECORD NO.:  [id number]  LOCATION:                                 FACILITY:  PHYSICIAN:  Katy Fitch. Akhila Mahnken, M.D. DATE OF BIRTH:  25-Apr-1956  DATE OF PROCEDURE:  04/08/2012 DATE OF DISCHARGE:                              OPERATIVE REPORT   PREOPERATIVE DIAGNOSIS:  Entrapment neuropathy, median nerve, right carpal tunnel.  POSTOPERATIVE DIAGNOSIS:  Entrapment neuropathy, median nerve, right carpal tunnel.  OPERATION:  Release of right transverse carpal ligament.  OPERATING SURGEON:  Katy Fitch. Mikale Silversmith, MD  ASSISTANT:  Marveen Reeks Dasnoit, PA-C  ANESTHESIA:  General by LMA.  SUPERVISING ANESTHESIOLOGIST:  Dr. Gelene Mink.  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  She is status post prior left carpal tunnel release with satisfactory result.  She now presents for release of her right transverse carpal ligament.  PROCEDURE:  [name] was interviewed in the holding area.  A proper surgical site identification protocol was accomplished marking the right hand.  Following detailed anesthesia informed consent, general anesthesia by LMA technique was accepted by [name].  She was transferred to room 2 where under Dr. Thornton Dales direct supervision, general anesthesia by LMA technique was induced.  The right arm was then prepped with Betadine soap and solution, sterilely draped.  A pneumatic tourniquet was applied to the proximal right brachium.  Following exsanguination right arm with Esmarch bandage, arterial tourniquet was inflated to 220 mmHg.  Following routine surgical time- out, procedure commenced with a short incision in the line of the ring finger and the palm.  Subcutaneous tissues were carefully divided taking care to identify subcutaneous vessels and electrocautery with bipolar forceps including the  subdermal plexus.  The palmar fascia was identified, split longitudinally.  The common sensory branch of the median nerve identified distally and followed back to the median nerve proper.  The nerve was then separated from the transcarpal ligament with a Insurance risk surveyor.  Scissors were used to release transverse carpal ligament proximally including the volar forearm fascia.  After release of the ligament, the carpal canal was inspected and no masses were noted.  The ulnar bursa was fibrotic and opaque.  Bleeding points along the margin of the released ligament were electrocauterized with bipolar current followed by repair of the skin with intradermal 3-0 Prolene suture.  Compressive dressing was supplied, a volar plaster splint maintaining the wrist in 15 degrees of dorsiflexion.  For aftercare, [name], 20 tablets without refill.     Katy Fitch Addilyne Backs, M.D.     RVS/MEDQ  D:  04/08/2012  T:  04/08/2012  Job:  [id number]

## 2012-04-08 NOTE — Transfer of Care (Signed)
Immediate Anesthesia Transfer of Care Note  Patient: Tracey Santiago  Procedure(s) Performed: Procedure(s) (LRB): CARPAL TUNNEL RELEASE (Right)  Patient Location: PACU  Anesthesia Type: General  Level of Consciousness: awake, alert , oriented and patient cooperative  Airway & Oxygen Therapy: Patient Spontanous Breathing and Patient connected to face mask oxygen  Post-op Assessment: Report given to PACU RN and Post -op Vital signs reviewed and stable  Post vital signs: Reviewed and stable  Complications: No apparent anesthesia complications

## 2012-04-08 NOTE — Op Note (Signed)
[id number] 

## 2012-04-08 NOTE — Anesthesia Postprocedure Evaluation (Signed)
Anesthesia Post Note  Patient: Tracey Santiago  Procedure(s) Performed: Procedure(s) (LRB): CARPAL TUNNEL RELEASE (Right)  Anesthesia type: General  Patient location: PACU  Post pain: Pain level controlled  Post assessment: Patient's Cardiovascular Status Stable  Last Vitals:  Filed Vitals:   04/08/12 0945  BP: 141/66  Pulse: 55  Temp:   Resp: 13    Post vital signs: Reviewed and stable  Level of consciousness: alert  Complications: No apparent anesthesia complications

## 2012-04-08 NOTE — Anesthesia Preprocedure Evaluation (Signed)
Anesthesia Evaluation  Patient identified by MRN, date of birth, ID band Patient awake    Reviewed: Allergy & Precautions, H&P , NPO status , Patient's Chart, lab work & pertinent test results, reviewed documented beta blocker date and time   History of Anesthesia Complications (+) PONV  Airway Mallampati: II TM Distance: >3 FB Neck ROM: full    Dental   Pulmonary neg pulmonary ROS,  breath sounds clear to auscultation        Cardiovascular negative cardio ROS  Rhythm:regular     Neuro/Psych  Neuromuscular disease negative neurological ROS  negative psych ROS   GI/Hepatic negative GI ROS, Neg liver ROS,   Endo/Other  negative endocrine ROS  Renal/GU negative Renal ROS  negative genitourinary   Musculoskeletal   Abdominal   Peds  Hematology negative hematology ROS (+)   Anesthesia Other Findings See surgeon's H&P   Reproductive/Obstetrics negative OB ROS                           Anesthesia Physical Anesthesia Plan  ASA: II  Anesthesia Plan: General   Post-op Pain Management:    Induction: Intravenous  Airway Management Planned: LMA  Additional Equipment:   Intra-op Plan:   Post-operative Plan: Extubation in OR  Informed Consent: I have reviewed the patients History and Physical, chart, labs and discussed the procedure including the risks, benefits and alternatives for the proposed anesthesia with the patient or authorized representative who has indicated his/her understanding and acceptance.   Dental Advisory Given  Plan Discussed with: CRNA and Surgeon  Anesthesia Plan Comments:         Anesthesia Quick Evaluation

## 2012-04-08 NOTE — Brief Op Note (Signed)
04/08/2012  9:03 AM  PATIENT:  Tracey Santiago  55 y.o. female  PRE-OPERATIVE DIAGNOSIS:  right carpal tunnel syndrome  POST-OPERATIVE DIAGNOSIS:  right carpal tunnel syndrome  PROCEDURE:  Procedure(s) (LRB): CARPAL TUNNEL RELEASE (Right)  SURGEON:  Surgeon(s) and Role:    * Wyn Forster., MD - Primary  PHYSICIAN ASSISTANT:   ASSISTANTS: Mallory Shirk.A-C   ANESTHESIA:   general  EBL:     BLOOD ADMINISTERED:none  DRAINS: none   LOCAL MEDICATIONS USED:  XYLOCAINE   SPECIMEN:  No Specimen  DISPOSITION OF SPECIMEN:  N/A  COUNTS:  YES  TOURNIQUET:   Total Tourniquet Time Documented: Upper Arm (Right) - 9 minutes  DICTATION: .Other Dictation: Dictation Number [id number]  PLAN OF CARE: Discharge to home after PACU  PATIENT DISPOSITION:  PACU - hemodynamically stable.

## 2012-04-09 ENCOUNTER — Encounter (HOSPITAL_BASED_OUTPATIENT_CLINIC_OR_DEPARTMENT_OTHER): Payer: Self-pay | Admitting: Orthopedic Surgery

## 2012-05-19 ENCOUNTER — Encounter: Payer: Self-pay | Admitting: Family Medicine

## 2012-05-19 LAB — HM MAMMOGRAPHY: HM Mammogram: NEGATIVE

## 2012-06-15 HISTORY — PX: HAMMER TOE SURGERY: SHX385

## 2012-07-28 LAB — COMPREHENSIVE METABOLIC PANEL
%SAT: 9
ALT: 18 U/L (ref 7–35)
AST: 18 U/L
Albumin: 4.1
Alkaline Phosphatase: 37 U/L
Anion gap: 9
BUN: 19 mg/dL (ref 4–21)
CO2: 23 mmol/L
Calcium: 9.4 mg/dL
Chloride: 107 mmol/L
Creat: 0.9
Ferritin: 9 ng/mL (ref 9.0–150.0)
Glucose: 103
Hgb A1c MFr Bld: 7.1 % — AB (ref 4.0–6.0)
Iron: 36
Potassium: 5 mmol/L
Protein: 6.6
Sodium: 139 mmol/L (ref 137–147)
TIBC: 408
Total Bilirubin: 0.4 mg/dL
Transferrin: 344
UIBC: 372

## 2012-07-28 LAB — CBC
HCT: 31 %
Hemoglobin: 9.7 g/dL — AB (ref 12.0–16.0)
WBC: 8.2
platelet count: 357

## 2012-07-29 ENCOUNTER — Other Ambulatory Visit (INDEPENDENT_AMBULATORY_CARE_PROVIDER_SITE_OTHER): Payer: BC Managed Care – PPO

## 2012-07-29 DIAGNOSIS — Z23 Encounter for immunization: Secondary | ICD-10-CM

## 2012-07-29 DIAGNOSIS — E7800 Pure hypercholesterolemia, unspecified: Secondary | ICD-10-CM

## 2012-07-29 DIAGNOSIS — R7301 Impaired fasting glucose: Secondary | ICD-10-CM

## 2012-07-30 LAB — LIPID PANEL
Cholesterol: 216 mg/dL — ABNORMAL HIGH (ref 0–200)
HDL: 93 mg/dL (ref >39)
LDL Cholesterol: 111 mg/dL — ABNORMAL HIGH (ref 0–99)
Total CHOL/HDL Ratio: 2.3 Ratio
Triglycerides: 59 mg/dL (ref <150)
VLDL: 12 mg/dL (ref 0–40)

## 2012-07-30 LAB — HEMOGLOBIN A1C
Hgb A1c MFr Bld: 5.8 % — ABNORMAL HIGH (ref <5.7)
Mean Plasma Glucose: 120 mg/dL — ABNORMAL HIGH (ref <117)

## 2012-07-30 LAB — GLUCOSE, RANDOM: Glucose, Bld: 111 mg/dL — ABNORMAL HIGH (ref 70–99)

## 2012-08-04 ENCOUNTER — Ambulatory Visit (INDEPENDENT_AMBULATORY_CARE_PROVIDER_SITE_OTHER): Payer: BC Managed Care – PPO | Admitting: Family Medicine

## 2012-08-04 ENCOUNTER — Encounter: Payer: Self-pay | Admitting: Family Medicine

## 2012-08-04 VITALS — BP 120/74 | HR 64 | Ht 69.0 in | Wt 140.0 lb

## 2012-08-04 DIAGNOSIS — E785 Hyperlipidemia, unspecified: Secondary | ICD-10-CM

## 2012-08-04 DIAGNOSIS — R7301 Impaired fasting glucose: Secondary | ICD-10-CM | POA: Insufficient documentation

## 2012-08-04 NOTE — Progress Notes (Signed)
Chief Complaint  Patient presents with  . Follow-up    6 month follow up for chol and blood sugar-labs done. Would like to discuss shingles vaccine(given flu shot at lab visit 07/29/12)   She was off her feet for the last 7 weeks, related to foot surgery (hammer toes).  Just had pins out last week.  She also had bilateral carpal tunnel release (the right hand being the most recent, this past summer).  She also just started PT for her R hip.  She has known arthritis and bone spur in hip.  She reduced her meat intake since last visit.  She follows a low cholesterol diet.  Had swelling of face, very painful in June of last year.  Eventually saw derm--?shingles, but doubted.  She then looked into getting shingles vaccine--states covered by her insurance.  She had flu shot <1 month ago (recently with labs).  She now realizes that it likely wasn't Shingles, but would like the vaccine.  She was treated with steroids (with very quick resolution of swelling).  No steroids (oral, or injectable) since early summer.  She has a family history of diabetes, and is here to f/u on her elevated fasting sugars.  Past Medical History  Diagnosis Date  . Post-operative nausea and vomiting   . Arthritis     spine, right hand, hip  . Carpal tunnel syndrome, left 10/2011  . DDD (degenerative disc disease), cervical     C4-5, C5-6, some oseophytosis, foraminal narrowing C3-4, C4-5  . Cervical cancer 2005   Past Surgical History  Procedure Date  . Cesarean section   . Tonsillectomy   . Abdominal hysterectomy 05/28/2004    pelvic lymphadenectomy, ovarian transposition, suprapubic cath. placement  . Posterior laminectomy / decompression lumbar spine 07/21/2011    arthrodesis L4-5 (Dr. Danielle Dess)  . Carpal tunnel release 11/07/2011    Procedure: CARPAL TUNNEL RELEASE;  Surgeon: Wyn Forster., MD;  Location: [facility];  Service: Orthopedics;  Laterality: Left;  . Breast surgery 1987    breast  augmentation, implants replaced 11/2008  . Carpal tunnel release 04/08/2012    Procedure: CARPAL TUNNEL RELEASE;  Surgeon: Wyn Forster., MD;  Location: [facility];  Service: Orthopedics;  Laterality: Right;   History   Social History  . Marital Status: Married    Spouse Name: N/A    Number of Children: 3  . Years of Education: N/A   Occupational History  . Works at [employer]    Social History Main Topics  . Smoking status: Former Smoker    Quit date: 07/13/1996  . Smokeless tobacco: Never Used  . Alcohol Use: 0.0 oz/week     Comment: daily - 2-3 drinks  . Drug Use: No  . Sexually Active: Yes -- Female partner(s)    Birth Control/ Protection: Surgical   Other Topics Concern  . Not on file   Social History Narrative   Lives with husband.  Daughter in [place],  2 sons in [place]. 1 dog   Family History  Problem Relation Age of Onset  . Osteoporosis Mother   . Arthritis Mother   . Diabetes Mother   . Hearing loss Mother   . Hypertension Father   . Cancer Father   . Heart disease Father 74    CABG  . Peripheral vascular disease Father     s/p bypasses    Current Outpatient Prescriptions on File Prior to Visit  Medication Sig  Dispense Refill  . Ascorbic Acid (VITAMIN C) 1000 MG tablet Take 1,000 mg by mouth daily.        Marland Kitchen b complex vitamins tablet Take 1 tablet by mouth daily.      . calcium carbonate (TUMS - DOSED IN MG ELEMENTAL CALCIUM) 500 MG chewable tablet Chew 2 tablets by mouth daily.        Marland Kitchen MAGNESIUM PO Take 2 tablets by mouth daily.       . OMEGA 3 1200 MG CAPS Take 2 capsules by mouth daily.        . polyethylene glycol powder (GLYCOLAX/MIRALAX) powder Take 17 g by mouth every other day.        . vitamin D, CHOLECALCIFEROL, 400 UNITS tablet Take 400 Units by mouth daily.        Marland Kitchen ALPRAZolam (XANAX) 0.5 MG tablet Take 0.5-1 tablets (0.25-0.5 mg total) by mouth every 12 (twelve) hours as needed. for anxiety  30 tablet  0  .  Glucosamine Sulfate 500 MG CAPS Take 1 capsule by mouth daily.      No Known Allergies  ROS:  Denies vision changes, fevers, URI symptoms, chest pain, headaches, shortness of breath, skin rash, GI complaints or other concerns.  R hip pain.  PHYSICAL EXAM: BP 120/74  Pulse 64  Ht 5\' 9"  (1.753 m)  Wt 140 lb (63.504 kg)  BMI 20.67 kg/m2 Well developed, pleasant female in no distress Neck: no lymphadenopathy, thyromegaly or mass Heart: regular rate and rhythm without murmur Lungs: clear Skin: no rash Psych: mildly anxious, otherwise normal Neuro: alert and oriented.  Very mildly antalgic gait (due to hip pain).  Cranial nerves intact. Alert and oriented.  Lab Results  Component Value Date   HGBA1C 5.8* 07/29/2012   Fasting sugar 111  Lab Results  Component Value Date   CHOL 216* 07/29/2012   CHOL 250* 01/22/2012   Lab Results  Component Value Date   HDL 93 07/29/2012   HDL 108 01/22/2012   Lab Results  Component Value Date   LDLCALC 111* 07/29/2012   LDLCALC 131* 01/22/2012   Lab Results  Component Value Date   TRIG 59 07/29/2012   TRIG 55 01/22/2012   Lab Results  Component Value Date   CHOLHDL 2.3 07/29/2012   CHOLHDL 2.3 01/22/2012   No results found for this basename: LDLDIRECT   ASSESSMENT/PLAN:  1. Impaired fasting glucose   2. Hyperlipidemia    excellent HDL.  LDL improved with dietary changes    Impaired fasting glucose.  We discussed the implications of this, and the potential to eventually progress to diabetes.  Continue regular exercise, maintain weight, reviewed low sugar, low carb diet.  All questions answered.  Recheck A1c at CPE in 6 months, along with fasting sugar.  Her A1c was slightly higher than at last check, likely related to significantly less exercise recently related to foot surgery.  Counseled re: risks/benefits of shingles vaccine. Prefers to hold off until her CPE, due to upcoming family reunion with pregnant niece.  Advised she can return  sooner for nurse visit if desired. Unble to give today due to recent flu shot--needs to wait a month after flu shot.  Hyperlipidemia--excellent HDL.  Improved overall.  I do not recommend medications at this point.  Counseled extensively re: IFG, diabetes.   Return 6 months for CPE.  Give zostavax at visit.  Check glu and A1c at visit.  No need to repeat lipids at that time, can be  done once yearly.

## 2012-08-04 NOTE — Patient Instructions (Addendum)
  Lab Results  Component Value Date   HGBA1C 5.8* 07/29/2012   Fasting sugar 111  Lab Results  Component Value Date   CHOL 216* 07/29/2012   CHOL 250* 01/22/2012   Lab Results  Component Value Date   HDL 93 07/29/2012   HDL 108 01/22/2012   Lab Results  Component Value Date   LDLCALC 111* 07/29/2012   LDLCALC 131* 01/22/2012   Lab Results  Component Value Date   TRIG 59 07/29/2012   TRIG 55 01/22/2012   Lab Results  Component Value Date   CHOLHDL 2.3 07/29/2012   CHOLHDL 2.3 01/22/2012   No results found for this basename: LDLDIRECT   Keep up the good work with diet and exercise.

## 2012-08-17 NOTE — Progress Notes (Signed)
Quick Note:  Reviewed at OV ______ 

## 2013-01-31 ENCOUNTER — Other Ambulatory Visit (HOSPITAL_COMMUNITY)
Admission: RE | Admit: 2013-01-31 | Discharge: 2013-01-31 | Disposition: A | Discharge status: Home / Self Care | Payer: BC Managed Care – PPO | Source: Ambulatory Visit | Attending: Family Medicine | Admitting: Family Medicine

## 2013-01-31 ENCOUNTER — Encounter: Payer: Self-pay | Admitting: Family Medicine

## 2013-01-31 ENCOUNTER — Ambulatory Visit (INDEPENDENT_AMBULATORY_CARE_PROVIDER_SITE_OTHER): Payer: BC Managed Care – PPO | Admitting: Family Medicine

## 2013-01-31 VITALS — BP 122/70 | HR 64 | Ht 67.0 in | Wt 132.0 lb

## 2013-01-31 DIAGNOSIS — F411 Generalized anxiety disorder: Secondary | ICD-10-CM

## 2013-01-31 DIAGNOSIS — G4700 Insomnia, unspecified: Secondary | ICD-10-CM

## 2013-01-31 DIAGNOSIS — Z8541 Personal history of malignant neoplasm of cervix uteri: Secondary | ICD-10-CM

## 2013-01-31 DIAGNOSIS — R5381 Other malaise: Secondary | ICD-10-CM

## 2013-01-31 DIAGNOSIS — R7301 Impaired fasting glucose: Secondary | ICD-10-CM

## 2013-01-31 DIAGNOSIS — Z01419 Encounter for gynecological examination (general) (routine) without abnormal findings: Secondary | ICD-10-CM | POA: Insufficient documentation

## 2013-01-31 DIAGNOSIS — Z0000 Encounter for general adult medical examination without abnormal findings: Secondary | ICD-10-CM

## 2013-01-31 LAB — POCT URINALYSIS DIPSTICK
Bilirubin, UA: NEGATIVE
Blood, UA: NEGATIVE
Glucose, UA: NEGATIVE
Ketones, UA: NEGATIVE
Leukocytes, UA: NEGATIVE
Nitrite, UA: NEGATIVE
Protein, UA: NEGATIVE
Spec Grav, UA: <=1.005
Urobilinogen, UA: NEGATIVE
pH, UA: 6

## 2013-01-31 LAB — POCT GLYCOSYLATED HEMOGLOBIN (HGB A1C): Hemoglobin A1C: 5.7

## 2013-01-31 LAB — HM PAP SMEAR: HM Pap smear: NORMAL

## 2013-01-31 MED ORDER — ALPRAZOLAM 0.5 MG PO TABS: 0.2500 mg | ORAL_TABLET | Freq: Two times a day (BID) | ORAL | Status: DC | PRN

## 2013-01-31 NOTE — Progress Notes (Signed)
Chief Complaint  Patient presents with  . Annual Exam    fasting annual exam with pelvic exam. Just had full eye exam 2 weeks ago with Dr.Sally Hyacinth Meeker. Requesting refill on xanax. Also mentioned throat tightness that she wanted to mention since she did have cervical cancer, is afraid that she could get throat cancer. Wants to have zostavax today but will be around 41 month old baby in 2 weeks.   Tracey Santiago is a 57 y.o. female who presents for a complete physical.  She has the following concerns:  Insomnia, related to anxiety. She is asking for refill of xanax. She was given rx for #30 last year, has been out for a while, but feels like she needs to use it about twice a week.  She thinks that just having the med might ease her anxiety, and that she might not truly need it that often.    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  She had read about increased risk of other cancers, and anxiety is making her worry about it.  Slight tightness, slight hoarseness.  She sings a lot and thinks perhaps this might be causing her symptoms rather than cancer.  Denies reflux.  Symptoms are intermittent, and extremely mild, but she is worried about.  Chart reviewed--2 A1c's done in November, one by another provider that I wasn't aware of at her last visit.  Very different results--5.8 by [place] and 7.1 by another provider.  Hg was noted to be abnormal at 9.7 by the other provider as well (after pins removed?)  She complains of some fatigue, partly related to trouble sleeping.  Denies snoring.  Health maintenance: Immunization History  Administered Date(s) Administered  . Influenza Split 07/29/2012  . Tdap 09/15/2008   Last Pap smear: 01/2012  Last mammogram: 05/2012 Last colonoscopy: 11/07 --believes it was normal. She recalls getting a letter stating they recommend doing repeat at 7 years, but unclear why. Last DEXA: never  Dentist: twice yearly  Ophtho: yearly, recently  went Exercise: walks 5-6 miles/day in total (much through work, then walks 2 miles/day).  She has been working with trainer, going to the gym doing weights and doing the elliptical  Past Medical History  Diagnosis Date  . Post-operative nausea and vomiting   . Arthritis     spine, right hand, hip  . Carpal tunnel syndrome, bilateral   . DDD (degenerative disc disease), cervical     C4-5, C5-6, some oseophytosis, foraminal narrowing C3-4, C4-5  . Cervical cancer 2005    Past Surgical History  Procedure Laterality Date  . Cesarean section    . Tonsillectomy    . Abdominal hysterectomy  05/28/2004    pelvic lymphadenectomy, ovarian transposition, suprapubic cath. placement  . Posterior laminectomy / decompression lumbar spine  07/21/2011    arthrodesis L4-5 (Dr. Danielle Dess)  . Carpal tunnel release  11/07/2011    Procedure: CARPAL TUNNEL RELEASE;  Surgeon: Wyn Forster., MD;  Location: [facility];  Service: Orthopedics;  Laterality: Left;  . Breast surgery  1987    breast augmentation, implants replaced 11/2008  . Carpal tunnel release  04/08/2012    Procedure: CARPAL TUNNEL RELEASE;  Surgeon: Wyn Forster., MD;  Location: [facility];  Service: Orthopedics;  Laterality: Right;  . Hammer toe surgery Left 06/2012    History   Social History  . Marital Status: Married    Spouse Name: N/A    Number of  Children: 3  . Years of Education: N/A   Occupational History  . Works at [employer]    Social History Main Topics  . Smoking status: Former Smoker    Quit date: 07/13/1996  . Smokeless tobacco: Never Used  . Alcohol Use: 0.0 oz/week     Comment: 5 drinks/week  . Drug Use: No  . Sexually Active: Yes -- Female partner(s)    Birth Control/ Protection: Surgical   Other Topics Concern  . Not on file   Social History Narrative   Lives with husband.  Daughter in [place],  2 sons in [place]. 1 dog    Family History  Problem Relation  Age of Onset  . Osteoporosis Mother   . Arthritis Mother   . Diabetes Mother   . Hearing loss Mother   . Hypertension Father   . Cancer Father   . Heart disease Father 28    CABG  . Peripheral vascular disease Father     s/p bypasses  . Alcohol abuse Sister     recovered  . Urolithiasis Sister     Current outpatient prescriptions:calcium carbonate (TUMS - DOSED IN MG ELEMENTAL CALCIUM) 500 MG chewable tablet, Chew 2 tablets by mouth daily.  , Disp: , Rfl: ;  MAGNESIUM PO, Take 2 tablets by mouth daily. , Disp: , Rfl: ;  OMEGA 3 1200 MG CAPS, Take 2 capsules by mouth daily.  , Disp: , Rfl: ;  polyethylene glycol powder (GLYCOLAX/MIRALAX) powder, Take 17 g by mouth every other day.  , Disp: , Rfl:  vitamin D, CHOLECALCIFEROL, 400 UNITS tablet, Take 400 Units by mouth daily.  , Disp: , Rfl: ;  vitamin E 400 UNIT capsule, Take 400 Units by mouth daily., Disp: , Rfl: ;  ALPRAZolam (XANAX) 0.5 MG tablet, Take 0.5-1 tablets (0.25-0.5 mg total) by mouth every 12 (twelve) hours as needed. for anxiety, Disp: 30 tablet, Rfl: 0  No Known Allergies   ROS:  Denies dizziness, syncope, dyspnea on exertion, cough, swelling, nausea, vomiting, diarrhea, constipation, abdominal pain, melena, hematochezia, indigestion/heartburn, hematuria, incontinence, dysuria, vaginal bleeding, discharge, odor or itch, genital lesions,numbness, tingling, weakness, tremor, suspicious skin lesions, depression, abnormal bleeding/bruising, or enlarged lymph nodes.  Numbness/tingling in both hands resolved s/p CT release surgery. Feet tingling resolved s/p surgery. Back pain is much improved s/p surgery. Hasn't had much neck pain from arthritis recently.  +h/o insomnia, previously took Ambien. She is having anxiety (and insomnia) about 2-3 times/week in the evenings for anxiety--feeling nervous, panicky.  Stress related to being responsible for mother's health (in assisted living), putting house on market, busy, lots to do  Hip  pain, occasionally with a lot of squatting, relieved by OTC advil. Some discomfort with intercourse--dryness, since surgery, and right hip pain   PHYSICAL EXAM:  BP 138/78  Pulse 64  Ht 5\' 7"  (1.702 m)  Wt 132 lb (59.875 kg)  BMI 20.67 kg/m2 122/70 on repeat by MD in [place]  General Appearance:  Alert, cooperative, no distress, appears stated age   Head:  Normocephalic, without obvious abnormality, atraumatic   Eyes:  PERRL, conjunctiva/corneas clear, EOM's intact, fundi  benign   Ears:  Normal TM's and external ear canals   Nose:  Nares normal, mucosa normal, no drainage or sinus tenderness   Throat:  Lips, mucosa, and tongue normal; teeth and gums normal   Neck:  Supple, no lymphadenopathy; thyroid: no enlargement/tenderness/nodules; no carotid  bruit or JVD   Back:  Spine nontender,  no curvature, ROM normal, no CVA tenderness   Lungs:  Clear to auscultation bilaterally without wheezes, rales or ronchi; respirations unlabored   Chest Wall:  No tenderness or deformity   Heart:  Regular rate and rhythm, S1 and S2 normal, no murmur, rub  or gallop   Breast Exam:  No tenderness, masses, or nipple discharge or inversion. No axillary lymphadenopathy. + implants   Abdomen:  Soft, non-tender, nondistended, normoactive bowel sounds,  no masses, no hepatosplenomegaly   Genitalia:  Normal external genitalia without lesions. BUS and vagina normal; No abnormal vaginal discharge. Uterus and ovaries are surgically absent, nontender, no mass. Vaginal cuff appears normal. Pap of vaginal cuff obtained.   Rectal:  Normal tone, no masses or tenderness; heme negative light brown stool   Extremities:  No clubbing, cyanosis or edema.  Pulses:  2+ and symmetric all extremities   Skin:  Skin color, texture, turgor normal, no rashes or lesions   Lymph nodes:  Cervical, supraclavicular, and axillary nodes normal   Neurologic:  CNII-XII intact, normal strength, sensation and gait; reflexes 2+ and symmetric  throughout           Psych: Normal mood, affect, hygiene and grooming.  Mildly anxious  Lab Results  Component Value Date   HGBA1C 5.7 01/31/2013     ASSESSMENT/PLAN:  Routine general medical examination at a health care facility - Plan: POCT Urinalysis Dipstick, Cytology - PAP [place]  Impaired fasting glucose - Plan: HgB A1c, Glucose, random  Other malaise and fatigue - Plan: CBC with Differential, TSH  History of cervical cancer - Plan: Cytology - PAP [place]  Anxiety state, unspecified - Plan: ALPRAZolam (XANAX) 0.5 MG tablet  Labs today--glucose, CBC (Hg was low on last check in November, which was shortly after pins were removed, so likely postsurgical and normal).   If ongoing anemia, needs further w/u/f/u with GI.  Anxiety--discussed in detail the recommendation for only prn use with xanax, and that is sounds like her anxiety might require a daily medication if truly needing xanax 2-3x/week (vs counseling, since she isn't interested in being "on any of those medications" that were mentioned for prevention).  Discussed risks of xanax. Insomnia--related to anxiety, but has also done well with Ambien.  Discussed reasons to avoid using Xanax nightly for sleep, affect on sleep. Discussed sleep hygiene, relaxation techiques  Discussed monthly self breast exams and yearly mammograms; at least 30 minutes of aerobic activity at least 5 days/week; proper sunscreen use reviewed; healthy diet, including goals of calcium and vitamin D intake and alcohol recommendations (less than or equal to 1 drink/day) reviewed; regular seatbelt use; changing batteries in smoke detectors. Immunization recommendations discussed--flu shots recommended. Colonoscopy recommendations reviewed; per pts history--states no polyps in past, no family h/o polyps or cancer, and therefore should be on every 10 year plan.  Hemoccult kit given.  Check with insurance re: coverage of shingles for age<60 (to verify),  and then return for NV after seeing baby.

## 2013-01-31 NOTE — Patient Instructions (Addendum)
HEALTH MAINTENANCE RECOMMENDATIONS:  It is recommended that you get at least 30 minutes of aerobic exercise at least 5 days/week (for weight loss, you may need as much as 60-90 minutes). This can be any activity that gets your heart rate up. This can be divided in 10-15 minute intervals if needed, but try and build up your endurance at least once a week.  Weight bearing exercise is also recommended twice weekly.  Eat a healthy diet with lots of vegetables, fruits and fiber.  "Colorful" foods have a lot of vitamins (ie green vegetables, tomatoes, red peppers, etc).  Limit sweet tea, regular sodas and alcoholic beverages, all of which has a lot of calories and sugar.  Up to 1 alcoholic drink daily may be beneficial for women (unless trying to lose weight, watch sugars).  Drink a lot of water.  Calcium recommendations are 1200-1500 mg daily (1500 mg for postmenopausal women or women without ovaries), and vitamin D 1000 IU daily.  This should be obtained from diet and/or supplements (vitamins), and calcium should not be taken all at once, but in divided doses.  Monthly self breast exams and yearly mammograms for women over the age of 80 is recommended.  Sunscreen of at least SPF 30 should be used on all sun-exposed parts of the skin when outside between the hours of 10 am and 4 pm (not just when at beach or pool, but even with exercise, golf, tennis, and yard work!)  Use a sunscreen that says "broad spectrum" so it covers both UVA and UVB rays, and make sure to reapply every 1-2 hours.  Remember to change the batteries in your smoke detectors when changing your clock times in the spring and fall.  Use your seat belt every time you are in a car, and please drive safely and not be distracted with cell phones and texting while driving.  Use the xanax sparingly, as needed for anxiety.  It is not recommended for regular use for insomnia.  If you have frequent trouble sleeping due to anxiety, consider daily  medication for prevention of anxiety (which will help for sleep) rather than continuing to use xanax frequently.  Consider counseling if ongoing anxiety.  Verify that your insurance covers the shingles vaccine for your age, and then schedule a nurse visit at your convenience.  It has to be a month apart from any other vaccine (or the same day)--I mention that only if you end up delaying getting the vaccine, and it ends up around your flu shot.

## 2013-02-01 LAB — CBC WITH DIFFERENTIAL/PLATELET
Basophils Absolute: 0 10*3/uL (ref 0.0–0.1)
Basophils Relative: 1 % (ref 0–1)
Eosinophils Absolute: 0 10*3/uL (ref 0.0–0.7)
Eosinophils Relative: 1 % (ref 0–5)
HCT: 40.5 % (ref 36.0–46.0)
Hemoglobin: 14 g/dL (ref 12.0–15.0)
Lymphocytes Relative: 28 % (ref 12–46)
Lymphs Abs: 1.7 10*3/uL (ref 0.7–4.0)
MCH: 32 pg (ref 26.0–34.0)
MCHC: 34.6 g/dL (ref 30.0–36.0)
MCV: 92.5 fL (ref 78.0–100.0)
Monocytes Absolute: 0.5 10*3/uL (ref 0.1–1.0)
Monocytes Relative: 9 % (ref 3–12)
Neutro Abs: 3.6 10*3/uL (ref 1.7–7.7)
Neutrophils Relative %: 61 % (ref 43–77)
Platelets: 262 10*3/uL (ref 150–400)
RBC: 4.38 MIL/uL (ref 3.87–5.11)
RDW: 13 % (ref 11.5–15.5)
WBC: 5.8 10*3/uL (ref 4.0–10.5)

## 2013-02-01 LAB — TSH: TSH: 1.086 u[IU]/mL (ref 0.350–4.500)

## 2013-02-01 LAB — GLUCOSE, RANDOM: Glucose, Bld: 115 mg/dL — ABNORMAL HIGH (ref 70–99)

## 2013-02-02 ENCOUNTER — Encounter: Payer: Self-pay | Admitting: Family Medicine

## 2013-07-21 ENCOUNTER — Other Ambulatory Visit: Payer: Self-pay

## 2013-10-05 LAB — HM MAMMOGRAPHY

## 2013-10-06 ENCOUNTER — Encounter: Payer: Self-pay | Admitting: Internal Medicine

## 2013-10-17 ENCOUNTER — Encounter: Payer: Self-pay | Admitting: Family Medicine

## 2013-10-17 ENCOUNTER — Ambulatory Visit (INDEPENDENT_AMBULATORY_CARE_PROVIDER_SITE_OTHER): Payer: BC Managed Care – PPO | Admitting: Family Medicine

## 2013-10-17 VITALS — BP 124/82 | HR 64 | Ht 67.0 in | Wt 134.0 lb

## 2013-10-17 DIAGNOSIS — G4700 Insomnia, unspecified: Secondary | ICD-10-CM

## 2013-10-17 DIAGNOSIS — R7301 Impaired fasting glucose: Secondary | ICD-10-CM

## 2013-10-17 DIAGNOSIS — E785 Hyperlipidemia, unspecified: Secondary | ICD-10-CM

## 2013-10-17 LAB — POCT CBG (FASTING - GLUCOSE)-MANUAL ENTRY: Glucose Fasting, POC: 118 mg/dL — AB (ref 70–99)

## 2013-10-17 LAB — POCT GLYCOSYLATED HEMOGLOBIN (HGB A1C): Hemoglobin A1C: 5.8

## 2013-10-17 NOTE — Progress Notes (Signed)
Chief Complaint  Patient presents with  . Follow-up    on pre-diabetes.   Patient presents for follow up on pre-diabetes.  She was supposed to have a 6 month f/u in November, but she was moving/downsizing, and very busy.  Her mother has diabetes, so is aware of proper diet.  She has changed her snacks--cut back on carbs, exercises regularly.    Insomnia was pretty bad during stress of moving (in December).  Improved now.  Uses Advil PM as needed, doesn't have any xanax, not needing..  Severe R hip pain.  Has f/u appt in March, likely will need THR.  She sees Dr. Alvan Dame She walks on the treadmill, averages 5-6 miles--gets 10-15,000 steps daily  Past Medical History  Diagnosis Date  . Post-operative nausea and vomiting   . Arthritis     spine, right hand, hip  . Carpal tunnel syndrome, bilateral   . DDD (degenerative disc disease), cervical     C4-5, C5-6, some oseophytosis, foraminal narrowing C3-4, C4-5  . Cervical cancer 2005  . Impaired fasting glucose    Past Surgical History  Procedure Laterality Date  . Cesarean section    . Tonsillectomy    . Abdominal hysterectomy  05/28/2004    pelvic lymphadenectomy, ovarian transposition, suprapubic cath. placement  . Posterior laminectomy / decompression lumbar spine  07/21/2011    arthrodesis L4-5 (Dr. Ellene Route)  . Carpal tunnel release  11/07/2011    Procedure: CARPAL TUNNEL RELEASE;  Surgeon: Cammie Sickle., MD;  Location: [facility];  Service: Orthopedics;  Laterality: Left;  . Breast surgery  1987    breast augmentation, implants replaced 11/2008  . Carpal tunnel release  04/08/2012    Procedure: CARPAL TUNNEL RELEASE;  Surgeon: Cammie Sickle., MD;  Location: [facility];  Service: Orthopedics;  Laterality: Right;  . Hammer toe surgery Left 06/2012   History   Social History  . Marital Status: Married    Spouse Name: N/A    Number of Children: 3  . Years of Education: N/A   Occupational  History  . Works at [employer]    Social History Main Topics  . Smoking status: Former Smoker    Quit date: 07/13/1996  . Smokeless tobacco: Never Used  . Alcohol Use: 0.0 oz/week     Comment: 5 drinks/week (light beer)  . Drug Use: No  . Sexual Activity: Yes    Partners: Male    Birth Control/ Protection: Surgical   Other Topics Concern  . Not on file   Social History Narrative   Lives with husband.  Daughter in [place],  2 sons in [place]. 1 dog   Current Outpatient Prescriptions on File Prior to Visit  Medication Sig Dispense Refill  . calcium carbonate (TUMS - DOSED IN MG ELEMENTAL CALCIUM) 500 MG chewable tablet Chew 2 tablets by mouth daily.        Marland Kitchen MAGNESIUM PO Take 2 tablets by mouth daily.       . OMEGA 3 1200 MG CAPS Take 2 capsules by mouth daily.        . polyethylene glycol powder (GLYCOLAX/MIRALAX) powder Take 17 g by mouth every other day.        . vitamin D, CHOLECALCIFEROL, 400 UNITS tablet Take 1,200 Units by mouth daily.       . vitamin E 400 UNIT capsule Take 400 Units by mouth daily.      Marland Kitchen ALPRAZolam (XANAX) 0.5  MG tablet Take 0.5-1 tablets (0.25-0.5 mg total) by mouth every 12 (twelve) hours as needed. for anxiety  30 tablet  0   No current facility-administered medications on file prior to visit.   (NOT using xanax)  No Known Allergies  ROS:  Denies fevers, weight changes, numbness, tingling.  +hip pain.  No URI symptoms, chest pain, palpitations, or other complaints.  PHYSICAL EXAM: BP 124/82  Pulse 64  Ht 5\' 7"  (1.702 m)  Wt 134 lb (60.782 kg)  BMI 20.98 kg/m2 Well developed, pleasant, thin female in no distress Neck: no lymphadenopathy, thyromegaly or mass Heart: regular rate and rhythm without murmur Lungs: clear bilaterally Psych: normal mood, affect, hygiene and grooming  Fasting glucose 118  Lab Results  Component Value Date   HGBA1C 5.8 10/17/2013   ASSESSMENT/PLAN:  IFG (impaired fasting glucose) - Plan: HgB A1c,  Glucose (CBG), Fasting  Insomnia  Impaired fasting glucose  Hyperlipidemia  Continue low carb, healthy diet, regular exercise and maintain healthy weight.  Discussed pathophysiology, and risk for developing diabetes.  Given normal A1c, no comorbidities, normal weight and blood pressure, do not think she needs metformin started at this time (no evidence of metabolic syndrome).  Insomnia--stable, doing well with OTC med prn  Hip arthritis--may need replacement soon.  No risks for surgery  F/u as scheduled in May for CPE; come fasting

## 2013-10-17 NOTE — Patient Instructions (Signed)
Keep up the good work--limiting sweets and sugars and carbs in your diet, and getting daily exercise.

## 2013-12-02 ENCOUNTER — Encounter (HOSPITAL_COMMUNITY): Payer: Self-pay | Admitting: Pharmacy Technician

## 2013-12-07 ENCOUNTER — Encounter (HOSPITAL_COMMUNITY): Payer: Self-pay

## 2013-12-07 ENCOUNTER — Inpatient Hospital Stay (HOSPITAL_COMMUNITY): Admission: RE | Admit: 2013-12-07 | Payer: BC Managed Care – PPO | Source: Ambulatory Visit

## 2013-12-07 ENCOUNTER — Telehealth: Payer: Self-pay | Admitting: Family Medicine

## 2013-12-07 ENCOUNTER — Encounter (HOSPITAL_COMMUNITY)
Admission: RE | Admit: 2013-12-07 | Discharge: 2013-12-07 | Disposition: A | Discharge status: Home / Self Care | Payer: BC Managed Care – PPO | Source: Ambulatory Visit | Attending: Orthopedic Surgery | Admitting: Orthopedic Surgery

## 2013-12-07 DIAGNOSIS — Z01812 Encounter for preprocedural laboratory examination: Secondary | ICD-10-CM | POA: Insufficient documentation

## 2013-12-07 LAB — CBC
HCT: 41.8 % (ref 36.0–46.0)
Hemoglobin: 14 g/dL (ref 12.0–15.0)
MCH: 31.7 pg (ref 26.0–34.0)
MCHC: 33.5 g/dL (ref 30.0–36.0)
MCV: 94.6 fL (ref 78.0–100.0)
Platelets: 251 10*3/uL (ref 150–400)
RBC: 4.42 MIL/uL (ref 3.87–5.11)
RDW: 12.4 % (ref 11.5–15.5)
WBC: 6.2 10*3/uL (ref 4.0–10.5)

## 2013-12-07 LAB — BASIC METABOLIC PANEL
BUN: 21 mg/dL (ref 6–23)
CO2: 26 mEq/L (ref 19–32)
Calcium: 9.9 mg/dL (ref 8.4–10.5)
Chloride: 100 mEq/L (ref 96–112)
Creatinine, Ser: 0.63 mg/dL (ref 0.50–1.10)
GFR calc Af Amer: >90 mL/min (ref >90)
GFR calc non Af Amer: >90 mL/min (ref >90)
Glucose, Bld: 114 mg/dL — ABNORMAL HIGH (ref 70–99)
Potassium: 4.6 mEq/L (ref 3.7–5.3)
Sodium: 139 mEq/L (ref 137–147)

## 2013-12-07 LAB — PROTIME-INR
INR: 0.95 (ref 0.00–1.49)
Prothrombin Time: 12.5 seconds (ref 11.6–15.2)

## 2013-12-07 LAB — APTT: aPTT: 30 seconds (ref 24–37)

## 2013-12-07 LAB — SURGICAL PCR SCREEN
MRSA, PCR: NEGATIVE
Staphylococcus aureus: NEGATIVE

## 2013-12-07 LAB — URINALYSIS, ROUTINE W REFLEX MICROSCOPIC
Bilirubin Urine: NEGATIVE
Glucose, UA: NEGATIVE mg/dL
Hgb urine dipstick: NEGATIVE
Ketones, ur: NEGATIVE mg/dL
Leukocytes, UA: NEGATIVE
Nitrite: NEGATIVE
Protein, ur: NEGATIVE mg/dL
Specific Gravity, Urine: 1.005 (ref 1.005–1.030)
Urobilinogen, UA: 0.2 mg/dL (ref 0.0–1.0)
pH: 6.5 (ref 5.0–8.0)

## 2013-12-07 LAB — ABO/RH: ABO/RH(D): A POS

## 2013-12-07 NOTE — Patient Instructions (Addendum)
[place]  12/07/2013   Your procedure is scheduled on:   12-13-2013  Report to [facility] at     [place]   AM.  Call this number if you have problems the morning of surgery: [phone]  Or Presurgical Testing [phone](Ladislao Cohenour) For Living Will and/or Health Care Power Attorney Forms: please provide copy for your medical record,may bring AM of surgery(Forms should be already notarized -we do not provide this service).(12-07-13 use given documents if desired)     Do not eat food:After Midnight.  .  Take these medicines the morning of surgery with A SIP OF WATER: Tylenol if needed   Do not wear jewelry, make-up or nail polish.  Do not wear lotions, powders, or perfumes. You may wear deodorant.  Do not shave 48 hours(2 days) prior to first CHG shower(legs and under arms).(Shaving face and neck okay.)  Do not bring valuables to the hospital.(Hospital is not responsible for lost valuables).  Contacts, dentures or removable bridgework, body piercing, hair pins may not be worn into surgery.  Leave suitcase in the car. After surgery it may be brought to your room.  For patients admitted to the hospital, checkout time is 11:00 AM the day of discharge.(Restricted visitors-Any Persons displaying flu-like symptoms or illness).    Patients discharged the day of surgery will not be allowed to drive home. Must have responsible person with you x 24 hours once discharged.  Name and phone number of your driver: Stamatia Masri [phone]  Special Instructions: CHG(Chlorhedine 4%-"Hibiclens","Betasept","Aplicare") Shower Use Special Wash: see special instructions.(avoid face and genitals)   Please read over the following fact sheets that you were given: MRSA Information, Blood Transfusion fact sheet, Incentive Spirometry Instruction.  Remember : Type/Screen "Blue armbands" - may not be removed once applied(would result in being retested AM of surgery, if removed).  Failure  to follow these instructions may result in Cancellation of your surgery.   Patient signature_______________________________________________________

## 2013-12-07 NOTE — Telephone Encounter (Signed)
This was sent to me. I looked back at [place] from 10/17/13 and it states no risks for surgery in your note-may I fax the note? Please advise, thanks.

## 2013-12-07 NOTE — Telephone Encounter (Signed)
Yes.  She is cleared medically for surgery, based on her last visit in February.

## 2013-12-11 NOTE — H&P (Signed)
TOTAL HIP ADMISSION H&P  Patient is admitted for right total hip arthroplasty, anterior approach.  Subjective:  Chief Complaint:    Right hip OA / pain  HPI: Tracey Santiago, 58 y.o. female, has a history of pain and functional disability in the right hip(s) due to arthritis and patient has failed non-surgical conservative treatments for greater than 12 weeks to include NSAID's and/or analgesics and activity modification.  Onset of symptoms was gradual starting 2+ years ago with gradually worsening course since that time.The patient noted no past surgery on the right hip(s).  Patient currently rates pain in the right hip at 9 out of 10 with activity. Patient has worsening of pain with activity and weight bearing, trendelenberg gait, pain that interfers with activities of daily living and pain with passive range of motion. Patient has evidence of periarticular osteophytes and joint space narrowing by imaging studies. This condition presents safety issues increasing the risk of falls.  There is no current active infection.  Risks, benefits and expectations were discussed with the patient.  Risks including but not limited to the risk of anesthesia, blood clots, nerve damage, blood vessel damage, failure of the prosthesis, infection and up to and including death.  Patient understand the risks, benefits and expectations and wishes to proceed with surgery.   D/C Plans:   Home with HHPT  Post-op Meds:    No Rx given  Tranexamic Acid:   To be given  Decadron:    To be given  FYI:    ASA post-op  Norco post-op   Patient Active Problem List   Diagnosis Date Noted  . Insomnia 01/31/2013  . Impaired fasting glucose 08/04/2012  . Hyperlipidemia 08/04/2012  . Acquired spondylolisthesis 07/21/2011    Class: Diagnosis of  . 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 Carpal tunnel syndrome 08/30/2009  . ARTHRITIS, CERVICAL SPINE 05/07/2009  . PARESTHESIA 05/07/2009  .  SPONDYLOLISTHESIS 03/23/2009  . METATARSALGIA 03/15/2009  . CAVUS DEFORMITY OF FOOT, ACQUIRED 03/15/2009  . HAMMER TOE 03/15/2009   Past Medical History  Diagnosis Date  . Post-operative nausea and vomiting   . Cervical cancer 2005  . Impaired fasting glucose   . Carpal tunnel syndrome, bilateral     no problems s/p surgery bilaterally  . Arthritis     spine, right hand, hip  . DDD (degenerative disc disease), cervical     C4-5, C5-6, some oseophytosis, foraminal narrowing C3-4, C4-5    Past Surgical History  Procedure Laterality Date  . Cesarean section    . Tonsillectomy    . Abdominal hysterectomy  05/28/2004    pelvic lymphadenectomy, ovarian transposition, suprapubic cath. placement  . Posterior laminectomy / decompression lumbar spine  07/21/2011    arthrodesis L4-5 (Dr. Ellene Route)  . Carpal tunnel release  11/07/2011    Procedure: CARPAL TUNNEL RELEASE;  Surgeon: Cammie Sickle., MD;  Location: [facility];  Service: Orthopedics;  Laterality: Left;  . Breast surgery  1987    breast augmentation, implants replaced 11/2008  . Carpal tunnel release  04/08/2012    Procedure: CARPAL TUNNEL RELEASE;  Surgeon: Cammie Sickle., MD;  Location: [facility];  Service: Orthopedics;  Laterality: Right;  . Hammer toe surgery Left 06/2012  . Childbirth      x3- 2 NVD, 1 c-section    No prescriptions prior to admission   No Known Allergies  History  Substance Use Topics  . Smoking status: Former  Smoker    Quit date: 07/13/1996  . Smokeless tobacco: Never Used  . Alcohol Use: 0.0 oz/week     Comment: 5 drinks/week (light beer)    Family History  Problem Relation Age of Onset  . Osteoporosis Mother   . Arthritis Mother   . Diabetes Mother   . Hearing loss Mother   . Hypertension Father   . Cancer Father   . Heart disease Father 110    CABG  . Peripheral vascular disease Father     s/p bypasses  . Alcohol abuse Sister     recovered  .  Urolithiasis Sister      Review of Systems  Constitutional: Negative.   HENT: Negative.   Eyes: Negative.   Respiratory: Negative.   Cardiovascular: Negative.   Gastrointestinal: Negative.   Genitourinary: Negative.   Musculoskeletal: Positive for joint pain.  Skin: Negative.   Neurological: Negative.   Endo/Heme/Allergies: Negative.   Psychiatric/Behavioral: The patient has insomnia.     Objective:  Physical Exam  Constitutional: She is oriented to person, place, and time. She appears well-developed and well-nourished.  HENT:  Head: Normocephalic and atraumatic.  Mouth/Throat: Oropharynx is clear and moist.  Eyes: Pupils are equal, round, and reactive to light.  Neck: Neck supple. No JVD present. No tracheal deviation present. No thyromegaly present.  Cardiovascular: Normal rate, regular rhythm, normal heart sounds and intact distal pulses.   Respiratory: Effort normal and breath sounds normal. No stridor. No respiratory distress. She has no wheezes.  GI: Soft. There is no tenderness. There is no guarding.  Musculoskeletal:       Right hip: She exhibits decreased range of motion, decreased strength, tenderness and bony tenderness. She exhibits no swelling, no deformity and no laceration.  Lymphadenopathy:    She has no cervical adenopathy.  Neurological: She is alert and oriented to person, place, and time.  Skin: Skin is warm and dry.  Psychiatric: She has a normal mood and affect.     Labs:  Estimated body mass index is 20.98 kg/(m^2) as calculated from the following:   Height as of 10/17/13: 5\' 7"  (1.702 m).   Weight as of 10/17/13: 60.782 kg (134 lb).   Imaging Review Plain radiographs demonstrate severe degenerative joint disease of the right hip(s). The bone quality appears to be good for age and reported activity level.  Assessment/Plan:  End stage arthritis, right hip(s)  The patient history, physical examination, clinical judgement of the provider and  imaging studies are consistent with end stage degenerative joint disease of the right hip(s) and total hip arthroplasty is deemed medically necessary. The treatment options including medical management, injection therapy, arthroscopy and arthroplasty were discussed at length. The risks and benefits of total hip arthroplasty were presented and reviewed. The risks due to aseptic loosening, infection, stiffness, dislocation/subluxation,  thromboembolic complications and other imponderables were discussed.  The patient acknowledged the explanation, agreed to proceed with the plan and consent was signed. Patient is being admitted for inpatient treatment for surgery, pain control, PT, OT, prophylactic antibiotics, VTE prophylaxis, progressive ambulation and ADL's and discharge planning.The patient is planning to be discharged home with home health services.       [place] Jackeline Gutknecht   PAC  12/11/2013, 8:39 PM

## 2013-12-12 NOTE — Anesthesia Preprocedure Evaluation (Addendum)
Anesthesia Evaluation  Patient identified by MRN, date of birth, ID band Patient awake    Reviewed: Allergy & Precautions, H&P , NPO status , Patient's Chart, lab work & pertinent test results  History of Anesthesia Complications (+) PONV  Airway Mallampati: II TM Distance: >3 FB Neck ROM: full    Dental no notable dental hx. (+) Teeth Intact, Dental Advisory Given   Pulmonary neg pulmonary ROS, former smoker,  breath sounds clear to auscultation  Pulmonary exam normal       Cardiovascular Exercise Tolerance: Good negative cardio ROS  Rhythm:regular Rate:Normal     Neuro/Psych Back surgery. Spinal stenosis and spondylolisthesis. Cervical spine disease. metatarsalgia negative neurological ROS  negative psych ROS   GI/Hepatic negative GI ROS, Neg liver ROS,   Endo/Other  negative endocrine ROS  Renal/GU negative Renal ROS  negative genitourinary   Musculoskeletal   Abdominal   Peds  Hematology negative hematology ROS (+)   Anesthesia Other Findings   Reproductive/Obstetrics negative OB ROS                          Anesthesia Physical Anesthesia Plan  ASA: II  Anesthesia Plan: General   Post-op Pain Management:    Induction: Intravenous  Airway Management Planned: Oral ETT  Additional Equipment:   Intra-op Plan:   Post-operative Plan: Extubation in OR  Informed Consent: I have reviewed the patients History and Physical, chart, labs and discussed the procedure including the risks, benefits and alternatives for the proposed anesthesia with the patient or authorized representative who has indicated his/her understanding and acceptance.   Dental Advisory Given  Plan Discussed with: CRNA and Surgeon  Anesthesia Plan Comments:         Anesthesia Quick Evaluation

## 2013-12-13 ENCOUNTER — Inpatient Hospital Stay (HOSPITAL_COMMUNITY): Payer: BC Managed Care – PPO

## 2013-12-13 ENCOUNTER — Inpatient Hospital Stay (HOSPITAL_COMMUNITY)
Admission: RE | Admit: 2013-12-13 | Discharge: 2013-12-14 | DRG: 470 | Disposition: A | Discharge status: Home Health | Payer: BC Managed Care – PPO | Source: Ambulatory Visit | Attending: Orthopedic Surgery | Admitting: Orthopedic Surgery

## 2013-12-13 ENCOUNTER — Encounter (HOSPITAL_COMMUNITY): Payer: BC Managed Care – PPO | Admitting: Anesthesiology

## 2013-12-13 ENCOUNTER — Inpatient Hospital Stay (HOSPITAL_COMMUNITY): Payer: BC Managed Care – PPO | Admitting: Anesthesiology

## 2013-12-13 ENCOUNTER — Encounter (HOSPITAL_COMMUNITY): Payer: Self-pay | Admitting: *Deleted

## 2013-12-13 ENCOUNTER — Encounter (HOSPITAL_COMMUNITY)
Admission: RE | Disposition: A | Discharge status: Home Health | Payer: Self-pay | Source: Ambulatory Visit | Attending: Orthopedic Surgery

## 2013-12-13 DIAGNOSIS — G4700 Insomnia, unspecified: Secondary | ICD-10-CM | POA: Diagnosis present

## 2013-12-13 DIAGNOSIS — Z8262 Family history of osteoporosis: Secondary | ICD-10-CM

## 2013-12-13 DIAGNOSIS — R112 Nausea with vomiting, unspecified: Secondary | ICD-10-CM | POA: Diagnosis not present

## 2013-12-13 DIAGNOSIS — Z96649 Presence of unspecified artificial hip joint: Secondary | ICD-10-CM

## 2013-12-13 DIAGNOSIS — E785 Hyperlipidemia, unspecified: Secondary | ICD-10-CM | POA: Diagnosis present

## 2013-12-13 DIAGNOSIS — M169 Osteoarthritis of hip, unspecified: Principal | ICD-10-CM | POA: Diagnosis present

## 2013-12-13 DIAGNOSIS — Z01812 Encounter for preprocedural laboratory examination: Secondary | ICD-10-CM

## 2013-12-13 DIAGNOSIS — Z87891 Personal history of nicotine dependence: Secondary | ICD-10-CM

## 2013-12-13 DIAGNOSIS — M1610 Unilateral primary osteoarthritis, unspecified hip: Principal | ICD-10-CM | POA: Diagnosis present

## 2013-12-13 DIAGNOSIS — Z8249 Family history of ischemic heart disease and other diseases of the circulatory system: Secondary | ICD-10-CM

## 2013-12-13 HISTORY — PX: TOTAL HIP ARTHROPLASTY: SHX124

## 2013-12-13 LAB — TYPE AND SCREEN
ABO/RH(D): A POS
Antibody Screen: NEGATIVE

## 2013-12-13 SURGERY — ARTHROPLASTY, HIP, TOTAL, ANTERIOR APPROACH
Anesthesia: General | Site: Hip | Laterality: Right

## 2013-12-13 MED ORDER — DEXAMETHASONE SODIUM PHOSPHATE 10 MG/ML IJ SOLN
10.0000 mg | Freq: Once | INTRAMUSCULAR | Status: AC
  Administered 2013-12-14: 10 mg via INTRAVENOUS
  Filled 2013-12-13: qty 1

## 2013-12-13 MED ORDER — DEXAMETHASONE SODIUM PHOSPHATE 10 MG/ML IJ SOLN
INTRAMUSCULAR | Status: AC
  Filled 2013-12-13: qty 1

## 2013-12-13 MED ORDER — NEOSTIGMINE METHYLSULFATE 1 MG/ML IJ SOLN
INTRAMUSCULAR | Status: DC | PRN
  Administered 2013-12-13: 3 mg via INTRAVENOUS

## 2013-12-13 MED ORDER — ONDANSETRON HCL 4 MG/2ML IJ SOLN
INTRAMUSCULAR | Status: AC
  Filled 2013-12-13: qty 2

## 2013-12-13 MED ORDER — PHENOL 1.4 % MT LIQD: 1.0000 | OROMUCOSAL | Status: DC | PRN

## 2013-12-13 MED ORDER — GLYCOPYRROLATE 0.2 MG/ML IJ SOLN
INTRAMUSCULAR | Status: DC | PRN
  Administered 2013-12-13: 0.4 mg via INTRAVENOUS

## 2013-12-13 MED ORDER — POTASSIUM CHLORIDE 2 MEQ/ML IV SOLN
INTRAVENOUS | Status: DC
  Administered 2013-12-13 (×2): via INTRAVENOUS
  Filled 2013-12-13 (×8): qty 1000

## 2013-12-13 MED ORDER — ONDANSETRON HCL 4 MG/2ML IJ SOLN
INTRAMUSCULAR | Status: DC | PRN
  Administered 2013-12-13 (×2): 2 mg via INTRAVENOUS

## 2013-12-13 MED ORDER — MIDAZOLAM HCL 5 MG/5ML IJ SOLN
INTRAMUSCULAR | Status: DC | PRN
  Administered 2013-12-13: 1 mg via INTRAVENOUS

## 2013-12-13 MED ORDER — HYDROMORPHONE HCL PF 1 MG/ML IJ SOLN
INTRAMUSCULAR | Status: AC
Start: 2013-12-13 — End: 2013-12-13
  Filled 2013-12-13: qty 1

## 2013-12-13 MED ORDER — PROMETHAZINE HCL 25 MG/ML IJ SOLN
6.2500 mg | Freq: Four times a day (QID) | INTRAMUSCULAR | Status: DC | PRN
  Administered 2013-12-13 – 2013-12-14 (×2): 6.25 mg via INTRAVENOUS
  Filled 2013-12-13 (×2): qty 1

## 2013-12-13 MED ORDER — METOCLOPRAMIDE HCL 5 MG/ML IJ SOLN
5.0000 mg | Freq: Four times a day (QID) | INTRAMUSCULAR | Status: DC
  Administered 2013-12-13 – 2013-12-14 (×4): 10 mg via INTRAVENOUS
  Filled 2013-12-13 (×9): qty 2

## 2013-12-13 MED ORDER — FENTANYL CITRATE 0.05 MG/ML IJ SOLN
INTRAMUSCULAR | Status: DC | PRN
  Administered 2013-12-13 (×2): 50 ug via INTRAVENOUS
  Administered 2013-12-13 (×2): 25 ug via INTRAVENOUS
  Administered 2013-12-13 (×2): 50 ug via INTRAVENOUS

## 2013-12-13 MED ORDER — LACTATED RINGERS IV SOLN
INTRAVENOUS | Status: DC | PRN
  Administered 2013-12-13: 07:00:00 via INTRAVENOUS

## 2013-12-13 MED ORDER — ALUM & MAG HYDROXIDE-SIMETH 200-200-20 MG/5ML PO SUSP: 30.0000 mL | ORAL | Status: DC | PRN

## 2013-12-13 MED ORDER — SENNA 8.6 MG PO TABS
1.0000 | ORAL_TABLET | Freq: Two times a day (BID) | ORAL | Status: DC
  Administered 2013-12-14: 8.6 mg via ORAL

## 2013-12-13 MED ORDER — KETAMINE HCL 10 MG/ML IJ SOLN
INTRAMUSCULAR | Status: AC
  Filled 2013-12-13: qty 1

## 2013-12-13 MED ORDER — CEFAZOLIN SODIUM-DEXTROSE 2-3 GM-% IV SOLR
INTRAVENOUS | Status: AC
  Filled 2013-12-13: qty 50

## 2013-12-13 MED ORDER — CEFAZOLIN SODIUM 1-5 GM-% IV SOLN
1.0000 g | Freq: Four times a day (QID) | INTRAVENOUS | Status: AC
  Administered 2013-12-13 (×2): 1 g via INTRAVENOUS
  Filled 2013-12-13 (×2): qty 50

## 2013-12-13 MED ORDER — MIDAZOLAM HCL 2 MG/2ML IJ SOLN
INTRAMUSCULAR | Status: AC
  Filled 2013-12-13: qty 2

## 2013-12-13 MED ORDER — PROPOFOL 10 MG/ML IV BOLUS
INTRAVENOUS | Status: AC
  Filled 2013-12-13: qty 20

## 2013-12-13 MED ORDER — METHOCARBAMOL 100 MG/ML IJ SOLN
500.0000 mg | Freq: Four times a day (QID) | INTRAVENOUS | Status: DC | PRN
  Administered 2013-12-13 – 2013-12-14 (×2): 500 mg via INTRAVENOUS
  Filled 2013-12-13 (×2): qty 5

## 2013-12-13 MED ORDER — KETAMINE HCL 10 MG/ML IJ SOLN
INTRAMUSCULAR | Status: DC | PRN
  Administered 2013-12-13: 25 mg via INTRAVENOUS

## 2013-12-13 MED ORDER — SODIUM CHLORIDE 0.9 % IJ SOLN
INTRAMUSCULAR | Status: AC
  Filled 2013-12-13: qty 10

## 2013-12-13 MED ORDER — HYDROMORPHONE HCL PF 2 MG/ML IJ SOLN
INTRAMUSCULAR | Status: AC
  Filled 2013-12-13: qty 1

## 2013-12-13 MED ORDER — EPHEDRINE SULFATE 50 MG/ML IJ SOLN
INTRAMUSCULAR | Status: DC | PRN
  Administered 2013-12-13 (×3): 5 mg via INTRAVENOUS

## 2013-12-13 MED ORDER — HYDROMORPHONE HCL PF 1 MG/ML IJ SOLN
INTRAMUSCULAR | Status: DC | PRN
  Administered 2013-12-13 (×2): 0.5 mg via INTRAVENOUS

## 2013-12-13 MED ORDER — ONDANSETRON HCL 4 MG PO TABS: 4.0000 mg | ORAL_TABLET | Freq: Four times a day (QID) | ORAL | Status: DC | PRN

## 2013-12-13 MED ORDER — EPHEDRINE SULFATE 50 MG/ML IJ SOLN
INTRAMUSCULAR | Status: AC
  Filled 2013-12-13: qty 1

## 2013-12-13 MED ORDER — LACTATED RINGERS IV SOLN
INTRAVENOUS | Status: DC
  Administered 2013-12-13: 10:00:00 via INTRAVENOUS

## 2013-12-13 MED ORDER — CISATRACURIUM BESYLATE (PF) 10 MG/5ML IV SOLN
INTRAVENOUS | Status: DC | PRN
  Administered 2013-12-13: 5 mg via INTRAVENOUS
  Administered 2013-12-13: 2 mg via INTRAVENOUS

## 2013-12-13 MED ORDER — LIDOCAINE HCL (CARDIAC) 20 MG/ML IV SOLN
INTRAVENOUS | Status: DC | PRN
  Administered 2013-12-13: 30 mg via INTRAVENOUS

## 2013-12-13 MED ORDER — DEXAMETHASONE SODIUM PHOSPHATE 10 MG/ML IJ SOLN: 10.0000 mg | Freq: Once | INTRAMUSCULAR | Status: DC

## 2013-12-13 MED ORDER — GLYCOPYRROLATE 0.2 MG/ML IJ SOLN
INTRAMUSCULAR | Status: AC
  Filled 2013-12-13: qty 2

## 2013-12-13 MED ORDER — DEXAMETHASONE SODIUM PHOSPHATE 4 MG/ML IJ SOLN
INTRAMUSCULAR | Status: DC | PRN
  Administered 2013-12-13: 10 mg via INTRAVENOUS

## 2013-12-13 MED ORDER — TRANEXAMIC ACID 100 MG/ML IV SOLN
1000.0000 mg | Freq: Once | INTRAVENOUS | Status: AC
  Administered 2013-12-13: 1000 mg via INTRAVENOUS
  Filled 2013-12-13: qty 10

## 2013-12-13 MED ORDER — HYDROMORPHONE HCL PF 1 MG/ML IJ SOLN
0.5000 mg | INTRAMUSCULAR | Status: DC | PRN
  Administered 2013-12-13: 0.5 mg via INTRAVENOUS
  Filled 2013-12-13: qty 1

## 2013-12-13 MED ORDER — CHLORHEXIDINE GLUCONATE 4 % EX LIQD: 60.0000 mL | Freq: Once | CUTANEOUS | Status: DC

## 2013-12-13 MED ORDER — ASPIRIN EC 325 MG PO TBEC
325.0000 mg | DELAYED_RELEASE_TABLET | Freq: Two times a day (BID) | ORAL | Status: DC
  Administered 2013-12-13 – 2013-12-14 (×2): 325 mg via ORAL
  Filled 2013-12-13 (×4): qty 1

## 2013-12-13 MED ORDER — FENTANYL CITRATE 0.05 MG/ML IJ SOLN
INTRAMUSCULAR | Status: AC
  Filled 2013-12-13: qty 5

## 2013-12-13 MED ORDER — FERROUS SULFATE 325 (65 FE) MG PO TABS
325.0000 mg | ORAL_TABLET | Freq: Three times a day (TID) | ORAL | Status: DC
  Filled 2013-12-13 (×5): qty 1

## 2013-12-13 MED ORDER — CEFAZOLIN SODIUM-DEXTROSE 2-3 GM-% IV SOLR
2.0000 g | INTRAVENOUS | Status: AC
  Administered 2013-12-13: 2 g via INTRAVENOUS

## 2013-12-13 MED ORDER — HYDROCODONE-ACETAMINOPHEN 7.5-325 MG PO TABS
1.0000 | ORAL_TABLET | ORAL | Status: DC
  Administered 2013-12-13 (×2): 1 via ORAL
  Filled 2013-12-13 (×2): qty 1

## 2013-12-13 MED ORDER — PROPOFOL 10 MG/ML IV BOLUS
INTRAVENOUS | Status: DC | PRN
  Administered 2013-12-13: 150 mg via INTRAVENOUS

## 2013-12-13 MED ORDER — HYDROMORPHONE HCL PF 1 MG/ML IJ SOLN
0.2500 mg | INTRAMUSCULAR | Status: DC | PRN
  Administered 2013-12-13: 0.5 mg via INTRAVENOUS

## 2013-12-13 MED ORDER — MENTHOL 3 MG MT LOZG
1.0000 | LOZENGE | OROMUCOSAL | Status: DC | PRN
  Filled 2013-12-13: qty 9

## 2013-12-13 MED ORDER — POLYETHYLENE GLYCOL 3350 17 G PO PACK
17.0000 g | PACK | Freq: Every day | ORAL | Status: DC | PRN
  Filled 2013-12-13: qty 1

## 2013-12-13 MED ORDER — DOCUSATE SODIUM 100 MG PO CAPS
100.0000 mg | ORAL_CAPSULE | Freq: Two times a day (BID) | ORAL | Status: DC
  Administered 2013-12-14: 100 mg via ORAL
  Filled 2013-12-13 (×3): qty 1

## 2013-12-13 MED ORDER — CISATRACURIUM BESYLATE 20 MG/10ML IV SOLN
INTRAVENOUS | Status: AC
  Filled 2013-12-13: qty 10

## 2013-12-13 MED ORDER — LIDOCAINE HCL (CARDIAC) 20 MG/ML IV SOLN
INTRAVENOUS | Status: AC
  Filled 2013-12-13: qty 10

## 2013-12-13 MED ORDER — ONDANSETRON HCL 4 MG/2ML IJ SOLN
4.0000 mg | Freq: Four times a day (QID) | INTRAMUSCULAR | Status: DC | PRN
  Administered 2013-12-13 – 2013-12-14 (×2): 4 mg via INTRAVENOUS
  Filled 2013-12-13 (×2): qty 2

## 2013-12-13 MED ORDER — STERILE WATER FOR IRRIGATION IR SOLN
Status: DC | PRN
  Administered 2013-12-13: 1500 mL

## 2013-12-13 MED ORDER — DIPHENHYDRAMINE HCL 12.5 MG/5ML PO ELIX: 25.0000 mg | ORAL_SOLUTION | Freq: Four times a day (QID) | ORAL | Status: DC | PRN

## 2013-12-13 MED ORDER — SUCCINYLCHOLINE CHLORIDE 20 MG/ML IJ SOLN
INTRAMUSCULAR | Status: DC | PRN
  Administered 2013-12-13: 100 mg via INTRAVENOUS

## 2013-12-13 MED ORDER — METHOCARBAMOL 500 MG PO TABS
500.0000 mg | ORAL_TABLET | Freq: Four times a day (QID) | ORAL | Status: DC | PRN
  Administered 2013-12-13 – 2013-12-14 (×2): 500 mg via ORAL
  Filled 2013-12-13 (×2): qty 1

## 2013-12-13 MED ORDER — 0.9 % SODIUM CHLORIDE (POUR BTL) OPTIME
TOPICAL | Status: DC | PRN
  Administered 2013-12-13: 1000 mL

## 2013-12-13 SURGICAL SUPPLY — 40 items
ADH SKN CLS APL DERMABOND .7 (GAUZE/BANDAGES/DRESSINGS) ×1
BAG SPEC THK2 15X12 ZIP CLS (MISCELLANEOUS)
BAG ZIPLOCK 12X15 (MISCELLANEOUS) IMPLANT
BLADE SAW SGTL 18X1.27X75 (BLADE) ×2 IMPLANT
CAPT HIP PF COP ×1 IMPLANT
DERMABOND ADVANCED (GAUZE/BANDAGES/DRESSINGS) ×1
DERMABOND ADVANCED .7 DNX12 (GAUZE/BANDAGES/DRESSINGS) ×1 IMPLANT
DRAPE C-ARM 42X120 X-RAY (DRAPES) ×2 IMPLANT
DRAPE STERI IOBAN 125X83 (DRAPES) ×2 IMPLANT
DRAPE U-SHAPE 47X51 STRL (DRAPES) ×6 IMPLANT
DRSG AQUACEL AG ADV 3.5X10 (GAUZE/BANDAGES/DRESSINGS) ×2 IMPLANT
DRSG TEGADERM 4X4.75 (GAUZE/BANDAGES/DRESSINGS) IMPLANT
DURAPREP 26ML APPLICATOR (WOUND CARE) ×3 IMPLANT
ELECT BLADE TIP CTD 4 INCH (ELECTRODE) ×2 IMPLANT
ELECT REM PT RETURN 9FT ADLT (ELECTROSURGICAL) ×2
ELECTRODE REM PT RTRN 9FT ADLT (ELECTROSURGICAL) ×1 IMPLANT
EVACUATOR 1/8 PVC DRAIN (DRAIN) IMPLANT
FACESHIELD WRAPAROUND (MASK) ×10 IMPLANT
FACESHIELD WRAPAROUND OR TEAM (MASK) ×4 IMPLANT
GAUZE SPONGE 2X2 8PLY STRL LF (GAUZE/BANDAGES/DRESSINGS) IMPLANT
GLOVE BIOGEL PI IND STRL 7.5 (GLOVE) ×1 IMPLANT
GLOVE BIOGEL PI IND STRL 8 (GLOVE) ×1 IMPLANT
GLOVE BIOGEL PI INDICATOR 7.5 (GLOVE) ×1
GLOVE BIOGEL PI INDICATOR 8 (GLOVE) ×1
GLOVE ECLIPSE 8.0 STRL XLNG CF (GLOVE) ×2 IMPLANT
GLOVE ORTHO TXT STRL SZ7.5 (GLOVE) ×4 IMPLANT
GOWN SPEC L3 XXLG W/TWL (GOWN DISPOSABLE) ×2 IMPLANT
GOWN STRL REUS W/TWL LRG LVL3 (GOWN DISPOSABLE) ×2 IMPLANT
KIT BASIN OR (CUSTOM PROCEDURE TRAY) ×2 IMPLANT
PACK TOTAL JOINT (CUSTOM PROCEDURE TRAY) ×2 IMPLANT
PADDING CAST COTTON 6X4 STRL (CAST SUPPLIES) ×2 IMPLANT
SPONGE GAUZE 2X2 STER 10/PKG (GAUZE/BANDAGES/DRESSINGS)
SUT MNCRL AB 4-0 PS2 18 (SUTURE) ×2 IMPLANT
SUT VIC AB 1 CT1 36 (SUTURE) ×6 IMPLANT
SUT VIC AB 2-0 CT1 27 (SUTURE) ×4
SUT VIC AB 2-0 CT1 TAPERPNT 27 (SUTURE) ×2 IMPLANT
SUT VLOC 180 0 24IN GS25 (SUTURE) ×2 IMPLANT
TOWEL OR 17X26 10 PK STRL BLUE (TOWEL DISPOSABLE) ×2 IMPLANT
TOWEL OR NON WOVEN STRL DISP B (DISPOSABLE) IMPLANT
TRAY FOLEY CATH 14FRSI W/METER (CATHETERS) ×2 IMPLANT

## 2013-12-13 NOTE — Transfer of Care (Signed)
Immediate Anesthesia Transfer of Care Note  Patient: Tracey Santiago  Procedure(s) Performed: Procedure(s): RIGHT TOTAL HIP ARTHROPLASTY ANTERIOR APPROACH (Right)  Patient Location: PACU  Anesthesia Type:General  Level of Consciousness: awake, oriented, sedated and patient cooperative  Airway & Oxygen Therapy: Patient Spontanous Breathing and Patient connected to face mask oxygen  Post-op Assessment: Report given to PACU RN and Post -op Vital signs reviewed and stable  Post vital signs: stable  Complications: No apparent anesthesia complications

## 2013-12-13 NOTE — Op Note (Signed)
NAME:  Tracey Santiago                ACCOUNT NO.: [id number]      MEDICAL RECORD NO.: [id number]      FACILITY:  [facility], [place]      PHYSICIAN:  Paralee Cancel D  DATE OF BIRTH:  August 16, 1956     DATE OF PROCEDURE:  12/13/2013                                 OPERATIVE REPORT         PREOPERATIVE DIAGNOSIS: Right  hip osteoarthritis.      POSTOPERATIVE DIAGNOSIS:  Right hip osteoarthritis.      PROCEDURE:  Right total hip replacement through an anterior approach   utilizing DePuy THR system, component size 32mm pinnacle cup, a size 32+4 neutral   Altrex liner, a size 7 Hi Tri Lock stem with a 32+1 delta ceramic   ball.      SURGEON:  Pietro Cassis. Alvan Dame, M.D.      ASSISTANT:  Molli Barrows, PA-C     ANESTHESIA:  General.      SPECIMENS:  None.      COMPLICATIONS:  None.      BLOOD LOSS:  350 cc     DRAINS:  None.      INDICATION OF THE PROCEDURE:  Tracey Santiago is a 58 y.o. female who had   presented to office for evaluation of right hip pain.  Radiographs revealed   progressive degenerative changes with bone-on-bone   articulation to the  hip joint.  The patient had painful limited range of   motion significantly affecting their overall quality of life.  The patient was failing to    respond to conservative measures, and at this point was ready   to proceed with more definitive measures.  The patient has noted progressive   degenerative changes in his hip, progressive problems and dysfunction   with regarding the hip prior to surgery.  Consent was obtained for   benefit of pain relief.  Specific risk of infection, DVT, component   failure, dislocation, need for revision surgery, as well discussion of   the anterior versus posterior approach were reviewed.  Consent was   obtained for benefit of anterior pain relief through an anterior   approach.      PROCEDURE IN DETAIL:  The patient was brought to operative theater.   Once adequate anesthesia,  preoperative antibiotics, 2gm Ancef administered.   The patient was positioned supine on the OSI Hanna table.  Once adequate   padding of boney process was carried out, we had predraped out the hip, and  used fluoroscopy to confirm orientation of the pelvis and position.      The right hip was then prepped and draped from proximal iliac crest to   mid thigh with shower curtain technique.      Time-out was performed identifying the patient, planned procedure, and   extremity.     An incision was then made 2 cm distal and lateral to the   anterior superior iliac spine extending over the orientation of the   tensor fascia lata muscle and sharp dissection was carried down to the   fascia of the muscle and protractor placed in the soft tissues.      The fascia was then incised.  The muscle belly was identified and swept  laterally and retractor placed along the superior neck.  Following   cauterization of the circumflex vessels and removing some pericapsular   fat, a second cobra retractor was placed on the inferior neck.  A third   retractor was placed on the anterior acetabulum after elevating the   anterior rectus.  A L-capsulotomy was along the line of the   superior neck to the trochanteric fossa, then extended proximally and   distally.  Tag sutures were placed and the retractors were then placed   intracapsular.  We then identified the trochanteric fossa and   orientation of my neck cut, confirmed this radiographically   and then made a neck osteotomy with the femur on traction.  The femoral   head was removed without difficulty or complication.  Traction was let   off and retractors were placed posterior and anterior around the   acetabulum.      The labrum and foveal tissue were debrided.  I began reaming with a 11mm   reamer and reamed up to 58mm reamer with good bony bed preparation and a 50   cup was chosen.  The final 73mm Pinnacle cup was then impacted under fluoroscopy  to  confirm the depth of penetration and orientation with respect to   abduction.  A screw was placed followed by the hole eliminator.  The final   32+4 neutral Altrex liner was impacted with good visualized rim fit.  The cup was positioned anatomically within the acetabular portion of the pelvis.      At this point, the femur was rolled at 80 degrees.  Further capsule was   released off the inferior aspect of the femoral neck.  I then   released the superior capsule proximally.  The hook was placed laterally   along the femur and elevated manually and held in position with the bed   hook.  The leg was then extended and adducted with the leg rolled to 100   degrees of external rotation.  Once the proximal femur was fully   exposed, I used a box osteotome to set orientation.  I then began   broaching with the starting chili pepper broach and passed this by hand and then broached up to 7.  With the 7 broach in place I chose a high offset neck and did a trial reduction.  The offset was appropriate, leg lengths   appeared to be equal, confirmed radiographically.   Given these findings, I went ahead and dislocated the hip, repositioned all   retractors and positioned the right hip in the extended and abducted position.  The final 7 Hi Tri Lock stem was   chosen and it was impacted down to the level of neck cut.  Based on this   and the trial reduction, a 32+1 delta ceramic ball was chosen and   impacted onto a clean and dry trunnion, and the hip was reduced.  The   hip had been irrigated throughout the case again at this point.  I did   reapproximate the superior capsular leaflet to the anterior leaflet   using #1 Vicryl.  The fascia of the   tensor fascia lata muscle was then reapproximated using #1 Vicryl and #0 V-lock sutures.  The   remaining wound was closed with 2-0 Vicryl and running 4-0 Monocryl.   The hip was cleaned, dried, and dressed sterilely using Dermabond and   Aquacel dressing.  She  was then brought   to recovery room  in stable condition tolerating the procedure well.    Molli Barrows, PA-C was present for the entirety of the case involved from   preoperative positioning, perioperative retractor management, general   facilitation of the case, as well as primary wound closure as assistant.            Pietro Cassis Alvan Dame, M.D.        12/13/2013 8:54 AM

## 2013-12-13 NOTE — Transfer of Care (Signed)
Immediate Anesthesia Transfer of Care Note  Patient: Tracey Santiago  Procedure(s) Performed: Procedure(s): RIGHT TOTAL HIP ARTHROPLASTY ANTERIOR APPROACH (Right)  Patient Location: PACU  Anesthesia Type:General  Level of Consciousness: awake, sedated and patient cooperative  Airway & Oxygen Therapy: Patient Spontanous Breathing and Patient connected to face mask  Post-op Assessment: Report given to PACU RN and Post -op Vital signs reviewed and stable  Post vital signs: stable  Complications: No apparent anesthesia complications

## 2013-12-13 NOTE — Interval H&P Note (Signed)
History and Physical Interval Note:  12/13/2013 6:53 AM  Tracey Santiago  has presented today for surgery, with the diagnosis of OA OF RIGHT HIP  The various methods of treatment have been discussed with the patient and family. After consideration of risks, benefits and other options for treatment, the patient has consented to  Procedure(s): RIGHT TOTAL HIP ARTHROPLASTY ANTERIOR APPROACH (Right) as a surgical intervention .  The patient's history has been reviewed, patient examined, no change in status, stable for surgery.  I have reviewed the patient's chart and labs.  Questions were answered to the patient's satisfaction.     Mauri Pole

## 2013-12-13 NOTE — Evaluation (Signed)
Physical Therapy Evaluation Patient Details Name: Tracey Santiago MRN: [id number] DOB: 11-20-1955 Today's Date: 12/13/2013   History of Present Illness  Pt is a 58 year old female s/p R direct anterior THR.  Clinical Impression  Pt is s/p L direct anterior THA resulting in the deficits listed below (see PT Problem List).  Pt will benefit from skilled PT to increase their independence and safety with mobility to allow discharge to the venue listed below.  Pt limited on evaluation due to nausea however able to stand and take a couple steps before returning to bed.       Follow Up Recommendations Home health PT    Equipment Recommendations  Rolling walker with 5" wheels    Recommendations for Other Services       Precautions / Restrictions Precautions Precautions: None Restrictions Weight Bearing Restrictions: No Other Position/Activity Restrictions: WBAT      Mobility  Bed Mobility Overal bed mobility: Needs Assistance Bed Mobility: Supine to Sit     Supine to sit: Min assist     General bed mobility comments: verbal cues for technique, support for R LE  Transfers Overall transfer level: Needs assistance Equipment used: Rolling walker (2 wheeled) Transfers: Sit to/from Stand Sit to Stand: Min guard         General transfer comment: verbal cues for safe technique, increased time to ascend, pt felt nauseated however took 2 steps forward then felt she needed to sit so stepped backwards to bed  Ambulation/Gait                Stairs            Wheelchair Mobility    Modified Rankin (Stroke Patients Only)       Balance                                     Pertinent Vitals/Pain Pt has more c/o nausea then pain, ice pack applied, RN notified of nausea, activity as tolerated    Home Living Family/patient expects to be discharged to:: Private residence Living Arrangements: Spouse/significant other   Type of Home: House Home  Access: Stairs to enter Entrance Stairs-Rails: None Technical brewer of Steps: 2 Home Layout: One level Home Equipment: None      Prior Function Level of Independence: Independent               Hand Dominance        Extremity/Trunk Assessment               Lower Extremity Assessment: RLE deficits/detail RLE Deficits / Details: decreased functional hip strength observed with mobility       Communication   Communication: No difficulties  Cognition Arousal/Alertness: Lethargic;Suspect due to medications (groggy) Behavior During Therapy: WFL for tasks assessed/performed Overall Cognitive Status: Within Functional Limits for tasks assessed                      General Comments      Exercises        Assessment/Plan    PT Assessment Patient needs continued PT services  PT Diagnosis Difficulty walking;Acute pain   PT Problem List Decreased strength;Decreased activity tolerance;Decreased mobility;Decreased knowledge of use of DME;Pain  PT Treatment Interventions Functional mobility training;Gait training;DME instruction;Stair training;Patient/family education;Therapeutic exercise;Therapeutic activities   PT Goals (Current goals can be found in the Care Plan section) Acute Rehab  PT Goals PT Goal Formulation: With patient Time For Goal Achievement: 12/17/13 Potential to Achieve Goals: Good    Frequency 7X/week   Barriers to discharge        End of Session   Activity Tolerance: Other (comment) (limited by nausea) Patient left: with call bell/phone within reach;in bed;with family/visitor present;Other (comment) (sittin EOB with basin due to nausea, spouse in room , RN aware)         Time: 0370-4888 PT Time Calculation (min): 25 min   Charges:   PT Evaluation $Initial PT Evaluation Tier I: 1 Procedure PT Treatments $Therapeutic Activity: 8-22 mins   PT G Codes:          Tracey Santiago,Tracey Santiago 12/13/2013, 3:00 PM Carmelia Bake, PT,  DPT 12/13/2013 Pager: [phone]

## 2013-12-13 NOTE — Anesthesia Postprocedure Evaluation (Signed)
  Anesthesia Post-op Note  Patient: Tracey Santiago  Procedure(s) Performed: Procedure(s) (LRB): RIGHT TOTAL HIP ARTHROPLASTY ANTERIOR APPROACH (Right)  Patient Location: PACU  Anesthesia Type: General  Level of Consciousness: awake and alert   Airway and Oxygen Therapy: Patient Spontanous Breathing  Post-op Pain: mild  Post-op Assessment: Post-op Vital signs reviewed, Patient's Cardiovascular Status Stable, Respiratory Function Stable, Patent Airway and No signs of Nausea or vomiting  Last Vitals:  Filed Vitals:   12/13/13 1000  BP: 136/65  Pulse: 64  Temp:   Resp: 16    Post-op Vital Signs: stable   Complications: No apparent anesthesia complications

## 2013-12-14 LAB — BASIC METABOLIC PANEL
BUN: 11 mg/dL (ref 6–23)
CO2: 23 mEq/L (ref 19–32)
Calcium: 9 mg/dL (ref 8.4–10.5)
Chloride: 98 mEq/L (ref 96–112)
Creatinine, Ser: 0.52 mg/dL (ref 0.50–1.10)
GFR calc Af Amer: >90 mL/min (ref >90)
GFR calc non Af Amer: >90 mL/min (ref >90)
Glucose, Bld: 171 mg/dL — ABNORMAL HIGH (ref 70–99)
Potassium: 3.6 mEq/L — ABNORMAL LOW (ref 3.7–5.3)
Sodium: 136 mEq/L — ABNORMAL LOW (ref 137–147)

## 2013-12-14 LAB — CBC
HCT: 32.8 % — ABNORMAL LOW (ref 36.0–46.0)
Hemoglobin: 11 g/dL — ABNORMAL LOW (ref 12.0–15.0)
MCH: 31.1 pg (ref 26.0–34.0)
MCHC: 33.5 g/dL (ref 30.0–36.0)
MCV: 92.7 fL (ref 78.0–100.0)
Platelets: 198 10*3/uL (ref 150–400)
RBC: 3.54 MIL/uL — ABNORMAL LOW (ref 3.87–5.11)
RDW: 12.2 % (ref 11.5–15.5)
WBC: 9.5 10*3/uL (ref 4.0–10.5)

## 2013-12-14 MED ORDER — FERROUS SULFATE 325 (65 FE) MG PO TABS: 325.0000 mg | ORAL_TABLET | Freq: Three times a day (TID) | ORAL | Status: DC

## 2013-12-14 MED ORDER — ASPIRIN 325 MG PO TBEC: 325.0000 mg | DELAYED_RELEASE_TABLET | Freq: Two times a day (BID) | ORAL | Status: AC

## 2013-12-14 MED ORDER — TRAMADOL HCL 50 MG PO TABS
50.0000 mg | ORAL_TABLET | Freq: Four times a day (QID) | ORAL | Status: DC | PRN
  Administered 2013-12-14 (×2): 100 mg via ORAL
  Filled 2013-12-14 (×2): qty 2

## 2013-12-14 MED ORDER — POLYETHYLENE GLYCOL 3350 17 G PO PACK: 17.0000 g | PACK | Freq: Two times a day (BID) | ORAL | Status: DC

## 2013-12-14 MED ORDER — METHOCARBAMOL 500 MG PO TABS: 500.0000 mg | ORAL_TABLET | Freq: Four times a day (QID) | ORAL | Status: DC | PRN

## 2013-12-14 MED ORDER — DSS 100 MG PO CAPS: 100.0000 mg | ORAL_CAPSULE | Freq: Two times a day (BID) | ORAL | Status: DC

## 2013-12-14 MED ORDER — PROMETHAZINE HCL 12.5 MG PO TABS: 12.5000 mg | ORAL_TABLET | Freq: Four times a day (QID) | ORAL | Status: DC | PRN

## 2013-12-14 MED ORDER — TRAMADOL HCL 50 MG PO TABS: 50.0000 mg | ORAL_TABLET | Freq: Four times a day (QID) | ORAL | Status: DC | PRN

## 2013-12-14 MED ORDER — ONDANSETRON HCL 4 MG PO TABS: 4.0000 mg | ORAL_TABLET | Freq: Three times a day (TID) | ORAL | Status: DC | PRN

## 2013-12-14 NOTE — Evaluation (Signed)
Occupational Therapy Evaluation Patient Details Name: Tracey Santiago MRN: [id number] DOB: 07/14/1956 Today's Date: 12/29/13    History of Present Illness Pt is a 58 year old female s/p R direct anterior THR.   Clinical Impression   Pt presents to OT s/p THA. All education complete   Follow Up Recommendations  No OT follow up    Equipment Recommendations  3 in 1 bedside comode       Precautions / Restrictions Precautions Precautions: None Restrictions Weight Bearing Restrictions: No      Mobility Bed Mobility Overal bed mobility: Needs Assistance Bed Mobility: Supine to Sit     Supine to sit: Min assist     General bed mobility comments: verbal cues for technique, support for R LE  Transfers Overall transfer level: Needs assistance Equipment used: Rolling walker (2 wheeled) Transfers: Sit to/from Stand Sit to Stand: Min guard         General transfer comment: verbal cues for safe technique; increased time to ascend    Balance                                    ADL             Toilet Transfer: Min guard;RW;Ambulation       General ADL Comments: Pt overall S- min A with ADL activity. Educated on tips to increase I with ADL activity. Pt will have help and did decide to keep 3 n 1     Vision                            Extremity/Trunk Assessment Upper Extremity Assessment Upper Extremity Assessment: Overall WFL for tasks assessed           Communication Communication Communication: No difficulties   Cognition Arousal/Alertness: Awake/alert Behavior During Therapy: WFL for tasks assessed/performed Overall Cognitive Status: Within Functional Limits for tasks assessed                             Home Living Family/patient expects to be discharged to:: Private residence Living Arrangements: Spouse/significant other   Type of Home: House Home Access: Stairs to enter Technical brewer of Steps:  2 Entrance Stairs-Rails: None Home Layout: One level     Bathroom Shower/Tub: Occupational psychologist: Standard     Home Equipment: None;Bedside commode          Prior Functioning/Environment Level of Independence: Independent                                    End of Session:    Activity Tolerance: Patient tolerated treatment well Patient left: in chair;with call bell/phone within reach   Time: 1227-1251 OT Time Calculation (min): 24 min Charges:  OT General Charges $OT Visit: 1 Procedure OT Evaluation $Initial OT Evaluation Tier I: 1 Procedure OT Treatments $Self Care/Home Management : 8-22 mins G-Codes:    Payton Mccallum D 12/29/2013, 1:13 PM

## 2013-12-14 NOTE — Progress Notes (Signed)
I have reviewed this note and agree with all findings. Kati Jennika Ringgold, PT, DPT Pager: [phone]   

## 2013-12-14 NOTE — Progress Notes (Signed)
   CARE MANAGEMENT NOTE 12/14/2013  Patient:  Tracey Santiago, Tracey Santiago   Account Number:  [id number]  Date Initiated:  12/14/2013  Documentation initiated by:  [facility]  Subjective/Objective Assessment:   RIGHT TOTAL HIP ARTHROPLASTY ANTERIOR APPROACH     Action/Plan:   [name]   Anticipated DC Date:  12/14/2013   Anticipated DC Plan:  [name]  CM consult      [facility] Choice  HOME HEALTH   Choice offered to / List presented to:  [name]   DME arranged  3-N-1  [name]      DME agency  [name] arranged  HH-2 PT      [name]   Status of service:  Completed, signed off Medicare Important Message given?   (If response is "NO", the following Medicare IM given date fields will be blank) Date Medicare IM given:   Date Additional Medicare IM given:    Discharge Disposition:  [name]  Per UR Regulation:    If discussed at Long Length of Stay Meetings, dates discussed:    Comments:  12/14/2013 1300 NCM spoke to pt and offered choice for [name]. Pt requested [name] for [facility]. Requested RW and 3n1 for home. [name] notified for DME. [name] aware of dc home today. [name] RN CCM Case Mgmt phone [phone]

## 2013-12-14 NOTE — Progress Notes (Signed)
Physical Therapy Treatment   12/14/13 1525  PT Visit Information  Last PT Received On 12/14/13  Assistance Needed +1  History of Present Illness Pt is a 58 year old female s/p R direct anterior THR.  PT Time Calculation  PT Start Time 1337  PT Stop Time 1352  PT Time Calculation (min) 15 min  Subjective Data  Subjective Pt ambulated in hallway with increase activity tolerance.  Pt reports she is feeling much better and more ready to return home.  All education has been completed, pt and spouse have no further questions.  Precautions  Precautions None  Restrictions  Weight Bearing Restrictions No  Other Position/Activity Restrictions WBAT  Cognition  Arousal/Alertness Awake/alert  Behavior During Therapy WFL for tasks assessed/performed  Overall Cognitive Status Within Functional Limits for tasks assessed  Transfers  Overall transfer level Needs assistance  Equipment used Rolling walker (2 wheeled)  Transfers Sit to/from Stand  Sit to Stand Supervision  General transfer comment verbal cues for safe technique  Ambulation/Gait  Ambulation/Gait assistance Supervision  Ambulation Distance (Feet) 200 Feet  Assistive device Rolling walker (2 wheeled)  Gait Pattern/deviations Step-through pattern;Antalgic  General Gait Details verbal cues for safe technique; verbal cues for heel-to-toe pattern  PT - End of Session  Activity Tolerance Patient tolerated treatment well  Patient left in chair;with call bell/phone within reach;with family/visitor present;with nursing/sitter in room  PT - Assessment/Plan  PT Plan Current plan remains appropriate  PT Frequency 7X/week  Follow Up Recommendations Home health PT  PT equipment Rolling walker with 5" wheels  PT Goal Progression  Progress towards PT goals Progressing toward goals  Acute Rehab PT Goals  PT Goal Formulation With patient  Time For Goal Achievement 12/17/13  Potential to Achieve Goals Good    Jacqulyn Cane SPT 12/14/2013

## 2013-12-14 NOTE — Progress Notes (Signed)
Patient ID: Tracey Santiago, female   DOB: Sep 17, 1955, 58 y.o.   MRN: [id number] Subjective: 1 Day Post-Op Procedure(s) (LRB): RIGHT TOTAL HIP ARTHROPLASTY ANTERIOR APPROACH (Right)    Patient reports pain as mild.  Major issue has been with nausea/vomiting post-operatively.  Minimal narcotics post-op due to nausea.  Sore throat  Objective:   VITALS:   Filed Vitals:   12/14/13 0619  BP: 142/74  Pulse: 80  Temp: 98.9 F (37.2 C)  Resp: 18    Neurovascular intact Incision: dressing C/D/I  LABS  Recent Labs  12/14/13 0400  HGB 11.0*  HCT 32.8*  WBC 9.5  PLT 198     Recent Labs  12/14/13 0400  NA 136*  K 3.6*  BUN 11  CREATININE 0.52  GLUCOSE 171*    No results found for this basename: LABPT, INR,  in the last 72 hours   Assessment/Plan: 1 Day Post-Op Procedure(s) (LRB): RIGHT TOTAL HIP ARTHROPLASTY ANTERIOR APPROACH (Right)   Advance diet Up with therapy Discharge home with home health  Due to experience thus far she would like to try and get home today If she goes we will make certain she has ample medication to combat nausea issues Tramadol for pain

## 2013-12-14 NOTE — Progress Notes (Signed)
I have reviewed this note and agree with all findings. Kati Shakala Marlatt, PT, DPT Pager: [phone]   

## 2013-12-14 NOTE — Progress Notes (Signed)
[facility]  [facility] is providing the following services: RW and Commode  If patient discharges after hours, please call [phone].   [name] 12/14/2013, 10:28 AM

## 2013-12-14 NOTE — Progress Notes (Signed)
Physical Therapy Treatment Patient Details Name: Tracey Santiago MRN: [id number] DOB: [birth year] Today's Date: 12/14/2013    History of Present Illness Pt is a 58 year old female s/p R direct anterior THR.    PT Comments    Pt ambulated in hallway, distance self-limited due to fatigue and nausea, likely related to medications.  Pt showing good progress in mobility despite nausea and fatigue.  Pt practiced safe stair technique for 2 stairs using one crutch.  Pt performed exercises to tolerance.    Follow Up Recommendations  Home health PT     Equipment Recommendations  Rolling walker with 5" wheels    Recommendations for Other Services       Precautions / Restrictions Precautions Precautions: None Restrictions Weight Bearing Restrictions: No    Mobility  Bed Mobility Overal bed mobility: Needs Assistance Bed Mobility: Supine to Sit     Supine to sit: Min assist     General bed mobility comments: verbal cues for technique, support for R LE  Transfers Overall transfer level: Needs assistance Equipment used: Rolling walker (2 wheeled) Transfers: Sit to/from Stand Sit to Stand: Min guard         General transfer comment: verbal cues for safe technique; increased time to ascend  Ambulation/Gait Ambulation/Gait assistance: Min guard Ambulation Distance (Feet): 80 Feet Assistive device: Rolling walker (2 wheeled) Gait Pattern/deviations: Antalgic;Step-to pattern Gait velocity: decr   General Gait Details: verbal cues for safe technique; pt initially reported head feeling 'woozy' which improved with increased time ambulating   Stairs Stairs: Yes Stairs assistance: Min guard Stair Management: One rail Right;Step to pattern;Forwards;With crutches Number of Stairs: 3 General stair comments: verbal cues and guarding for safety; pt use of crutches for least restrictive mobility  Wheelchair Mobility    Modified Rankin (Stroke Patients Only)       Balance                                    Cognition Arousal/Alertness: Awake/alert Behavior During Therapy: WFL for tasks assessed/performed Overall Cognitive Status: Within Functional Limits for tasks assessed                      Exercises Total Joint Exercises Ankle Circles/Pumps: AROM;15 reps;Both Quad Sets: AROM;Right;15 reps Towel Squeeze: AROM;Both;15 reps Short Arc Quad: AROM;Right;15 reps Heel Slides: AAROM;Right;15 reps Hip ABduction/ADduction: AROM;Right;15 reps Straight Leg Raises: AAROM;Right;10 reps    General Comments        Pertinent Vitals/Pain Activity to tolerance.  Ice reapplied at end of treatment.    Home Living Family/patient expects to be discharged to:: Private residence Living Arrangements: Spouse/significant other   Type of Home: House Home Access: Stairs to enter Entrance Stairs-Rails: None Home Layout: One level Home Equipment: None;Bedside commode      Prior Function Level of Independence: Independent          PT Goals (current goals can now be found in the care plan section) Acute Rehab PT Goals PT Goal Formulation: With patient Time For Goal Achievement: 12/17/13 Potential to Achieve Goals: Good Progress towards PT goals: Progressing toward goals    Frequency  7X/week    PT Plan Current plan remains appropriate    End of Session Equipment Utilized During Treatment: Gait belt Activity Tolerance: Patient tolerated treatment well Patient left: in chair;with call bell/phone within reach     Time: 1008-1040 PT Time Calculation (  min): 32 min  Charges:  $Gait Training: 8-22 mins $Therapeutic Exercise: 8-22 mins                    G Codes:      Jacqulyn Cane 29-Dec-2013, 1:20 PM Jacqulyn Cane SPT 12/29/13

## 2013-12-15 NOTE — Discharge Summary (Signed)
Physician Discharge Summary  Patient ID: PARA COSSEY MRN: [id number] DOB/AGE: [birth year] 60 y.o.  Admit date: 12/13/2013 Discharge date: 12/14/2013   Procedures:  Procedure(s) (LRB): RIGHT TOTAL HIP ARTHROPLASTY ANTERIOR APPROACH (Right)  Attending Physician:  Dr. Paralee Cancel   Admission Diagnoses:   Right hip OA / pain  Discharge Diagnoses:  Principal Problem:   S/P right THA, AA  Past Medical History  Diagnosis Date  . Post-operative nausea and vomiting   . Cervical cancer 2005  . Impaired fasting glucose   . Carpal tunnel syndrome, bilateral     no problems s/p surgery bilaterally  . Arthritis     spine, right hand, hip  . DDD (degenerative disc disease), cervical     C4-5, C5-6, some oseophytosis, foraminal narrowing C3-4, C4-5    HPI: Tracey Santiago, 58 y.o. female, has a history of pain and functional disability in the right hip(s) due to arthritis and patient has failed non-surgical conservative treatments for greater than 12 weeks to include NSAID's and/or analgesics and activity modification. Onset of symptoms was gradual starting 2+ years ago with gradually worsening course since that time.The patient noted no past surgery on the right hip(s). Patient currently rates pain in the right hip at 9 out of 10 with activity. Patient has worsening of pain with activity and weight bearing, trendelenberg gait, pain that interfers with activities of daily living and pain with passive range of motion. Patient has evidence of periarticular osteophytes and joint space narrowing by imaging studies. This condition presents safety issues increasing the risk of falls. There is no current active infection. Risks, benefits and expectations were discussed with the patient. Risks including but not limited to the risk of anesthesia, blood clots, nerve damage, blood vessel damage, failure of the prosthesis, infection and up to and including death. Patient understand the risks, benefits and  expectations and wishes to proceed with surgery.   PCP: Vikki Ports, MD   Discharged Condition: good  Hospital Course:  Patient underwent the above stated procedure on 12/13/2013. Patient tolerated the procedure well and brought to the recovery room in good condition and subsequently to the floor.  POD #1 BP: 142/74 ; Pulse: 80 ; Temp: 98.9 F (37.2 C) ; Resp: 18  Patient reports pain as mild. Major issue has been with nausea/vomiting post-operatively. Minimal narcotics post-op due to nausea. Sore throat Neurovascular intact, dorsiflexion/plantar flexion intact, incision: dressing C/D/I, no cellulitis present and compartment soft.   LABS  Basename    HGB  11.0  HCT  32.8    Discharge Exam: General appearance: alert, cooperative and no distress Extremities: Homans sign is negative, no sign of DVT, no edema, redness or tenderness in the calves or thighs and no ulcers, gangrene or trophic changes  Disposition:     Home-Health Care Svc with follow up in 2 weeks   Follow-up Information   Follow up with Mauri Pole, MD. Schedule an appointment as soon as possible for a visit in 2 weeks.   Specialty:  Orthopedic Surgery   Contact information:   [address] [place] [address] [phone]       Follow up with [facility]. [facility])    Contact information:   [address] [place] [address] [phone]       Discharge Orders   Future Appointments Provider Department Dept Phone   02/01/2014 8:45 AM Rita Ohara, MD [place] [phone]   Future Orders  Complete By Expires   Call MD / Call 911  As directed    Comments:     If you experience chest pain or shortness of breath, CALL 911 and be transported to the hospital emergency room.  If you develope a fever above 101 F, pus (white drainage) or increased drainage or redness at the wound, or calf pain, call your surgeon's office.   Change dressing   As directed    Comments:     Maintain surgical dressing for 10-14 days, or until follow up in the clinic.   Constipation Prevention  As directed    Comments:     Drink plenty of fluids.  Prune juice may be helpful.  You may use a stool softener, such as Colace (over the counter) 100 mg twice a day.  Use MiraLax (over the counter) for constipation as needed.   Diet - low sodium heart healthy  As directed    Discharge instructions  As directed    Comments:     Maintain surgical dressing for 10-14 days, or until follow up in the clinic. Follow up in 2 weeks at [facility]. Call with any questions or concerns.   Increase activity slowly as tolerated  As directed    TED hose  As directed    Comments:     Use stockings (TED hose) for 2 weeks on both leg(s).  You may remove them at night for sleeping.   Weight bearing as tolerated  As directed    Questions:     Laterality:     Extremity:          Medication List    STOP taking these medications       polyethylene glycol powder powder  Commonly known as:  GLYCOLAX/MIRALAX  Replaced by:  polyethylene glycol packet      TAKE these medications       aspirin 325 MG EC tablet  Take 1 tablet (325 mg total) by mouth 2 (two) times daily.     calcium carbonate 500 MG chewable tablet  Commonly known as:  TUMS - dosed in mg elemental calcium  Chew 2 tablets by mouth daily.     DSS 100 MG Caps  Take 100 mg by mouth 2 (two) times daily.     ferrous sulfate 325 (65 FE) MG tablet  Take 1 tablet (325 mg total) by mouth 3 (three) times daily after meals.     MAGNESIUM PO  Take 2 tablets by mouth daily.     methocarbamol 500 MG tablet  Commonly known as:  ROBAXIN  Take 1 tablet (500 mg total) by mouth every 6 (six) hours as needed for muscle spasms.     Omega 3 1200 MG Caps  Take 2,400 mg by mouth daily.     ondansetron 4 MG tablet  Commonly known as:  ZOFRAN  Take 1 tablet (4 mg total) by mouth every 8 (eight) hours as  needed for nausea or vomiting.     polyethylene glycol packet  Commonly known as:  MIRALAX / GLYCOLAX  Take 17 g by mouth 2 (two) times daily.     promethazine 12.5 MG tablet  Commonly known as:  PHENERGAN  Take 1 tablet (12.5 mg total) by mouth every 6 (six) hours as needed for nausea or vomiting.     traMADol 50 MG tablet  Commonly known as:  ULTRAM  Take 1-2 tablets (50-100 mg total) by mouth every 6 (six) hours as needed for  moderate pain.     vitamin C 500 MG tablet  Commonly known as:  ASCORBIC ACID  Take 500 mg by mouth daily.     vitamin D (CHOLECALCIFEROL) 400 UNITS tablet  Take 1,200 Units by mouth daily.     vitamin E 400 UNIT capsule  Take 400 Units by mouth daily.         Signed: [place]. Khyler Urda   PAC  12/15/2013, 9:35 AM

## 2014-02-01 ENCOUNTER — Ambulatory Visit (INDEPENDENT_AMBULATORY_CARE_PROVIDER_SITE_OTHER): Payer: BC Managed Care – PPO | Admitting: Family Medicine

## 2014-02-01 ENCOUNTER — Encounter: Payer: Self-pay | Admitting: Family Medicine

## 2014-02-01 ENCOUNTER — Other Ambulatory Visit (HOSPITAL_COMMUNITY)
Admission: RE | Admit: 2014-02-01 | Discharge: 2014-02-01 | Disposition: A | Discharge status: Home / Self Care | Payer: BC Managed Care – PPO | Source: Ambulatory Visit | Attending: Family Medicine | Admitting: Family Medicine

## 2014-02-01 VITALS — BP 110/70 | HR 68 | Ht 67.0 in | Wt 132.0 lb

## 2014-02-01 DIAGNOSIS — R7301 Impaired fasting glucose: Secondary | ICD-10-CM

## 2014-02-01 DIAGNOSIS — G4700 Insomnia, unspecified: Secondary | ICD-10-CM

## 2014-02-01 DIAGNOSIS — Z1151 Encounter for screening for human papillomavirus (HPV): Secondary | ICD-10-CM | POA: Insufficient documentation

## 2014-02-01 DIAGNOSIS — E785 Hyperlipidemia, unspecified: Secondary | ICD-10-CM

## 2014-02-01 DIAGNOSIS — Z79899 Other long term (current) drug therapy: Secondary | ICD-10-CM

## 2014-02-01 DIAGNOSIS — Z124 Encounter for screening for malignant neoplasm of cervix: Secondary | ICD-10-CM | POA: Insufficient documentation

## 2014-02-01 DIAGNOSIS — Z0000 Encounter for general adult medical examination without abnormal findings: Secondary | ICD-10-CM

## 2014-02-01 LAB — COMPREHENSIVE METABOLIC PANEL
ALT: 16 U/L (ref 0–35)
AST: 18 U/L (ref 0–37)
Albumin: 4.8 g/dL (ref 3.5–5.2)
Alkaline Phosphatase: 83 U/L (ref 39–117)
BUN: 21 mg/dL (ref 6–23)
CO2: 26 mEq/L (ref 19–32)
Calcium: 9.9 mg/dL (ref 8.4–10.5)
Chloride: 101 mEq/L (ref 96–112)
Creat: 0.69 mg/dL (ref 0.50–1.10)
Glucose, Bld: 119 mg/dL — ABNORMAL HIGH (ref 70–99)
Potassium: 4.4 mEq/L (ref 3.5–5.3)
Sodium: 139 mEq/L (ref 135–145)
Total Bilirubin: 0.7 mg/dL (ref 0.2–1.2)
Total Protein: 7.1 g/dL (ref 6.0–8.3)

## 2014-02-01 LAB — LIPID PANEL
Cholesterol: 257 mg/dL — ABNORMAL HIGH (ref 0–200)
HDL: 121 mg/dL (ref >39)
LDL Cholesterol: 126 mg/dL — ABNORMAL HIGH (ref 0–99)
Total CHOL/HDL Ratio: 2.1 Ratio
Triglycerides: 51 mg/dL (ref <150)
VLDL: 10 mg/dL (ref 0–40)

## 2014-02-01 LAB — POCT URINALYSIS DIPSTICK
Bilirubin, UA: NEGATIVE
Blood, UA: NEGATIVE
Glucose, UA: NEGATIVE
Ketones, UA: NEGATIVE
Leukocytes, UA: NEGATIVE
Nitrite, UA: NEGATIVE
Protein, UA: NEGATIVE
Spec Grav, UA: <=1.005
Urobilinogen, UA: NEGATIVE
pH, UA: 7

## 2014-02-01 LAB — CBC WITH DIFFERENTIAL/PLATELET
Basophils Absolute: 0.1 10*3/uL (ref 0.0–0.1)
Basophils Relative: 1 % (ref 0–1)
Eosinophils Absolute: 0.1 10*3/uL (ref 0.0–0.7)
Eosinophils Relative: 1 % (ref 0–5)
HCT: 40.6 % (ref 36.0–46.0)
Hemoglobin: 13.6 g/dL (ref 12.0–15.0)
Lymphocytes Relative: 23 % (ref 12–46)
Lymphs Abs: 1.7 10*3/uL (ref 0.7–4.0)
MCH: 31.1 pg (ref 26.0–34.0)
MCHC: 33.5 g/dL (ref 30.0–36.0)
MCV: 92.9 fL (ref 78.0–100.0)
Monocytes Absolute: 0.6 10*3/uL (ref 0.1–1.0)
Monocytes Relative: 8 % (ref 3–12)
Neutro Abs: 5 10*3/uL (ref 1.7–7.7)
Neutrophils Relative %: 67 % (ref 43–77)
Platelets: 272 10*3/uL (ref 150–400)
RBC: 4.37 MIL/uL (ref 3.87–5.11)
RDW: 12.7 % (ref 11.5–15.5)
WBC: 7.4 10*3/uL (ref 4.0–10.5)

## 2014-02-01 LAB — MAGNESIUM: Magnesium: 2 mg/dL (ref 1.5–2.5)

## 2014-02-01 LAB — TSH: TSH: 1.097 u[IU]/mL (ref 0.350–4.500)

## 2014-02-01 MED ORDER — ZOLPIDEM TARTRATE 10 MG PO TABS: 5.0000 mg | ORAL_TABLET | Freq: Every evening | ORAL | Status: DC | PRN

## 2014-02-01 NOTE — Progress Notes (Signed)
Chief Complaint  Patient presents with  . Annual Exam    fasting annual exam with pap. Did not do eye exam as she just had one. Does complain about nausea since her hip surgery at the end of March. And also has a huge sleep anxiety.    Tracey Santiago is a 58 y.o. female who presents for a complete physical.  She has the following concerns:  Anxiety--related to insomnia, fear of not being able to fall asleep. Has used xanax prn over the last year (when she was changed from [place] to xanax).  At that time she had significant stressors (mother's health, putting house on market). She no longer has stressors, she only stresses about the inability to fall asleep.  She takes melatonin nightly.  She has taken Advil PM, which has sometimes helped.  She feels that having something at home to use just in case would help decrease the anxiety.  She doesn't feel like she would need to take the medication often, but having it will ease the anxiety.  She previously has taken both xanax and ambien to help with sleep.  Found the ambien to be more effective and no side effects or unusual behaviors.  Only needed to take 1/2 tablet with good results.  She was last rx'd alprazolam 1 year ago, just #30, never needed refills.  She has been having nausea since her surgery.  It is mild, not to the point of needing to take zofran.  She has used a special tea which has been very helpful.  She tends to have nausea in the mornings, and when she is hungry.  She denies heartburn. Getting outside (yardwork, walking the dog) always helps.  Symptoms have been gradually improving.  Her mother recently broke her hip.  She has osteoporosis.  They treated with nonweightbearing (no surgery).  Immunization History  Administered Date(s) Administered  . Influenza Split 07/29/2012, 08/15/2013  . Tdap 09/15/2008   Last Pap smear: 01/2013 of vaginal cuff Last mammogram: 09/2013 Last colonoscopy: 11/07 --believes it was normal. She recalls  getting a letter stating they recommend doing repeat at 5 years, but unclear why.  Last DEXA: never  Dentist: twice yearly  Ophtho: yearly, recently went  Exercise:   S/p recent THR. She completed PT.  She is active in the yard, walking the dogs.  Upper body strength exercises.  Past Medical History  Diagnosis Date  . Post-operative nausea and vomiting   . Cervical cancer 2005  . Impaired fasting glucose   . Carpal tunnel syndrome, bilateral     no problems s/p surgery bilaterally  . Arthritis     spine, right hand, hip  . DDD (degenerative disc disease), cervical     C4-5, C5-6, some oseophytosis, foraminal narrowing C3-4, C4-5   Past Surgical History  Procedure Laterality Date  . Cesarean section    . Tonsillectomy    . Abdominal hysterectomy  05/28/2004    pelvic lymphadenectomy, ovarian transposition, suprapubic cath. placement  . Posterior laminectomy / decompression lumbar spine  07/21/2011    arthrodesis L4-5 (Dr. Ellene Route)  . Carpal tunnel release  11/07/2011    Procedure: CARPAL TUNNEL RELEASE;  Surgeon: Cammie Sickle., MD;  Location: [facility];  Service: Orthopedics;  Laterality: Left;  . Breast surgery  1987    breast augmentation, implants replaced 11/2008  . Carpal tunnel release  04/08/2012    Procedure: CARPAL TUNNEL RELEASE;  Surgeon: Cammie Sickle., MD;  Location:  [facility];  Service: Orthopedics;  Laterality: Right;  . Hammer toe surgery Left 06/2012  . Childbirth      x3- 2 NVD, 1 c-section  . Total hip arthroplasty Right 12/13/2013    Procedure: RIGHT TOTAL HIP ARTHROPLASTY ANTERIOR APPROACH;  Surgeon: Mauri Pole, MD;  Location: [facility];  Service: Orthopedics;  Laterality: Right;   History   Social History  . Marital Status: Married    Spouse Name: N/A    Number of Children: 3  . Years of Education: N/A   Occupational History  . Works at [employer]    Social History Main Topics  . Smoking status:  Former Smoker    Quit date: 07/13/1996  . Smokeless tobacco: Never Used  . Alcohol Use: 0.0 oz/week     Comment: 5 drinks/week (light beer)  . Drug Use: No  . Sexual Activity: Yes    Partners: Male    Birth Control/ Protection: Surgical   Other Topics Concern  . Not on file   Social History Narrative   Lives with husband.  Daughter in [place],  2 sons in [place]. 1 dog   Family History  Problem Relation Age of Onset  . Osteoporosis Mother   . Arthritis Mother   . Diabetes Mother   . Hearing loss Mother   . Hypertension Father   . Cancer Father   . Heart disease Father 52    CABG  . Peripheral vascular disease Father     s/p bypasses  . Alcohol abuse Sister     recovered  . Urolithiasis Sister     Outpatient Encounter Prescriptions as of 02/01/2014  Medication Sig Note  . calcium carbonate (TUMS - DOSED IN MG ELEMENTAL CALCIUM) 500 MG chewable tablet Chew 2 tablets by mouth daily.    Marland Kitchen MAGNESIUM PO Take 2 tablets by mouth daily.    Marland Kitchen MELATONIN PO Take 1 tablet by mouth at bedtime.   . OMEGA 3 1200 MG CAPS Take 2,400 mg by mouth daily.    . polyethylene glycol (MIRALAX / GLYCOLAX) packet Take 17 g by mouth 2 (two) times daily.   . vitamin C (ASCORBIC ACID) 500 MG tablet Take 500 mg by mouth daily.   . vitamin D, CHOLECALCIFEROL, 400 UNITS tablet Take 400 Units by mouth daily.  02/01/2014: Unsure of dose.  Thinks she is just taking 1/day  . vitamin E 400 UNIT capsule Take 400 Units by mouth daily.   . [DISCONTINUED] docusate sodium 100 MG CAPS Take 100 mg by mouth 2 (two) times daily.   . [DISCONTINUED] ferrous sulfate 325 (65 FE) MG tablet Take 1 tablet (325 mg total) by mouth 3 (three) times daily after meals.   . ondansetron (ZOFRAN) 4 MG tablet Take 1 tablet (4 mg total) by mouth every 8 (eight) hours as needed for nausea or vomiting. 02/01/2014: Takes prn.  Only needed after surgery.  Nausea since has been mild; hasn't taken med  . [DISCONTINUED] methocarbamol (ROBAXIN) 500  MG tablet Take 1 tablet (500 mg total) by mouth every 6 (six) hours as needed for muscle spasms.   . [DISCONTINUED] promethazine (PHENERGAN) 12.5 MG tablet Take 1 tablet (12.5 mg total) by mouth every 6 (six) hours as needed for nausea or vomiting.   . [DISCONTINUED] promethazine (PHENERGAN) 25 MG tablet Take 1 tablet (25 mg total) by mouth every 6 (six) hours as needed for nausea.   . [DISCONTINUED] traMADol (ULTRAM) 50 MG tablet Take  1-2 tablets (50-100 mg total) by mouth every 6 (six) hours as needed for moderate pain.    No Known Allergies  ROS: Denies weight changes, anorexia, dizziness, syncope, dyspnea on exertion, cough, swelling, vomiting, diarrhea, constipation, abdominal pain, melena, hematochezia, indigestion/heartburn, hematuria, incontinence, dysuria, vaginal bleeding, discharge, odor or itch, genital lesions,numbness, tingling, weakness, tremor, suspicious skin lesions, depression, abnormal bleeding/bruising, or enlarged lymph nodes.  Numbness/tingling in both hands resolved s/p CT release surgery. Feet tingling resolved s/p surgery. Back pain is much improved s/p surgery. Hasn't had much neck pain from arthritis recently.  Intermittent insomnia (see HPI). Some dryness with intercourse, but does well with lubricant, tolerable  PHYSICAL EXAM:  BP 110/70  Pulse 68  Ht 5' 7"  (1.702 m)  Wt 132 lb (59.875 kg)  BMI 20.67 kg/m2   General Appearance:  Alert, cooperative, no distress, appears stated age   Head:  Normocephalic, without obvious abnormality, atraumatic   Eyes:  PERRL, conjunctiva/corneas clear, EOM's intact, fundi  benign   Ears:  Normal TM's and external ear canals   Nose:  Nares normal, mucosa normal, no drainage or sinus tenderness   Throat:  Lips, mucosa, and tongue normal; teeth and gums normal   Neck:  Supple, no lymphadenopathy; thyroid: no enlargement/tenderness/nodules; no carotid  bruit or JVD   Back:  Spine nontender, no curvature, ROM normal, no CVA  tenderness   Lungs:  Clear to auscultation bilaterally without wheezes, rales or ronchi; respirations unlabored   Chest Wall:  No tenderness or deformity   Heart:  Regular rate and rhythm, S1 and S2 normal, no murmur, rub  or gallop   Breast Exam:  No tenderness, masses, or nipple discharge or inversion. No axillary lymphadenopathy. + implants   Abdomen:  Soft, non-tender, nondistended, normoactive bowel sounds,  no masses, no hepatosplenomegaly   Genitalia:  Normal external genitalia without lesions. BUS and vagina normal; No abnormal vaginal discharge. Uterus and ovaries are surgically absent, nontender, no mass. Vaginal cuff appears normal. Pap of vaginal cuff obtained.   Rectal:  Normal tone, no masses or tenderness; heme negative light brown stool   Extremities:  No clubbing, cyanosis or edema.   Pulses:  2+ and symmetric all extremities   Skin:  Skin color, texture, turgor normal, no rashes or lesions   Lymph nodes:  Cervical, supraclavicular, and axillary nodes normal   Neurologic:  CNII-XII intact, normal strength, sensation and gait; reflexes 2+ and symmetric throughout          Psych: Normal mood, affect, hygiene and grooming. Mildly anxious   ASSESSMENT/PLAN:  Routine general medical examination at a health care facility - Plan: POCT Urinalysis Dipstick, Cytology - PAP [place], Lipid panel, Comprehensive metabolic panel, CBC with Differential, TSH  Insomnia - Plan: zolpidem (AMBIEN) 10 MG tablet  Impaired fasting glucose - Plan: Comprehensive metabolic panel  Hyperlipidemia - Plan: Lipid panel  Encounter for long-term (current) use of other medications - Plan: Magnesium   Discussed monthly self breast exams and yearly mammograms; at least 30 minutes of aerobic activity at least 5 days/week; proper sunscreen use reviewed; healthy diet, including goals of calcium and vitamin D intake and alcohol recommendations (less than or equal to 1 drink/day) reviewed; regular seatbelt  use; changing batteries in smoke detectors. Immunization recommendations discussed--flu shots recommended. Zostavax age 64. Colonoscopy recommendations reviewed; per pts history--states no polyps in past, no family h/o polyps or cancer, and therefore should be on every 10 year plan. Hemoccult kit given.   Insomnia:  ambien 54m 1/2-1 prn #30. Risks/side effects reviewed.  Anxiety will be less (and therefore less insomnia) just by having the rx (per pt).  Nausea--discussed potential etiologies.  Discussed a 2 week trial of Prilosec OTC, taken with dinner;  She reports symptoms are much improved, so will just try taking ginger, along with the tea.  Anemia s/p surgery--recheck today  IFG--A1c 5.8 in February.  Continue to get regular exercise and limit sweets CBC, TSH, c-met, lipid

## 2014-02-01 NOTE — Patient Instructions (Signed)
  HEALTH MAINTENANCE RECOMMENDATIONS:  It is recommended that you get at least 30 minutes of aerobic exercise at least 5 days/week (for weight loss, you may need as much as 60-90 minutes). This can be any activity that gets your heart rate up. This can be divided in 10-15 minute intervals if needed, but try and build up your endurance at least once a week.  Weight bearing exercise is also recommended twice weekly.  Eat a healthy diet with lots of vegetables, fruits and fiber.  "Colorful" foods have a lot of vitamins (ie green vegetables, tomatoes, red peppers, etc).  Limit sweet tea, regular sodas and alcoholic beverages, all of which has a lot of calories and sugar.  Up to 1 alcoholic drink daily may be beneficial for women (unless trying to lose weight, watch sugars).  Drink a lot of water.  Calcium recommendations are 1200-1500 mg daily (1500 mg for postmenopausal women or women without ovaries), and vitamin D 1000 IU daily.  This should be obtained from diet and/or supplements (vitamins), and calcium should not be taken all at once, but in divided doses.  Monthly self breast exams and yearly mammograms for women over the age of 67 is recommended.  Sunscreen of at least SPF 30 should be used on all sun-exposed parts of the skin when outside between the hours of 10 am and 4 pm (not just when at beach or pool, but even with exercise, golf, tennis, and yard work!)  Use a sunscreen that says "broad spectrum" so it covers both UVA and UVB rays, and make sure to reapply every 1-2 hours.  Remember to change the batteries in your smoke detectors when changing your clock times in the spring and fall.  Use your seat belt every time you are in a car, and please drive safely and not be distracted with cell phones and texting while driving.  Plan on getting zostavax next year (when 58)--you might want to verify with your insurance prior to your visit.  Try adding ginger to help with nausea.  Consider 2 week  trial of prilosec OTC if morning nausea and/or indigestion worsens

## 2014-04-10 ENCOUNTER — Encounter: Payer: BC Managed Care – PPO | Admitting: Family Medicine

## 2014-06-30 ENCOUNTER — Other Ambulatory Visit: Payer: Self-pay

## 2014-07-17 ENCOUNTER — Encounter: Payer: Self-pay | Admitting: Family Medicine

## 2014-12-06 LAB — HM MAMMOGRAPHY

## 2014-12-06 LAB — HM DEXA SCAN

## 2014-12-14 ENCOUNTER — Other Ambulatory Visit: Payer: Self-pay | Admitting: *Deleted

## 2014-12-14 ENCOUNTER — Encounter: Payer: Self-pay | Admitting: Internal Medicine

## 2014-12-14 ENCOUNTER — Encounter: Payer: Self-pay | Admitting: *Deleted

## 2015-02-07 ENCOUNTER — Ambulatory Visit (INDEPENDENT_AMBULATORY_CARE_PROVIDER_SITE_OTHER): Payer: BLUE CROSS/BLUE SHIELD | Admitting: Family Medicine

## 2015-02-07 ENCOUNTER — Encounter: Payer: Self-pay | Admitting: Family Medicine

## 2015-02-07 ENCOUNTER — Other Ambulatory Visit (HOSPITAL_COMMUNITY)
Admission: RE | Admit: 2015-02-07 | Discharge: 2015-02-07 | Disposition: A | Discharge status: Home / Self Care | Payer: BLUE CROSS/BLUE SHIELD | Source: Ambulatory Visit | Attending: Family Medicine | Admitting: Family Medicine

## 2015-02-07 VITALS — BP 136/82 | HR 72 | Ht 67.0 in | Wt 138.0 lb

## 2015-02-07 DIAGNOSIS — M8580 Other specified disorders of bone density and structure, unspecified site: Secondary | ICD-10-CM | POA: Diagnosis not present

## 2015-02-07 DIAGNOSIS — G4700 Insomnia, unspecified: Secondary | ICD-10-CM

## 2015-02-07 DIAGNOSIS — R7301 Impaired fasting glucose: Secondary | ICD-10-CM

## 2015-02-07 DIAGNOSIS — Z0000 Encounter for general adult medical examination without abnormal findings: Secondary | ICD-10-CM | POA: Diagnosis not present

## 2015-02-07 DIAGNOSIS — Z01411 Encounter for gynecological examination (general) (routine) with abnormal findings: Secondary | ICD-10-CM | POA: Diagnosis not present

## 2015-02-07 DIAGNOSIS — E785 Hyperlipidemia, unspecified: Secondary | ICD-10-CM

## 2015-02-07 DIAGNOSIS — Z1151 Encounter for screening for human papillomavirus (HPV): Secondary | ICD-10-CM | POA: Insufficient documentation

## 2015-02-07 DIAGNOSIS — Z23 Encounter for immunization: Secondary | ICD-10-CM

## 2015-02-07 DIAGNOSIS — Z8541 Personal history of malignant neoplasm of cervix uteri: Secondary | ICD-10-CM

## 2015-02-07 LAB — POCT URINALYSIS DIPSTICK
Bilirubin, UA: NEGATIVE
Blood, UA: NEGATIVE
Glucose, UA: NEGATIVE
Ketones, UA: NEGATIVE
Leukocytes, UA: NEGATIVE
Nitrite, UA: NEGATIVE
Protein, UA: NEGATIVE
Spec Grav, UA: 1.01
Urobilinogen, UA: NEGATIVE
pH, UA: 7

## 2015-02-07 LAB — POCT GLYCOSYLATED HEMOGLOBIN (HGB A1C): Hemoglobin A1C: 6.1

## 2015-02-07 MED ORDER — ALENDRONATE SODIUM 70 MG PO TABS: 70.0000 mg | ORAL_TABLET | ORAL | Status: DC

## 2015-02-07 MED ORDER — ZOLPIDEM TARTRATE 10 MG PO TABS: 5.0000 mg | ORAL_TABLET | Freq: Every evening | ORAL | Status: DC | PRN

## 2015-02-07 NOTE — Progress Notes (Signed)
Chief Complaint  Patient presents with  . Annual Exam    fasting annual with pap. Did not do eye exam as she recently had one. She did check and her insurance covers both zostavax and pneumonia. Would like to discuss chondroitin with you.    Tracey Santiago is a 59 y.o. female who presents for a complete physical.  She has the following concerns:  Follow up DEXA results 11/2014.  She stopped taking Tums due to worries about calcium build up in the arteries.  Has greek yogurt daily.  Only taking 400 IU of Vitamin D daily.,  IFG: She presents for follow-up.  She doesn't drink sugary drinks, and watches her sweets.  She has been to some diabetes education with her mother, and feels like she knows what she is supposed to eat.  Anxiety and insomnia:   She no longer has significant stressors that contributed to anxiety in the past, she only stresses about the inability to fall asleep. She takes melatonin nightly.She finds that having something at home to use just in case helps decrease the anxiety.She previously has taken both xanax and ambien to help with sleep. Found the ambien to be more effective and no side effects or unusual behaviors. Only needs to take 1/2 tablet with good results. Last prescribed #30 ambien $RemoveBe'10mg'[id number]$  1 year ago.She only recently finished the prescription.  Asking for refill.  "security blanket, if I can't sleep". Hasn't had xanax prescription in 2 years.  She is having some left hip pain (no longer having right hip pain since replacement). She has questions about chondroitin.  Immunization History  Administered Date(s) Administered  . Influenza Split 07/29/2012, 08/15/2013  . Tdap 09/15/2008  got her flu shot this year at the pharmacy Last Pap smear: 01/2014 of vaginal cuff--normal with no high risk HPV Last mammogram: 11/2014 Last colonoscopy: 11/07 --believes it was normal.   Last DEXA: 11/2014-- T-2.3 at distal L radius.  FRAX hip 3.9, major osteoporotic 33.8% (both  high). Dentist: twice yearly  Ophtho: yearly Exercise: She is active in the yard, walking the dogs. Upper body strength exercises 5x/week. Does yoga regularly.  She walks in 30 minute intervals throughout the day, about 7 miles total, about 7 days/week.  Lipids: Lab Results  Component Value Date   CHOL 257* 02/01/2014   HDL 121 02/01/2014   LDLCALC 126* 02/01/2014   TRIG 51 02/01/2014   CHOLHDL 2.1 02/01/2014   Past Medical History  Diagnosis Date  . Post-operative nausea and vomiting   . Cervical cancer 2005  . Impaired fasting glucose   . Carpal tunnel syndrome, bilateral     no problems s/p surgery bilaterally  . Arthritis     spine, right hand, hip  . DDD (degenerative disc disease), cervical     C4-5, C5-6, some oseophytosis, foraminal narrowing C3-4, C4-5    Past Surgical History  Procedure Laterality Date  . Cesarean section    . Tonsillectomy    . Abdominal hysterectomy  05/28/2004    pelvic lymphadenectomy, ovarian transposition, suprapubic cath. placement  . Posterior laminectomy / decompression lumbar spine  07/21/2011    arthrodesis L4-5 (Dr. Ellene Route)  . Carpal tunnel release  11/07/2011    Procedure: CARPAL TUNNEL RELEASE;  Surgeon: Cammie Sickle., MD;  Location: [facility];  Service: Orthopedics;  Laterality: Left;  . Breast surgery  1987    breast augmentation, implants replaced 11/2008  . Carpal tunnel release  04/08/2012    Procedure:  CARPAL TUNNEL RELEASE;  Surgeon: Cammie Sickle., MD;  Location: [facility];  Service: Orthopedics;  Laterality: Right;  . Hammer toe surgery Left 06/2012  . Childbirth      x3- 2 NVD, 1 c-section  . Total hip arthroplasty Right 12/13/2013    Procedure: RIGHT TOTAL HIP ARTHROPLASTY ANTERIOR APPROACH;  Surgeon: Mauri Pole, MD;  Location: [facility];  Service: Orthopedics;  Laterality: Right;    History   Social History  . Marital Status: Married    Spouse Name: N/A  . Number of  Children: 3  . Years of Education: N/A   Occupational History  . Works at [employer]    Social History Main Topics  . Smoking status: Former Smoker    Quit date: 07/13/1996  . Smokeless tobacco: Never Used  . Alcohol Use: 0.0 oz/week     Comment: 5 drinks/week (light beer)  . Drug Use: No  . Sexual Activity:    Partners: Male    Birth Control/ Protection: Surgical   Other Topics Concern  . Not on file   Social History Narrative   Lives with husband.  Daughter in [place],  2 sons in [place]. 1 dog    Family History  Problem Relation Age of Onset  . Osteoporosis Mother   . Arthritis Mother   . Diabetes Mother   . Hearing loss Mother   . Hypertension Father   . Cancer Father   . Heart disease Father 41    CABG  . Peripheral vascular disease Father     s/p bypasses  . Alcohol abuse Sister     recovered  . Urolithiasis Sister     Outpatient Encounter Prescriptions as of 02/07/2015  Medication Sig  . MAGNESIUM PO Take 2 tablets by mouth daily.   Marland Kitchen MELATONIN PO Take 1 tablet by mouth at bedtime.  . OMEGA 3 1200 MG CAPS Take 2,400 mg by mouth daily.   . polyethylene glycol (MIRALAX / GLYCOLAX) packet Take 17 g by mouth 2 (two) times daily.  . vitamin D, CHOLECALCIFEROL, 400 UNITS tablet Take 400 Units by mouth daily.   Marland Kitchen alendronate (FOSAMAX) 70 MG tablet Take 1 tablet (70 mg total) by mouth every 7 (seven) days. Take with a full glass of water on an empty stomach.  . calcium carbonate (TUMS - DOSED IN MG ELEMENTAL CALCIUM) 500 MG chewable tablet Chew 2 tablets by mouth daily.   Marland Kitchen zolpidem (AMBIEN) 10 MG tablet Take 0.5-1 tablets (5-10 mg total) by mouth at bedtime as needed for sleep.  . [DISCONTINUED] ondansetron (ZOFRAN) 4 MG tablet Take 1 tablet (4 mg total) by mouth every 8 (eight) hours as needed for nausea or vomiting.  . [DISCONTINUED] vitamin C (ASCORBIC ACID) 500 MG tablet Take 500 mg by mouth daily.  . [DISCONTINUED] vitamin E 400 UNIT capsule Take  400 Units by mouth daily.   No facility-administered encounter medications on file as of 02/07/2015.   No Known Allergies  ROS: Denies weight changes, anorexia, dizziness, syncope, dyspnea on exertion, cough, swelling, vomiting, diarrhea, constipation, abdominal pain, melena, hematochezia, indigestion/heartburn, hematuria, incontinence, dysuria, vaginal bleeding, discharge, odor or itch, genital lesions,numbness, tingling, weakness, tremor, suspicious skin lesions, depression, abnormal bleeding/bruising, or enlarged lymph nodes.  Bowels are normal, with daily use of miralax Numbness/tingling in both hands resolved s/p CT release surgery (very slight in one hand after doing weights). Feet tingling resolved s/p surgery. Back pain is much improved s/p surgery. Hasn't  had much neck pain from arthritis recently.  Intermittent insomnia (see HPI). Some dryness with intercourse, but does well with lubricant, tolerable 8# weight gain in the last year.    PHYSICAL EXAM:  BP 136/82 mmHg  Pulse 72  Ht _0  (1.702 m)  Wt 138 lb (62.596 kg)  BMI 21.61 kg/m2  General Appearance:  Alert, cooperative, no distress, appears stated age or somewhat younger  Head:  Normocephalic, without obvious abnormality, atraumatic   Eyes:  PERRL, conjunctiva/corneas clear, EOM's intact, fundi  benign   Ears:  Normal TM's and external ear canals   Nose:  Nares normal, mucosa normal, no drainage or sinus tenderness   Throat:  Lips, mucosa, and tongue normal; teeth and gums normal   Neck:  Supple, no lymphadenopathy; thyroid: no enlargement/tenderness/nodules; no carotid  bruit or JVD   Back:  Spine nontender, no curvature, ROM normal, no CVA tenderness   Lungs:  Clear to auscultation bilaterally without wheezes, rales or ronchi; respirations unlabored   Chest Wall:  No tenderness or deformity   Heart:  Regular rate and rhythm, S1 and S2 normal, no murmur, rub  or gallop   Breast Exam:  No  tenderness, masses, or nipple discharge or inversion. No axillary lymphadenopathy. + implants   Abdomen:  Soft, non-tender, nondistended, normoactive bowel sounds,  no masses, no hepatosplenomegaly   Genitalia:  Normal external genitalia without lesions. BUS and vagina normal; No abnormal vaginal discharge. Uterus and ovaries are surgically absent, nontender, no mass. Vaginal cuff appears normal. Pap of vaginal cuff obtained.   Rectal:  Normal tone, no masses or tenderness; heme negative light brown stool   Extremities:  No clubbing, cyanosis or edema.   Pulses:  2+ and symmetric all extremities   Skin:  Skin color, texture, turgor normal, no rashes or lesions   Lymph nodes:  Cervical, supraclavicular, and axillary nodes normal   Neurologic:  CNII-XII intact, normal strength, sensation and gait; reflexes 2+ and symmetric throughout    Psych: Normal mood, affect, hygiene and grooming.         Lab Results  Component Value Date   HGBA1C 5.8 10/17/2013    ASSESSMENT/PLAN:  Annual physical exam - Plan: TSH, Glucose, random, Cytology - PAP [place], POCT Urinalysis Dipstick  Impaired fasting glucose - declines nutrition consult. reviewed diet; continue regular exercise. consider metformin in future, if increasing A1c - Plan: HgB A1c  Hyperlipidemia - excellent HDL; no need to recheck this year. continue lowfat, low cholesterol diet  Insomnia - sporadic, intermittent; continue ambien prn - Plan: zolpidem (AMBIEN) 10 MG tablet  History of cervical cancer - Plan: Cytology - PAP [place]  Need for shingles vaccine - risks/side effects and efficacy was reviewed - Plan: Varicella-zoster vaccine subcutaneous  Osteopenia - with abnormal FRAX.  risks/side effects of bisphosphonates and other medications reviewed.  start alendronate; reviewed how to take. repeat DEXA 2 years - Plan: Vit D  25 hydroxy (rtn osteoporosis monitoring), alendronate  (FOSAMAX) 70 MG tablet  Glucose, A1c, TSH, vitamin D  Discussed monthly self breast exams and yearly mammograms; at least 30 minutes of aerobic activity at least 5 days/week; proper sunscreen use reviewed; healthy diet, including goals of calcium and vitamin D intake and alcohol recommendations (less than or equal to 1 drink/day) reviewed; regular seatbelt use; changing batteries in smoke detectors. Immunization recommendations discussed--flu shots recommended. Zostavax given today. Colonoscopy recommendations reviewed; per pts history--states no polyps in past, no family h/o polyps or cancer, and  therefore should be on every 10 year plan. Hemasure kit given.   Risks/side effects of zostavax were reviewed in detail.  Insomnia: ambien 37m 1/2-1 prn #30. Risks/side effects reviewed.  F/u 6 months

## 2015-02-07 NOTE — Patient Instructions (Addendum)
  HEALTH MAINTENANCE RECOMMENDATIONS:  It is recommended that you get at least 30 minutes of aerobic exercise at least 5 days/week (for weight loss, you may need as much as 60-90 minutes). This can be any activity that gets your heart rate up. This can be divided in 10-15 minute intervals if needed, but try and build up your endurance at least once a week.  Weight bearing exercise is also recommended twice weekly.  Eat a healthy diet with lots of vegetables, fruits and fiber.  "Colorful" foods have a lot of vitamins (ie green vegetables, tomatoes, red peppers, etc).  Limit sweet tea, regular sodas and alcoholic beverages, all of which has a lot of calories and sugar.  Up to 1 alcoholic drink daily may be beneficial for women (unless trying to lose weight, watch sugars).  Drink a lot of water.  Calcium recommendations are 1200-1500 mg daily (1500 mg for postmenopausal women or women without ovaries), and vitamin D 1000 IU daily.  This should be obtained from diet and/or supplements (vitamins), and calcium should not be taken all at once, but in divided doses.  Monthly self breast exams and yearly mammograms for women over the age of 89 is recommended.  Sunscreen of at least SPF 30 should be used on all sun-exposed parts of the skin when outside between the hours of 10 am and 4 pm (not just when at beach or pool, but even with exercise, golf, tennis, and yard work!)  Use a sunscreen that says "broad spectrum" so it covers both UVA and UVB rays, and make sure to reapply every 1-2 hours.  Remember to change the batteries in your smoke detectors when changing your clock times in the spring and fall.  Use your seat belt every time you are in a car, and please drive safely and not be distracted with cell phones and texting while driving.   Start taking the alendronate weekly for your bones.  Remember to take with a full glass of water and do not eat or recline for at least 30 minutes after.  Take it on an  empty stomach, separate from other medications.  We will be in touch with your lab results to let you know if Vitamin D levels ar eokay, or if doses need to be changed.  Try and get as much of the calcium from your diet, and make up the difference with supplements (see above recommendations).

## 2015-02-08 LAB — CYTOLOGY - PAP

## 2015-02-08 LAB — GLUCOSE, RANDOM: Glucose, Bld: 146 mg/dL — ABNORMAL HIGH (ref 70–99)

## 2015-02-08 LAB — VITAMIN D 25 HYDROXY (VIT D DEFICIENCY, FRACTURES): Vit D, 25-Hydroxy: 39 ng/mL (ref 30–100)

## 2015-02-08 LAB — TSH: TSH: 1.207 u[IU]/mL (ref 0.350–4.500)

## 2015-05-16 IMAGING — CR DG PORTABLE PELVIS
1 series · 1 of 1 positions shown · non-contrast
Comparison: None.

CLINICAL DATA: Right hip arthritis.

EXAM:
PORTABLE PELVIS 1-2 VIEWS

[AP]
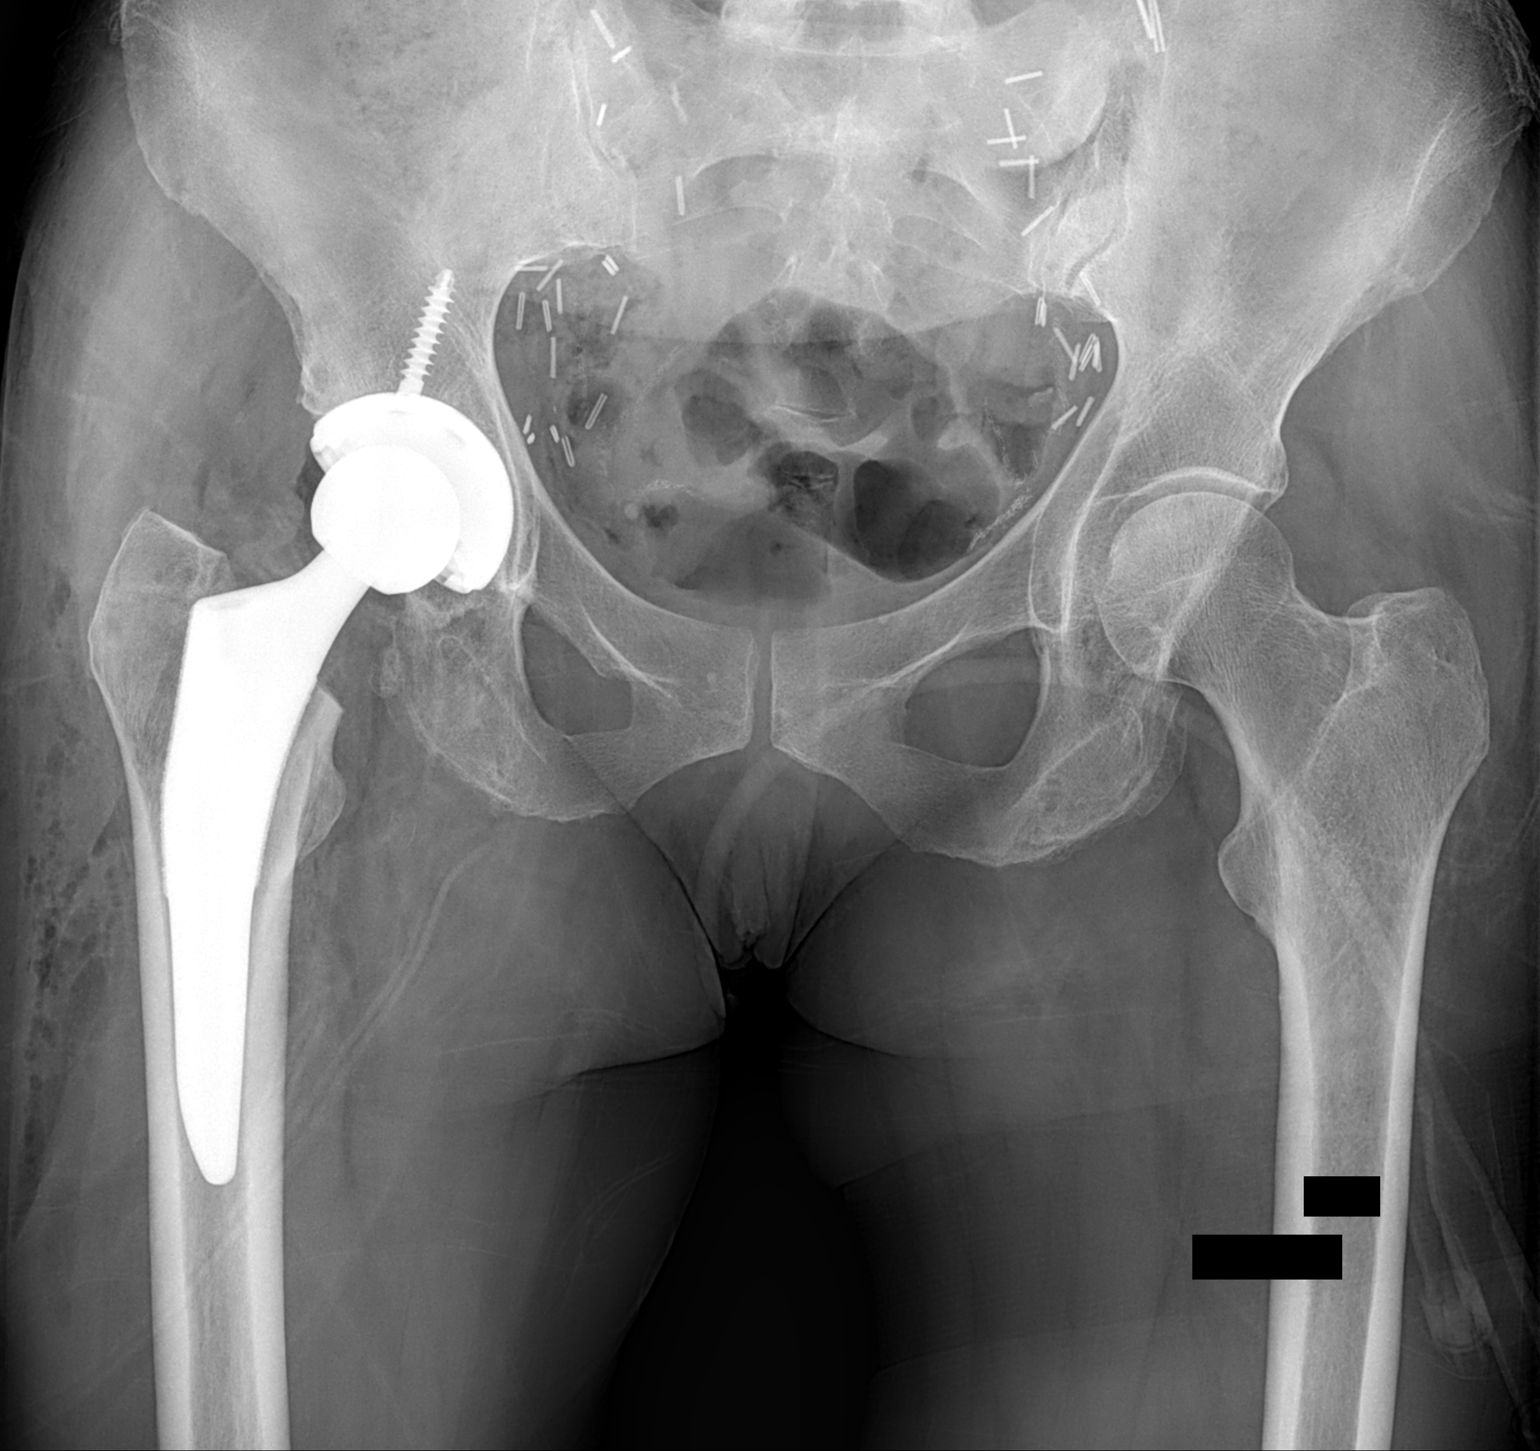

[1 of 1 positions shown; findings below may reference images not displayed]

FINDINGS: The patient has had right total hip arthroplasty. The components
appear in excellent position in the AP projection. No fractures.
Multiple surgical clips are seen in both sides of the pelvis.
IMPRESSION: Satisfactory postoperative appearance of the hip in the AP
projection after right total hip prosthesis insertion.

## 2015-05-16 IMAGING — CR DG HIP 1V PORT*R*
1 series · 1 of 1 positions shown · non-contrast
Comparison: None.

CLINICAL DATA: Arthritis of the right hip.

EXAM:
PORTABLE RIGHT HIP - 1 VIEW

[AP]
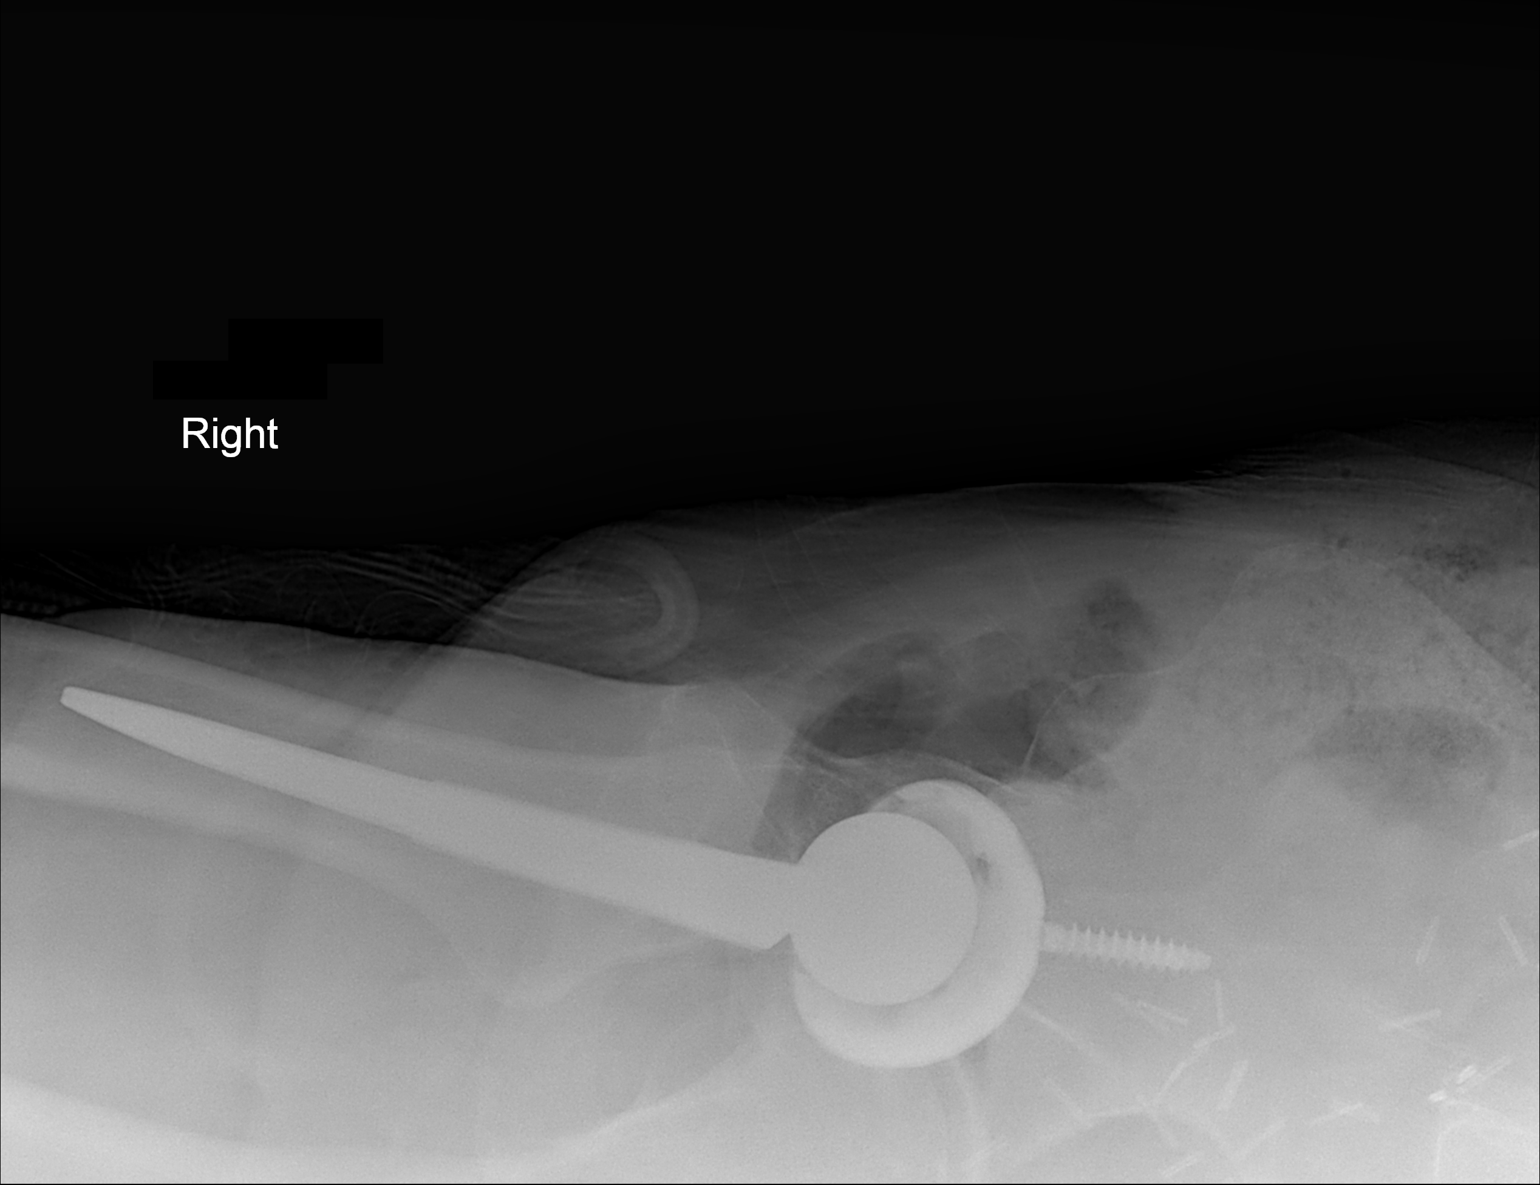

[1 of 1 positions shown; findings below may reference images not displayed]

FINDINGS: The patient has undergone a right total hip prosthesis insertion.
The acetabular and femoral components appear in excellent position
in the lateral projection. No acute osseous abnormality.
IMPRESSION: Satisfactory appearance of the right hip prosthesis in the lateral
projection.

## 2015-08-15 ENCOUNTER — Ambulatory Visit (INDEPENDENT_AMBULATORY_CARE_PROVIDER_SITE_OTHER): Payer: BLUE CROSS/BLUE SHIELD | Admitting: Family Medicine

## 2015-08-15 ENCOUNTER — Encounter: Payer: Self-pay | Admitting: Family Medicine

## 2015-08-15 VITALS — BP 118/72 | HR 64 | Ht 67.0 in | Wt 140.2 lb

## 2015-08-15 DIAGNOSIS — M8580 Other specified disorders of bone density and structure, unspecified site: Secondary | ICD-10-CM

## 2015-08-15 DIAGNOSIS — G4700 Insomnia, unspecified: Secondary | ICD-10-CM

## 2015-08-15 DIAGNOSIS — E119 Type 2 diabetes mellitus without complications: Secondary | ICD-10-CM | POA: Insufficient documentation

## 2015-08-15 DIAGNOSIS — Z23 Encounter for immunization: Secondary | ICD-10-CM | POA: Diagnosis not present

## 2015-08-15 LAB — POCT GLYCOSYLATED HEMOGLOBIN (HGB A1C): Hemoglobin A1C: 6.2

## 2015-08-15 LAB — GLUCOSE, POCT (MANUAL RESULT ENTRY): POC Glucose: 160 mg/dl — AB (ref 70–99)

## 2015-08-15 MED ORDER — METFORMIN HCL ER 500 MG PO TB24: 500.0000 mg | ORAL_TABLET | Freq: Every day | ORAL | Status: DC

## 2015-08-15 MED ORDER — ZOLPIDEM TARTRATE 10 MG PO TABS: 5.0000 mg | ORAL_TABLET | Freq: Every evening | ORAL | Status: DC | PRN

## 2015-08-15 NOTE — Progress Notes (Signed)
Chief Complaint  Patient presents with  . Med check    IFG and would like rx for ambien.   Osteopenia:  She was prescribed alendronate at her physical 6 months ago. She admits that she didn't start taking it until September, after her mother (with osteoporosis) had a bad fall, fractured her hip, ribs. She had slight heartburn when she first started it, but that has resolved. She is also taking a Bone Strength "natural" calcium.  Vitamin D level was normal on last check. She gets regular weight-bearing exercise.  IFG: She presents for 6 month follow-up. A1c at last visit was 6.1.  Fasting glucose then was 146.  She doesn't drink sugary drinks, and watches her sweets (some chocolate cake over the Thanksgiving holiday). She has been to some diabetes education with her mother, and feels like she knows what she is supposed to eat, declined nutrition consult at last visit.  Denies polydipsia, polyuria, vision changes, numbness, tingling.  Anxiety and insomnia:  Her main stress and anxiety is worrying about the inability to fall asleep. Despite her stressors related to her mother's health, she finds most of her worries at night, related to falling asleep. She does then admit to feeling overwhelmed at times, concerned for her mother (who is in independent living, having issues with her memory, likely needing to be moved in the very near future, etc) and that she worries about this a lot at night.  Doesn't have much anxiety during the day. She takes melatonin only sporadically now.She finds that having something at home to use just in case she can't fall asleep helps decrease the anxiety. She has been using [place] as needed, and has no side effects or unusual behaviors. Only needs to take 1/2 tablet with good results. Last prescribed #30 ambien 10mg  at her physical in May.  She uses it just once or twice a month. She has 2 tablets left, asking for refill.  PMH, PSH, SH and FH reviewed/updated  Outpatient  Encounter Prescriptions as of 08/15/2015  Medication Sig Note  . alendronate (FOSAMAX) 70 MG tablet Take 1 tablet (70 mg total) by mouth every 7 (seven) days. Take with a full glass of water on an empty stomach.   . BORON PO Take 1 capsule by mouth daily.   Marland Kitchen MAGNESIUM PO Take 2 tablets by mouth daily.    Marland Kitchen MELATONIN PO Take 1 tablet by mouth at bedtime.   . NON FORMULARY Take 3 capsules by mouth daily. 08/15/2015: BONE STRENGTH SUPPLEMENT (orders online)  . OMEGA 3 1200 MG CAPS Take 2,400 mg by mouth daily.    . polyethylene glycol (MIRALAX / GLYCOLAX) packet Take 17 g by mouth 2 (two) times daily. 02/07/2015: Takes nightly  . vitamin D, CHOLECALCIFEROL, 400 UNITS tablet Take 400 Units by mouth daily.  02/01/2014: Unsure of dose.  Thinks she is just taking 1/day  . metFORMIN (GLUCOPHAGE-XR) 500 MG 24 hr tablet Take 1 tablet (500 mg total) by mouth daily with breakfast.   . zolpidem (AMBIEN) 10 MG tablet Take 0.5-1 tablets (5-10 mg total) by mouth at bedtime as needed for sleep.   . [DISCONTINUED] calcium carbonate (TUMS - DOSED IN MG ELEMENTAL CALCIUM) 500 MG chewable tablet Chew 2 tablets by mouth daily.  02/07/2015: Hasn't been taking recently   No facility-administered encounter medications on file as of 08/15/2015.   (metformin rx'd today, not taking prior to visit).  No Known Allergies  ROS:  No fever, chills, headaches, dizziness, URI symptoms,  cough, shortness of breath, chest pain, palpitations, urinary complaints, bleeding, bruising, rash, numbness, tingling, polydipsia, polyuria, vision changes, depression or there concerns, except as noted in HPI.  PHYSICAL EXAM: BP 118/72 mmHg  Pulse 64  Ht 5\' 7"  (1.702 m)  Wt 140 lb 3.2 oz (63.594 kg)  BMI 21.95 kg/m2  Well developed, pleasant, well-appearing female in no distress Normal mood, affect, hygiene and grooming.  Normal speech, eye contact Alert and oriented, cranial nerves intact, normal gait  Fasting glucose today 160  Lab  Results  Component Value Date   HGBA1C 6.2 08/15/2015   ASSESSMENT/PLAN:  New onset type 2 diabetes mellitus ([place]) - DM diagnosed after 2 fglu >126. A1c only 6.2.  Reviewed complications, risks, natural course of illness.  Risks/benefits/side effects of metformin reviewed - Plan: metFORMIN (GLUCOPHAGE-XR) 500 MG 24 hr tablet, Glucose (CBG), HgB A1c  Need for prophylactic vaccination and inoculation against influenza - Plan: Flu Vaccine QUAD 36+ mos PF IM (Fluarix & Fluzone Quad PF)  Insomnia - continue sporadic, prn use of ambien - Plan: zolpidem (AMBIEN) 10 MG tablet  Osteopenia - continue alendronate, calcium and vitamin D, weight-bearing exercise.  Repeat DEXA 05/2017 (2 yrs from starting med)   New diagnosis of diabetes Discussed metformin at length--optional to start now, but recommended. Discussed checking blood sugars (can help with learning which foods might be causing higher blood sugars) but declines at this time  Discussed anxiety in detail--preventative and prn meds. It appears that currently she just needs ambien to help her sleep. Discussed symptoms for which she should consider using xanax, and/or a daily preventative med--not indicated now.  Osteopenia with abnormal FRAX score--continue alendronate.  Now tolerating without any side effects. Discussed that if GI side effects recur, stop med until resolved, and contact us--can consider monthly med rather than weekly.  Currently doing fine on alendronate, so continue.  Plan to repeat DEXA 05/2017, as this will be 2 years from when the alendronate was started (rather than 2 years from the last DEXA, since she didn't start med right away).  F/u at [facility] in 6 months, sooner prn Declines checking blood sugars, offered. Declines nutritionist.  Do diabetic foot exam and urine microalbumin at her next visit.  35 min visit, all spent counseling.

## 2015-08-15 NOTE — Patient Instructions (Signed)
Start the Metformin once daily, with/before your breakfast. If you develop any loose stools, you can add metamucil (or cut back on the miralax).  Let us know if you are having problems tolerating this medication.  Continue your healthy diet, avoiding sweets, soda, lots of juices, etc (sugary beverages). Continue your regular exercise.  Continue your calcium and vitamin D, as well as your weight-bearing exrecise. Continue the alendronate--let us know if you have any recurrent side effects.

## 2015-08-31 ENCOUNTER — Telehealth: Payer: Self-pay | Admitting: Family Medicine

## 2015-08-31 DIAGNOSIS — M8580 Other specified disorders of bone density and structure, unspecified site: Secondary | ICD-10-CM

## 2015-08-31 MED ORDER — ALENDRONATE SODIUM 70 MG PO TABS: 70.0000 mg | ORAL_TABLET | ORAL | Status: DC

## 2015-08-31 NOTE — Telephone Encounter (Signed)
Fax Request for new RX pt wants to get 12 tablets which is a 90 day supply from [facility]

## 2015-08-31 NOTE — Telephone Encounter (Signed)
This message doesn't say which medication.  I'm assuming it is referring to alendronate.  rx sent.

## 2016-02-13 ENCOUNTER — Encounter: Payer: Self-pay | Admitting: Family Medicine

## 2016-02-13 ENCOUNTER — Other Ambulatory Visit: Payer: Self-pay | Admitting: Family Medicine

## 2016-02-18 ENCOUNTER — Encounter: Payer: BLUE CROSS/BLUE SHIELD | Admitting: Family Medicine

## 2016-02-18 ENCOUNTER — Ambulatory Visit (INDEPENDENT_AMBULATORY_CARE_PROVIDER_SITE_OTHER): Payer: Managed Care, Other (non HMO) | Admitting: Family Medicine

## 2016-02-18 ENCOUNTER — Encounter: Payer: Self-pay | Admitting: Family Medicine

## 2016-02-18 VITALS — BP 126/72 | HR 60 | Ht 67.0 in | Wt 139.4 lb

## 2016-02-18 DIAGNOSIS — Z1159 Encounter for screening for other viral diseases: Secondary | ICD-10-CM

## 2016-02-18 DIAGNOSIS — E785 Hyperlipidemia, unspecified: Secondary | ICD-10-CM | POA: Diagnosis not present

## 2016-02-18 DIAGNOSIS — Z23 Encounter for immunization: Secondary | ICD-10-CM | POA: Diagnosis not present

## 2016-02-18 DIAGNOSIS — G4700 Insomnia, unspecified: Secondary | ICD-10-CM

## 2016-02-18 DIAGNOSIS — E119 Type 2 diabetes mellitus without complications: Secondary | ICD-10-CM

## 2016-02-18 DIAGNOSIS — M8580 Other specified disorders of bone density and structure, unspecified site: Secondary | ICD-10-CM

## 2016-02-18 DIAGNOSIS — Z0000 Encounter for general adult medical examination without abnormal findings: Secondary | ICD-10-CM | POA: Diagnosis not present

## 2016-02-18 LAB — POCT GLYCOSYLATED HEMOGLOBIN (HGB A1C): Hemoglobin A1C: 6.5

## 2016-02-18 LAB — POCT URINALYSIS DIPSTICK
Bilirubin, UA: NEGATIVE
Blood, UA: NEGATIVE
Glucose, UA: NEGATIVE
Ketones, UA: NEGATIVE
Leukocytes, UA: NEGATIVE
Nitrite, UA: NEGATIVE
Protein, UA: NEGATIVE
Spec Grav, UA: 1.01
Urobilinogen, UA: NEGATIVE
pH, UA: 6.5

## 2016-02-18 LAB — CBC WITH DIFFERENTIAL/PLATELET
Basophils Absolute: 60 cells/uL (ref 0–200)
Basophils Relative: 1 %
Eosinophils Absolute: 60 cells/uL (ref 15–500)
Eosinophils Relative: 1 %
HCT: 40.1 % (ref 35.0–45.0)
Hemoglobin: 13.4 g/dL (ref 11.7–15.5)
Lymphocytes Relative: 28 %
Lymphs Abs: 1680 cells/uL (ref 850–3900)
MCH: 31.7 pg (ref 27.0–33.0)
MCHC: 33.4 g/dL (ref 32.0–36.0)
MCV: 94.8 fL (ref 80.0–100.0)
MPV: 10.4 fL (ref 7.5–12.5)
Monocytes Absolute: 540 cells/uL (ref 200–950)
Monocytes Relative: 9 %
Neutro Abs: 3660 cells/uL (ref 1500–7800)
Neutrophils Relative %: 61 %
Platelets: 266 10*3/uL (ref 140–400)
RBC: 4.23 MIL/uL (ref 3.80–5.10)
RDW: 13 % (ref 11.0–15.0)
WBC: 6 10*3/uL (ref 4.0–10.5)

## 2016-02-18 MED ORDER — METFORMIN HCL ER 500 MG PO TB24: 500.0000 mg | ORAL_TABLET | Freq: Every day | ORAL | Status: DC

## 2016-02-18 MED ORDER — ZOLPIDEM TARTRATE 10 MG PO TABS: 5.0000 mg | ORAL_TABLET | Freq: Every evening | ORAL | Status: DC | PRN

## 2016-02-18 NOTE — Patient Instructions (Addendum)
  HEALTH MAINTENANCE RECOMMENDATIONS:  It is recommended that you get at least 30 minutes of aerobic exercise at least 5 days/week (for weight loss, you may need as much as 60-90 minutes). This can be any activity that gets your heart rate up. This can be divided in 10-15 minute intervals if needed, but try and build up your endurance at least once a week.  Weight bearing exercise is also recommended twice weekly.  Eat a healthy diet with lots of vegetables, fruits and fiber.  "Colorful" foods have a lot of vitamins (ie green vegetables, tomatoes, red peppers, etc).  Limit sweet tea, regular sodas and alcoholic beverages, all of which has a lot of calories and sugar.  Up to 1 alcoholic drink daily may be beneficial for women (unless trying to lose weight, watch sugars).  Drink a lot of water.  Calcium recommendations are 1200-1500 mg daily (1500 mg for postmenopausal women or women without ovaries), and vitamin D 1000 IU daily.  This should be obtained from diet and/or supplements (vitamins), and calcium should not be taken all at once, but in divided doses.  Monthly self breast exams and yearly mammograms for women over the age of 5 is recommended.  Sunscreen of at least SPF 30 should be used on all sun-exposed parts of the skin when outside between the hours of 10 am and 4 pm (not just when at beach or pool, but even with exercise, golf, tennis, and yard work!)  Use a sunscreen that says "broad spectrum" so it covers both UVA and UVB rays, and make sure to reapply every 1-2 hours.  Remember to change the batteries in your smoke detectors when changing your clock times in the spring and fall.  Use your seat belt every time you are in a car, and please drive safely and not be distracted with cell phones and texting while driving.    Please call to schedule your mammogram. Your colonoscopy should be due in November, you likely should get a letter/call from your gastroenterologist.  If not, call  them.  Continue the alendronate and metformin.  Return in 6 months.  Unless you hear from [place] with your current lab results that you need to be fasting next visit, it can be non-fasting.  Start taking enteric coated aspirin 81mg  daily. Please have the eye doctor send [place] a copy of your diabetic eye exam.

## 2016-02-18 NOTE — Progress Notes (Signed)
Chief Complaint  Patient presents with  . Annual Exam    fasting annual exam with pap/med check. Did not do eye exam sees Dr.Sally Sabra Heck and prefers to do with her. No concerns.    Tracey Santiago is a 60 y.o. female who presents for a complete physical and follow-up on chronic medical problems.  Osteopenia: She has been taking alendronate since September 2016 (recommended at CPE 01/2015, but actually started taking after her mother (with osteoporosis) had a bad fall, fractured her hip, ribs). She had slight heartburn when she first started it, but that has resolved. She is also taking a Bone Strength "natural" calcium. Vitamin D level was normal on last check. She gets regular weight-bearing exercise. Denies any side effects or dysphagia.  Diabetes: She presents for 6 month follow-up. Diagnosed 07/2015 after 2 fasting sugars in the 140's-150's (A1c 6.2). Started on Metformin 6 months ago and is tolerating without side effects. She has been very good regarding her diet, exercise. She doesn't drink sugary drinks, and watches her sweets. She has been to some diabetes education with her mother, and feels like she knows what she is supposed to eat; she has declined nutrition consult. Denies polydipsia, polyuria, vision changes, numbness, tingling.  Anxiety and insomnia: she is requesting refill of ambien--never filled the refill (it expired before she could get it); used less than 30 tablets in the last year.  She had a lot of stressors this year--mother passed away, son was found to have opioid addiction.  Her daughter is pregnant and just diagnosed with MS (mostly bladder problems).  In the past she worried mainly about the inability to fall asleep. She takes melatonin only sporadically now.She finds that having something at home to use just in case she can't fall asleep helps decrease the anxiety. She has been using [place] as needed, and has no side effects or unusual behaviors. Only needs  to take 1/2 tablet with good results.  Immunization History  Administered Date(s) Administered  . Influenza Split 07/29/2012, 08/15/2013  . Influenza,inj,Quad PF,36+ Mos 08/15/2015  . Tdap 09/15/2008  . Zoster 02/07/2015   Last Pap smear: 01/2015 of vaginal cuff--normal with no high risk HPV Last mammogram: 11/2014 Last colonoscopy: 11/07 --believes it was normal.  Last DEXA: 11/2014-- T-2.3 at distal L radius. FRAX hip 3.9, major osteoporotic 33.8% (both high). Dentist: twice yearly  Ophtho: yearly (has upcoming appointment) Exercise: She is active in the yard, walking the dogs. Upper body strength exercises 5x/week. Does yoga regularly. She walks in 20 minute intervals throughout the day, 7 days/week. Gets 15K steps daily.  Past Medical History  Diagnosis Date  . Post-operative nausea and vomiting   . Cervical cancer ([place]) 2005  . Impaired fasting glucose     DM diagnosed 07/2015 for 2 fasting glucose >126  . Carpal tunnel syndrome, bilateral     no problems s/p surgery bilaterally  . Arthritis     spine, right hand, hip  . DDD (degenerative disc disease), cervical     C4-5, C5-6, some oseophytosis, foraminal narrowing C3-4, C4-5    Past Surgical History  Procedure Laterality Date  . Cesarean section    . Tonsillectomy    . Abdominal hysterectomy  05/28/2004    pelvic lymphadenectomy, ovarian transposition, suprapubic cath. placement  . Posterior laminectomy / decompression lumbar spine  07/21/2011    arthrodesis L4-5 (Dr. Ellene Route)  . Carpal tunnel release  11/07/2011    Procedure: CARPAL TUNNEL RELEASE;  Surgeon: Youlanda Mighty Sypher  Brooke Bonito., MD;  Location: [facility];  Service: Orthopedics;  Laterality: Left;  . Breast surgery  1987    breast augmentation, implants replaced 11/2008  . Carpal tunnel release  04/08/2012    Procedure: CARPAL TUNNEL RELEASE;  Surgeon: Cammie Sickle., MD;  Location: [facility];  Service: Orthopedics;  Laterality:  Right;  . Hammer toe surgery Left 06/2012  . Childbirth      x3- 2 NVD, 1 c-section  . Total hip arthroplasty Right 12/13/2013    Procedure: RIGHT TOTAL HIP ARTHROPLASTY ANTERIOR APPROACH;  Surgeon: Mauri Pole, MD;  Location: [facility];  Service: Orthopedics;  Laterality: Right;    Social History   Social History  . Marital Status: Married    Spouse Name: N/A  . Number of Children: 3  . Years of Education: N/A   Occupational History  . Works at [employer]    Social History Main Topics  . Smoking status: Former Smoker    Quit date: 07/13/1996  . Smokeless tobacco: Never Used  . Alcohol Use: 0.0 oz/week    0 Standard drinks or equivalent per week     Comment: 5 drinks/week (light beer or wine)  . Drug Use: No  . Sexual Activity:    Partners: Male    Birth Control/ Protection: Surgical   Other Topics Concern  . Not on file   Social History Narrative   Lives with husband.  Daughter and son in [place],  1 son in [place]. 1 dog. Expecting grandchild (from daughter).   Son with opioid addiction (why he moved back to [place])   Daughter diagnosed with MS 2017    Family History  Problem Relation Age of Onset  . Osteoporosis Mother   . Arthritis Mother   . Diabetes Mother   . Hearing loss Mother   . Dementia Mother   . Hypertension Father   . Cancer Father   . Heart disease Father 63    CABG  . Peripheral vascular disease Father     s/p bypasses  . Alcohol abuse Sister     recovered  . Urolithiasis Sister   . Multiple sclerosis Daughter 21  . Drug abuse Son     opioid addiction    Outpatient Encounter Prescriptions as of 02/18/2016  Medication Sig Note  . alendronate (FOSAMAX) 70 MG tablet Take 1 tablet (70 mg total) by mouth every 7 (seven) days. Take with a full glass of water on an empty stomach.   . BORON PO Take 1 capsule by mouth daily. 02/18/2016: Takes nightly to help her resorb the bone vitamin  . MAGNESIUM PO Take 2 tablets by mouth daily.    Marland Kitchen  MELATONIN PO Take 1 tablet by mouth at bedtime. 02/18/2016: sporadically  . metFORMIN (GLUCOPHAGE-XR) 500 MG 24 hr tablet Take 1 tablet (500 mg total) by mouth daily with breakfast.   . NON FORMULARY Take 3 capsules by mouth daily. 08/15/2015: BONE STRENGTH SUPPLEMENT (orders online)  . polyethylene glycol (MIRALAX / GLYCOLAX) packet Take 17 g by mouth 2 (two) times daily. 02/07/2015: Takes nightly  . vitamin D, CHOLECALCIFEROL, 400 UNITS tablet Take 400 Units by mouth daily.  02/01/2014: Unsure of dose.  Thinks she is just taking 1/day  . OMEGA 3 1200 MG CAPS Take 2,400 mg by mouth daily. Reported on 02/18/2016 02/18/2016: Hasn't been taking regularly, just sporadic recently   No facility-administered encounter medications on file as of 02/18/2016.  No Known Allergies  ROS: Denies weight changes, anorexia, dizziness, syncope, dyspnea on exertion, cough, swelling, vomiting, diarrhea, constipation, abdominal pain, melena, hematochezia, indigestion/heartburn, hematuria, incontinence, dysuria, vaginal bleeding, discharge, odor or itch, genital lesions,numbness, tingling, weakness, tremor, suspicious skin lesions, depression, abnormal bleeding/bruising, or enlarged lymph nodes.  Bowels are normal, with daily use of miralax. Some bloating in the afternoons Numbness/tingling in both hands resolved s/p CT release surgery (very slight in one hand after doing weights--unchanged). Feet tingling resolved s/p surgery. Back pain is much improved s/p surgery. Hasn't had much neck pain from arthritis recently, improved with yoga.  Intermittent insomnia (see HPI). Some dryness with intercourse, but does well with lubricant, tolerable. Arthritis in her right hands (3rd, 4th fingers and thumbs), mostly stiffness in the mornings.\  PHYSICAL EXAM:  BP 138/80 mmHg  Pulse 60  Ht _0  (1.702 m)  Wt 139 lb 6.4 oz (63.231 kg)  BMI 21.83 kg/m2 126/72 on repeat by MD  General Appearance:  Alert, cooperative, no  distress, appears stated age or somewhat younger  Head:  Normocephalic, without obvious abnormality, atraumatic   Eyes:  PERRL, conjunctiva/corneas clear, EOM's intact, fundi  benign   Ears:  Normal TM's and external ear canals   Nose:  Nares normal, mucosa normal, no drainage or sinus tenderness   Throat:  Lips, mucosa, and tongue normal; teeth and gums normal   Neck:  Supple, no lymphadenopathy; thyroid: no enlargement/tenderness/nodules; no carotid  bruit or JVD   Back:  Spine nontender, no curvature, ROM normal, no CVA tenderness   Lungs:  Clear to auscultation bilaterally without wheezes, rales or ronchi; respirations unlabored   Chest Wall:  No tenderness or deformity   Heart:  Regular rate and rhythm, S1 and S2 normal, no murmur, rub  or gallop   Breast Exam:  No tenderness, masses, or nipple discharge or inversion. No axillary lymphadenopathy. + implants   Abdomen:  Soft, non-tender, nondistended, normoactive bowel sounds,  no masses, no hepatosplenomegaly   Genitalia:  Normal external genitalia without lesions. BUS and vagina normal; No abnormal vaginal discharge. Uterus and ovaries are surgically absent, nontender, no mass. Pap not performed this year.  Rectal:  Normal tone, no masses or tenderness; heme negative light brown stool   Extremities:  No clubbing, cyanosis or edema.   Pulses:  2+ and symmetric all extremities   Skin:  Skin color, texture, turgor normal, no rashes or lesions   Lymph nodes:  Cervical, supraclavicular, and axillary nodes normal   Neurologic:  CNII-XII intact, normal strength, sensation and gait; reflexes 2+ and symmetric throughout    Psych: Normal mood, affect, hygiene and grooming             Normal urine dip Normal diabetic foot exam  Lab Results  Component Value Date   HGBA1C 6.5 02/18/2016     ASSESSMENT/PLAN:  Annual physical exam -  Plan: POCT Urinalysis Dipstick, Lipid panel, Comprehensive metabolic panel, CBC with Differential/Platelet, TSH  Hyperlipidemia - reviewed goals and possible need for statin. In past, her HDL was excellent - Plan: Lipid panel, Comprehensive metabolic panel  Osteopenia - continue alendronate  Type 2 diabetes mellitus without complication, without long-term current use of insulin (HCC) - controlled; Continue metformin daily. diabetic eye exam upcoming - Plan: HgB A1c, Comprehensive metabolic panel, TSH, Microalbumin / creatinine urine ratio, Pneumococcal conjugate vaccine 13-valent, metFORMIN (GLUCOPHAGE-XR) 500 MG 24 hr tablet  Insomnia - continue prn use of ambien - Plan: zolpidem (AMBIEN) 10 MG tablet  Need  for hepatitis C screening test - Plan: Hepatitis C antibody  Immunization due - Plan: Pneumococcal conjugate vaccine 13-valent   c-met, lipids, TSH, urine microalb, CBC, Hep C Ab Start ASA 65m.  Counseled re: potential risks of diabetes.  Discussed use of ACEI in DM--BP normal, can hold off at this point if Urine microalbumin is negative; consider in future. May need statin--discussed indications; await labs.  Discussed monthly self breast exams and yearly mammograms; at least 30 minutes of aerobic activity at least 5 days/week ,weight bearing exercise at least 2x/wk; proper sunscreen use reviewed; healthy diet, including goals of calcium and vitamin D intake and alcohol recommendations (less than or equal to 1 drink/day) reviewed; regular seatbelt use; changing batteries in smoke detectors. Immunization recommendations discussed--continue yearly flu shots. Colonoscopy recommendations reviewed; per pts history--states no polyps in past, no family h/o polyps or cancer, and therefore should be on every 10 year plan, due later this year.   Osteopenia with abnormal FRAX score--continue alendronate. Now tolerating without any side effects. DIscussed calcium, vitamin D, weight-bearing  exercise. Plan to repeat DEXA 05/2017, as this will be 2 years from when the alendronate was started (rather than 2 years from the last DEXA, since she didn't start med right away).

## 2016-02-19 LAB — MICROALBUMIN / CREATININE URINE RATIO
Creatinine, Urine: 17 mg/dL — ABNORMAL LOW (ref 20–320)
Microalb Creat Ratio: 12 mcg/mg creat (ref <30)
Microalb, Ur: 0.2 mg/dL

## 2016-02-19 LAB — COMPREHENSIVE METABOLIC PANEL
ALT: 15 U/L (ref 6–29)
AST: 15 U/L (ref 10–35)
Albumin: 4.5 g/dL (ref 3.6–5.1)
Alkaline Phosphatase: 59 U/L (ref 33–130)
BUN: 19 mg/dL (ref 7–25)
CO2: 26 mmol/L (ref 20–31)
Calcium: 9.7 mg/dL (ref 8.6–10.4)
Chloride: 103 mmol/L (ref 98–110)
Creat: 0.72 mg/dL (ref 0.50–0.99)
Glucose, Bld: 135 mg/dL — ABNORMAL HIGH (ref 65–99)
Potassium: 4.3 mmol/L (ref 3.5–5.3)
Sodium: 137 mmol/L (ref 135–146)
Total Bilirubin: 0.7 mg/dL (ref 0.2–1.2)
Total Protein: 6.6 g/dL (ref 6.1–8.1)

## 2016-02-19 LAB — LIPID PANEL
Cholesterol: 253 mg/dL — ABNORMAL HIGH (ref 125–200)
HDL: 138 mg/dL (ref >=46)
LDL Cholesterol: 102 mg/dL (ref <130)
Total CHOL/HDL Ratio: 1.8 Ratio (ref <=5.0)
Triglycerides: 63 mg/dL (ref <150)
VLDL: 13 mg/dL (ref <30)

## 2016-02-19 LAB — TSH: TSH: 0.9 mIU/L

## 2016-02-19 LAB — HEPATITIS C ANTIBODY: HCV Ab: NEGATIVE

## 2016-02-25 LAB — HM MAMMOGRAPHY

## 2016-02-26 ENCOUNTER — Encounter: Payer: Self-pay | Admitting: Family Medicine

## 2016-05-28 LAB — HM DIABETES EYE EXAM

## 2016-06-02 ENCOUNTER — Encounter: Payer: Self-pay | Admitting: Family Medicine

## 2016-07-09 ENCOUNTER — Other Ambulatory Visit (INDEPENDENT_AMBULATORY_CARE_PROVIDER_SITE_OTHER): Payer: Managed Care, Other (non HMO)

## 2016-07-09 DIAGNOSIS — Z23 Encounter for immunization: Secondary | ICD-10-CM

## 2016-08-12 ENCOUNTER — Other Ambulatory Visit: Payer: Self-pay | Admitting: Family Medicine

## 2016-08-12 DIAGNOSIS — E119 Type 2 diabetes mellitus without complications: Secondary | ICD-10-CM

## 2016-08-25 ENCOUNTER — Other Ambulatory Visit: Payer: Self-pay | Admitting: Family Medicine

## 2016-08-25 DIAGNOSIS — G4700 Insomnia, unspecified: Secondary | ICD-10-CM

## 2016-08-25 NOTE — Telephone Encounter (Signed)
Last filled #30 in June. Okay for refill of #30

## 2016-08-25 NOTE — Telephone Encounter (Signed)
Is this okay to refill? 

## 2016-08-28 NOTE — Progress Notes (Signed)
Chief Complaint  Patient presents with  . Diabetes    fasting med check.     Diabetes: She presents for 6 month follow-up. Diagnosed 07/2015 after 2 fasting sugars in the 140's-150's (A1c 6.2). She is tolerating Metformin without side effects.  Lab Results  Component Value Date   HGBA1C 6.5 02/18/2016   She has been very good regarding her diet, exercise. She doesn't drink sugary drinks, and watches her sweets. She has been to some diabetes education with her mother, and feels like she knows what she is supposed to eat; she has declined nutrition consult when offered in the past. Denies polydipsia, polyuria, vision changes, numbness. She is sometimes thirsty at night.  She has some tingling in the left hand sometimes at night.  She has h/o CTS s/p surgery bilaterally.  This tingling is fairly new onset.  Denies any weakness.  She has some pain in her hands from arthritis, with some swelling and deformity. She is considering seeing a hand surgeon to discuss surgical options. Hand arthritis--some trouble opening things, trouble with carseat.  Her BPs have been normal and urine microalbumin was normal 6 months ago, so haven't started ACEI/ARB.  Lipids were excellent on last check, so not yet on any statin. HDL was excellent, and LDL just slightly over 100. Lab Results  Component Value Date   CHOL 253 (H) 02/18/2016   HDL 138 02/18/2016   LDLCALC 102 02/18/2016   TRIG 63 02/18/2016   CHOLHDL 1.8 02/18/2016   ASA was recommended at last visit. She admits to being inconsistent with taking this. Denies any side effects. She bruises easily, but denies any change.  Anxiety and insomnia: Anxiety related to worrying about whether or not she can sleep, which is alleviated by having ambien around just in case.  She has been using [place] as needed, and has no side effects or unusual behaviors. Only needs to take 1/2 tablet with good results, about 2x week. No longer taking melatonin, didn't  really find it effective.  Stressors have improved since last visit. Son (with opioid addiction) went to rehab, and is doing great, better since living in [place].  Daughter's health has been good (recently diagnosed with MS).  Granddaughter is healthy.  Osteopenia: She has been taking alendronate since September 2016 (recommended at CPE 01/2015, but actually started taking after her mother (with osteoporosis) had a bad fall, fractured her hip, ribs). She had slight heartburn when she first started it, but that has resolved. She is also taking a Bone Strength "natural" calcium. Vitamin D level was normal on last check. She gets regular weight-bearing exercise. Denies any side effects or dysphagia. Planning to repeat DEXA 05/2017  PMH, PSH, SH reviewed  Outpatient Encounter Prescriptions as of 09/01/2016  Medication Sig Note  . alendronate (FOSAMAX) 70 MG tablet Take 1 tablet (70 mg total) by mouth every 7 (seven) days. Take with a full glass of water on an empty stomach.   . BORON PO Take 1 capsule by mouth daily. 02/18/2016: Takes nightly to help her resorb the bone vitamin  . MAGNESIUM PO Take 2 tablets by mouth daily.    . metFORMIN (GLUCOPHAGE-XR) 500 MG 24 hr tablet TAKE ONE TABLET DAILY WITH BREAKFAST   . NON FORMULARY Take 3 capsules by mouth daily. 08/15/2015: BONE STRENGTH SUPPLEMENT (orders online)  . OMEGA 3 1200 MG CAPS Take 2,400 mg by mouth daily. Reported on 02/18/2016   . polyethylene glycol (MIRALAX / GLYCOLAX) packet Take 17 g by  mouth 2 (two) times daily. 02/07/2015: Takes nightly  . vitamin D, CHOLECALCIFEROL, 400 UNITS tablet Take 400 Units by mouth daily.  02/01/2014: Unsure of dose.  Thinks she is just taking 1/day  . zolpidem (AMBIEN) 10 MG tablet TAKE 1/2 TO 1 TABLET AT BEDTIME AS NEEDED FOR SLEEP   . aspirin EC 81 MG tablet Take 1 tablet (81 mg total) by mouth daily.   Marland Kitchen lisinopril (PRINIVIL,ZESTRIL) 5 MG tablet Take 1 tablet (5 mg total) by mouth daily.   . [DISCONTINUED]  MELATONIN PO Take 1 tablet by mouth at bedtime. 02/18/2016: sporadically   No facility-administered encounter medications on file as of 09/01/2016.    (lisinopril started today, not taking prior to visit).  No Known Allergies  ROS: no fever, chills, URI symptoms, chest pain, dysphagia, headaches, dizziness.  +hand arthritis as per HPI, insomnia.  PHYSICAL EXAM:  BP (!) 144/84 (BP Location: Left Arm, Patient Position: Sitting, Cuff Size: Normal)   Pulse 68   Ht 5' 7"  (1.702 m)   Wt 142 lb (64.4 kg)   BMI 22.24 kg/m   144/78 on repeat by MD Well developed, pleasant female in no distress Neck: no lymphadenopathy, thyromegaly or carotid bruit Heart: regular rate and rhythm Lungs: clear bilaterally Abdomen: soft, nontender, no organomegaly or mass Extremities: no edema, normal pulses. Arthritic bony abnormalities in both hands.   Lab Results  Component Value Date   HGBA1C 6.4 09/01/2016    ASSESSMENT/PLAN:  Type 2 diabetes mellitus without complication, without long-term current use of insulin ([facility]) - controlled, continue Metformin. Start ACEI given elevated BP today. Risks/side effects reviewed - Plan: HgB A1c, aspirin EC 81 MG tablet, lisinopril (PRINIVIL,ZESTRIL) 5 MG tablet  Elevated blood pressure reading - low sodium diet, monitor elsewhere. start low dose ACEI also for nephro protection  Medication monitoring encounter - Plan: Basic metabolic panel  Osteopenia, unspecified location - continue bisphosphonate  Continue alendronate. She had some questions about medications. Reviewed med options--has friends on injections--reports NOT prolia, thinks "new".  Reassured no reason to change therapy at this point.  Plans to see Gramig in January for consult re: hand arthritis and surgical options.  F/u 6 months for CPE with fasting labs prior  F/u 1 month for b-met since starting ACEI today

## 2016-09-01 ENCOUNTER — Ambulatory Visit (INDEPENDENT_AMBULATORY_CARE_PROVIDER_SITE_OTHER): Payer: Managed Care, Other (non HMO) | Admitting: Family Medicine

## 2016-09-01 ENCOUNTER — Encounter: Payer: Self-pay | Admitting: Family Medicine

## 2016-09-01 VITALS — BP 144/84 | HR 68 | Ht 67.0 in | Wt 142.0 lb

## 2016-09-01 DIAGNOSIS — M8580 Other specified disorders of bone density and structure, unspecified site: Secondary | ICD-10-CM | POA: Diagnosis not present

## 2016-09-01 DIAGNOSIS — E119 Type 2 diabetes mellitus without complications: Secondary | ICD-10-CM | POA: Diagnosis not present

## 2016-09-01 DIAGNOSIS — Z5181 Encounter for therapeutic drug level monitoring: Secondary | ICD-10-CM

## 2016-09-01 DIAGNOSIS — R030 Elevated blood-pressure reading, without diagnosis of hypertension: Secondary | ICD-10-CM

## 2016-09-01 LAB — POCT GLYCOSYLATED HEMOGLOBIN (HGB A1C): Hemoglobin A1C: 6.4

## 2016-09-01 MED ORDER — LISINOPRIL 5 MG PO TABS: 5.0000 mg | ORAL_TABLET | Freq: Every day | ORAL | 5 refills | Status: DC

## 2016-09-01 MED ORDER — ASPIRIN EC 81 MG PO TBEC: 81.0000 mg | DELAYED_RELEASE_TABLET | Freq: Every day | ORAL | Status: DC

## 2016-09-01 NOTE — Patient Instructions (Addendum)
Continue your current metformin dose and vitamins. Your diabetes is well controlled.  Start taking the aspirin more regularly (can be taken with your metformin).  I recommend getting the new shingles vaccine (Shingrix) when available. You will need to check with your insurance to see if it is covered.  It is a series of 2 injections, spaced 2 months apart.  Start lisinopril 5mg  once daily. Okay to take this along with your metformin in the morning. If you develop a cough, please let us know and we can change the medication. Periodically check your blood pressure elsewhere.  Try and follow a low sodium diet. If your blood pressure drops below [id number] systolic, and/or if you are having any dizziness, then please cut the pill in 1/2.

## 2016-09-27 ENCOUNTER — Other Ambulatory Visit: Payer: Self-pay | Admitting: Family Medicine

## 2016-09-27 DIAGNOSIS — M8580 Other specified disorders of bone density and structure, unspecified site: Secondary | ICD-10-CM

## 2016-10-02 ENCOUNTER — Other Ambulatory Visit: Payer: Self-pay

## 2016-10-09 ENCOUNTER — Other Ambulatory Visit: Payer: Managed Care, Other (non HMO)

## 2016-10-09 DIAGNOSIS — Z5181 Encounter for therapeutic drug level monitoring: Secondary | ICD-10-CM

## 2016-10-09 LAB — BASIC METABOLIC PANEL
BUN: 22 mg/dL (ref 7–25)
CO2: 24 mmol/L (ref 20–31)
Calcium: 9.7 mg/dL (ref 8.6–10.4)
Chloride: 102 mmol/L (ref 98–110)
Creat: 0.68 mg/dL (ref 0.50–0.99)
Glucose, Bld: 165 mg/dL — ABNORMAL HIGH (ref 65–99)
Potassium: 4.5 mmol/L (ref 3.5–5.3)
Sodium: 138 mmol/L (ref 135–146)

## 2016-11-13 ENCOUNTER — Other Ambulatory Visit: Payer: Self-pay | Admitting: Family Medicine

## 2016-11-13 DIAGNOSIS — E119 Type 2 diabetes mellitus without complications: Secondary | ICD-10-CM

## 2016-12-08 LAB — HM DEXA SCAN

## 2017-01-08 ENCOUNTER — Other Ambulatory Visit: Payer: Self-pay | Admitting: Family Medicine

## 2017-01-08 DIAGNOSIS — G4700 Insomnia, unspecified: Secondary | ICD-10-CM

## 2017-01-08 NOTE — Telephone Encounter (Signed)
Ok to refill #30

## 2017-01-08 NOTE — Telephone Encounter (Signed)
Called in Ambien. # 30 pt aware.

## 2017-01-21 ENCOUNTER — Ambulatory Visit (INDEPENDENT_AMBULATORY_CARE_PROVIDER_SITE_OTHER): Payer: Managed Care, Other (non HMO) | Admitting: Family Medicine

## 2017-01-21 ENCOUNTER — Encounter: Payer: Self-pay | Admitting: Family Medicine

## 2017-01-21 VITALS — BP 118/72 | HR 100 | Ht 66.5 in | Wt 141.2 lb

## 2017-01-21 DIAGNOSIS — E119 Type 2 diabetes mellitus without complications: Secondary | ICD-10-CM | POA: Diagnosis not present

## 2017-01-21 DIAGNOSIS — Z5181 Encounter for therapeutic drug level monitoring: Secondary | ICD-10-CM

## 2017-01-21 DIAGNOSIS — R0609 Other forms of dyspnea: Secondary | ICD-10-CM

## 2017-01-21 DIAGNOSIS — R000 Tachycardia, unspecified: Secondary | ICD-10-CM

## 2017-01-21 DIAGNOSIS — R0600 Dyspnea, unspecified: Secondary | ICD-10-CM

## 2017-01-21 LAB — CBC WITH DIFFERENTIAL/PLATELET
Basophils Absolute: 0 cells/uL (ref 0–200)
Basophils Relative: 0 %
Eosinophils Absolute: 78 cells/uL (ref 15–500)
Eosinophils Relative: 1 %
HCT: 38.9 % (ref 35.0–45.0)
Hemoglobin: 13.1 g/dL (ref 11.7–15.5)
Lymphocytes Relative: 27 %
Lymphs Abs: 2106 cells/uL (ref 850–3900)
MCH: 31.9 pg (ref 27.0–33.0)
MCHC: 33.7 g/dL (ref 32.0–36.0)
MCV: 94.6 fL (ref 80.0–100.0)
MPV: 10.4 fL (ref 7.5–12.5)
Monocytes Absolute: 858 cells/uL (ref 200–950)
Monocytes Relative: 11 %
Neutro Abs: 4758 cells/uL (ref 1500–7800)
Neutrophils Relative %: 61 %
Platelets: 266 10*3/uL (ref 140–400)
RBC: 4.11 MIL/uL (ref 3.80–5.10)
RDW: 12.6 % (ref 11.0–15.0)
WBC: 7.8 10*3/uL (ref 4.0–10.5)

## 2017-01-21 LAB — COMPREHENSIVE METABOLIC PANEL
ALT: 14 U/L (ref 6–29)
AST: 17 U/L (ref 10–35)
Albumin: 4.5 g/dL (ref 3.6–5.1)
Alkaline Phosphatase: 44 U/L (ref 33–130)
BUN: 24 mg/dL (ref 7–25)
CO2: 24 mmol/L (ref 20–31)
Calcium: 9.5 mg/dL (ref 8.6–10.4)
Chloride: 103 mmol/L (ref 98–110)
Creat: 1.6 mg/dL — ABNORMAL HIGH (ref 0.50–0.99)
Glucose, Bld: 130 mg/dL — ABNORMAL HIGH (ref 65–99)
Potassium: 4 mmol/L (ref 3.5–5.3)
Sodium: 138 mmol/L (ref 135–146)
Total Bilirubin: 0.5 mg/dL (ref 0.2–1.2)
Total Protein: 6.6 g/dL (ref 6.1–8.1)

## 2017-01-21 LAB — TSH: TSH: 0.89 mIU/L

## 2017-01-21 NOTE — Patient Instructions (Addendum)
We are referring you to the cardiologist for further evaluation of your shortness of breath with exertion.  Being female and diabetic, often anginal symptoms seem different, so I would feel better if you had a cardiac evaluation.  We are doing some of your labs (for your physical) a little early to rule out contributing factors such as anemia or electrolyte abnormalities.  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  I don't hear any wheezing and you aren't having any allergy symptoms. You are having symptoms with exertion even outside the home.   Continue your aspirin and current medications.  Return for physical as scheduled in June, and also for fasting labs prior.   I recommend getting the new shingles vaccine (Shingrix). You will need to check with your insurance to see if it is covered.  It is a series of 2 injections, spaced 2 months apart.

## 2017-01-21 NOTE — Progress Notes (Signed)
Chief Complaint  Patient presents with  . Shortness of Breath    and rapid heartbeat randomly. Sometime when walking up the stairs.     A couple of weeks ago she started feeling a little short of breath.  Felt a little winded while walking around, or while at work.  Feels "full" in her chest/throat.  Similar to the shortness of breath she felt when she would get hot during menopause.  She is remodeling in her house, so sleeping upstairs, having more stairs up and down. Even short of breath walking around a store, or going to the bathroom during the night.   Sometimes she notes some heart racing when she feels that way. Hasn't checked her pulse.  Not always racing with the shortness of breath.    She had some dizziness in yoga today (short lived, no other problems with dizziness, no associated heart racing or shortness of breath).  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  There has been a lot of dust.  No h/o asthma. She had runny nose over the winter, much better over the last month or two.  Denies nasal congestion, post-nasal drainage.  Diabetes--doesn't check sugars.  Compliant with metformin, denies side effects Lab Results  Component Value Date   HGBA1C 6.4 09/01/2016      PMH, PSH, SH reviewed +FH heart disease in father at age 7  Outpatient Encounter Prescriptions as of 01/21/2017  Medication Sig Note  . alendronate (FOSAMAX) 70 MG tablet TAKE ONE TABLET EVERY 7 DAYS WITH FULL GLASS OF WATER ON AN EMPTY STOMACH   . aspirin EC 81 MG tablet Take 1 tablet (81 mg total) by mouth daily.   Marland Kitchen BORON PO Take 1 capsule by mouth daily. 02/18/2016: Takes nightly to help her resorb the bone vitamin  . lisinopril (PRINIVIL,ZESTRIL) 5 MG tablet Take 1 tablet (5 mg total) by mouth daily.   Marland Kitchen MAGNESIUM PO Take 2 tablets by mouth daily.    . metFORMIN (GLUCOPHAGE-XR) 500 MG 24 hr tablet TAKE ONE TABLET DAILY WITH BREAKFAST   . NON FORMULARY Take 3 capsules by mouth daily. 08/15/2015: BONE  STRENGTH SUPPLEMENT (orders online)  . OMEGA 3 1200 MG CAPS Take 2,400 mg by mouth daily. Reported on 02/18/2016   . polyethylene glycol (MIRALAX / GLYCOLAX) packet Take 17 g by mouth 2 (two) times daily. 02/07/2015: Takes nightly  . TURMERIC PO Take 1 tablet by mouth daily.   . vitamin D, CHOLECALCIFEROL, 400 UNITS tablet Take 400 Units by mouth daily.  02/01/2014: Unsure of dose.  Thinks she is just taking 1/day  . zolpidem (AMBIEN) 10 MG tablet TAKE 1/2 TO 1 TABLET AT BEDTIME AS NEEDED FOR SLEEP    No facility-administered encounter medications on file as of 01/21/2017.    No Known Allergies   ROS:  No fever, chills, nausea, vomiting, diarrhea.  Stays well hydrated. Denies hypoglycemic symptoms, polyuria/polydipsia, dysuria, numbness, tingling.  Occasional heartburn at night. No chest pain. No rashes, or other complaints.  See HPI   PHYSICAL EXAM:  BP 118/72 (BP Location: Left Arm, Patient Position: Sitting, Cuff Size: Normal)   Pulse 100   Ht 5' 6.5" (1.689 m)   Wt 141 lb 3.2 oz (64 kg)   SpO2 98%   BMI 22.45 kg/m   Well appearing, pleasant female, in no distress HEENT: conjunctiva and sclera clear, nasal mucosa is mildly edematous, pale, no drainage. OP clear Neck: no lymphadenopathy, thyromegaly or mass Heart: regular rate and rhythm. No rub,  gallops. No ectopy Lungs: good air movement, no wheezes, rales, ronchi Extremities: no edema, 2+ pulses Abdomen: soft, nontender, no organomegaly or mass Skin: normal turgor, no rash Psych: normal mood, affect, hygiene and grooming Neuro: alert and oriented, cranial nerves intact, normal strength, gait   EKG: NSR 69-70, incomplete RBBB otherwise normal   ASSESSMENT/PLAN:  Dyspnea on exertion - Plan: EKG 12-Lead, CBC with Differential/Platelet, TSH, Comprehensive metabolic panel, Ambulatory referral to Cardiology  Medication monitoring encounter - Plan: CBC with Differential/Platelet, Comprehensive metabolic panel  Type 2 diabetes  mellitus without complication, without long-term current use of insulin ([facility]) - Plan: TSH, Comprehensive metabolic panel, Glucose, random  Tachycardia - Plan: EKG 12-Lead  DOE in a diabetic with FH DM in father at same age. r/o ischemia.  Incomplete RBBB on EKG. Refer to cardiology for further eval.  Released nonfasting orders for her physical for next month (TSH, CBC, c-met) EKG/rhythm strip  Added in future order for fasting glucose with fasting labs prior to physical (also will need lipids and urine microalb)  A1c not released in error today--will do at her physical next month.  f/u as scheduled in June for physical, sooner prn.

## 2017-01-22 ENCOUNTER — Other Ambulatory Visit: Payer: Self-pay | Admitting: Family Medicine

## 2017-01-22 DIAGNOSIS — R7989 Other specified abnormal findings of blood chemistry: Secondary | ICD-10-CM

## 2017-02-03 ENCOUNTER — Encounter: Payer: Self-pay | Admitting: Family Medicine

## 2017-02-10 NOTE — Progress Notes (Signed)
Cardiology Office Note   Date:  02/12/2017   ID:  Tracey Santiago, Tracey Santiago [birth year], MRN [id number]  PCP:  Rita Ohara, MD  Cardiologist:   Skeet Latch, MD   Chief Complaint  Patient presents with  . New Patient (Initial Visit)  . Shortness of Breath    occasionally.  . Dizziness    when doing yoga      History of Present Illness: Tracey Santiago is a 61 y.o. female with hyperlipidemia and diabetes who is being seen today for the evaluation of palpitations and shortness of breath at the request of Rita Ohara, MD.  Tracey Santiago saw Dr. Tomi Bamberger on 01/21/17 and reported a few weeks of shortness of breath and palpitations.  EKG at that time revealed sinus rhythm and no ischemia.  Thyroid function and blood counts were within normal limits.  Her creatinine was 1.6 up from her baseline of 0.7.  For the last month she has noticed intermittent palpitations.  At the time she was in the middle of a bathroom remodel at home.  She had also started taking lisinopril a few months ago.  This was stopped four days ago due to AKI.  Since then the palpitations have been better. Lately the palpitations have occurred 2-3 times per day.  Previously it was much worse.  They last approximately a few minutes at a time and are associated with shortness of breath, lightheadedness and dizziness.  It sometimes feels like her heart is beating into her throat. It is sometimes worse with exertion.  She tried deep breathing, which helps. Tracey Santiago denies lower extremity edema, orthopnea, or PND.  She doesn't exercises regularly but walks a lot at work without limitations.  She drinks three cups of coffee every morning and has no other caffeine throughout the day.  She denies over the counter medications or supplements.   Past Medical History:  Diagnosis Date  . Arthritis    spine, right hand, hip  . Carpal tunnel syndrome, bilateral    no problems s/p surgery bilaterally  . Cervical cancer ([facility]) 2005  . DDD  (degenerative disc disease), cervical    C4-5, C5-6, some oseophytosis, foraminal narrowing C3-4, C4-5  . Diabetes mellitus without complication ([facility])   . Hyperlipidemia    mild  . Hypertension   . Impaired fasting glucose    DM diagnosed 07/2015 for 2 fasting glucose >126  . Post-operative nausea and vomiting     Past Surgical History:  Procedure Laterality Date  . ABDOMINAL HYSTERECTOMY  05/28/2004   pelvic lymphadenectomy, ovarian transposition, suprapubic cath. placement  . BREAST SURGERY  1987   breast augmentation, implants replaced 11/2008  . CARPAL TUNNEL RELEASE  11/07/2011   Procedure: CARPAL TUNNEL RELEASE;  Surgeon: Cammie Sickle., MD;  Location: [facility];  Service: Orthopedics;  Laterality: Left;  . CARPAL TUNNEL RELEASE  04/08/2012   Procedure: CARPAL TUNNEL RELEASE;  Surgeon: Cammie Sickle., MD;  Location: [facility];  Service: Orthopedics;  Laterality: Right;  . CESAREAN SECTION    . childbirth     x3- 2 NVD, 1 c-section  . HAMMER TOE SURGERY Left 06/2012  . POSTERIOR LAMINECTOMY / DECOMPRESSION LUMBAR SPINE  07/21/2011   arthrodesis L4-5 (Dr. Ellene Route)  . TONSILLECTOMY    . TOTAL HIP ARTHROPLASTY Right 12/13/2013   Procedure: RIGHT TOTAL HIP ARTHROPLASTY ANTERIOR APPROACH;  Surgeon: Mauri Pole, MD;  Location: [facility];  Service: Orthopedics;  Laterality: Right;  Current Outpatient Prescriptions  Medication Sig Dispense Refill  . alendronate (FOSAMAX) 70 MG tablet TAKE ONE TABLET EVERY 7 DAYS WITH FULL GLASS OF WATER ON AN EMPTY STOMACH 12 tablet 2  . aspirin EC 81 MG tablet Take 1 tablet (81 mg total) by mouth daily.    Marland Kitchen BORON PO Take 1 capsule by mouth daily.    Marland Kitchen MAGNESIUM PO Take 2 tablets by mouth daily.     . metFORMIN (GLUCOPHAGE-XR) 500 MG 24 hr tablet TAKE ONE TABLET EACH DAY WITH BREAKFAST 90 tablet 0  . NON FORMULARY Take 3 capsules by mouth daily.    . OMEGA 3 1200 MG CAPS Take 2,400 mg by mouth daily.  Reported on 02/18/2016    . polyethylene glycol (MIRALAX / GLYCOLAX) packet Take 17 g by mouth 2 (two) times daily. 14 each 0  . TURMERIC PO Take 1 tablet by mouth daily.    . vitamin D, CHOLECALCIFEROL, 400 UNITS tablet Take 400 Units by mouth daily.     Marland Kitchen zolpidem (AMBIEN) 10 MG tablet TAKE 1/2 TO 1 TABLET AT BEDTIME AS NEEDED FOR SLEEP 30 tablet 0  . lisinopril (PRINIVIL,ZESTRIL) 5 MG tablet Take 1 tablet (5 mg total) by mouth daily. (Patient not taking: Reported on 02/11/2017) 30 tablet 5   No current facility-administered medications for this visit.     Allergies:   Patient has no known allergies.    Social History:  The patient  reports that she quit smoking about 20 years ago. She has never used smokeless tobacco. She reports that she drinks alcohol. She reports that she does not use drugs.   Family History:  The patient's family history includes Alcohol abuse in her sister; Arthritis in her mother; Cancer in her father; Dementia in her mother; Diabetes in her mother; Drug abuse in her son; Hearing loss in her mother; Heart disease in her maternal grandfather; Heart disease (age of onset: 86) in her father; Hypertension in her father; Lung cancer in her father; Multiple sclerosis (age of onset: 57) in her daughter; Osteoporosis in her mother; Peripheral vascular disease in her father; Urolithiasis in her sister.    ROS:  Please see the history of present illness.   Otherwise, review of systems are positive for none.   All other systems are reviewed and negative.    PHYSICAL EXAM: VS:  BP 137/78   Pulse 72   Ht 5\' 7"  (1.702 m)   Wt 64.5 kg (142 lb 3.2 oz)   BMI 22.27 kg/m  , BMI Body mass index is 22.27 kg/m. GENERAL:  Well appearing HEENT:  Pupils equal round and reactive, fundi not visualized, oral mucosa unremarkable NECK:  No jugular venous distention, waveform within normal limits, carotid upstroke brisk and symmetric, no bruits, no thyromegaly LYMPHATICS:  No cervical  adenopathy LUNGS:  Clear to auscultation bilaterally HEART:  RRR.  PMI not displaced or sustained,S1 and S2 within normal limits, no S3, no S4, no clicks, no rubs, no murmurs ABD:  Flat, positive bowel sounds normal in frequency in pitch, no bruits, no rebound, no guarding, no midline pulsatile mass, no hepatomegaly, no splenomegaly EXT:  2 plus pulses throughout, no edema, no cyanosis no clubbing SKIN:  No rashes no nodules NEURO:  Cranial nerves II through XII grossly intact, motor grossly intact throughout PSYCH:  Cognitively intact, oriented to person place and time    EKG:  EKG is not ordered today. The ekg ordered 5/91/18 demonstrates sinus rhythm.  Rate 69  bpm.  Incomplete RBBB.   Recent Labs: 01/21/2017: ALT 14; BUN 24; Creat 1.60; Hemoglobin 13.1; Platelets 266; Potassium 4.0; Sodium 138; TSH 0.89    Lipid Panel    Component Value Date/Time   CHOL 253 (H) 02/18/2016 0826   TRIG 63 02/18/2016 0826   HDL 138 02/18/2016 0826   CHOLHDL 1.8 02/18/2016 0826   VLDL 13 02/18/2016 0826   LDLCALC 102 02/18/2016 0826      Wt Readings from Last 3 Encounters:  02/11/17 64.5 kg (142 lb 3.2 oz)  01/21/17 64 kg (141 lb 3.2 oz)  09/01/16 64.4 kg (142 lb)      ASSESSMENT AND PLAN:  # Palpitations:  Likely PACs/PVCs.  We will get a 48 hour event monitor.  Labs are unremarkable.  # Hypertension: Lisinopril was recently added and was stopped due to AKI.  BP is slightly above goal today.  We will check a monitor as above and likely start a beta blocker.  # CV Disease prevention: Continue aspirin.  Unable to accurately calculate ASCVD 10 year risk due to very high HDL.  It does not seem that a statin is indicated at this time.  Current medicines are reviewed at length with the patient today.  The patient does not have concerns regarding medicines.  The following changes have been made:  no change  Labs/ tests ordered today include:   Orders Placed This Encounter  Procedures  .  Holter monitor - 48 hour     Disposition:   FU with Abas Leicht C. Oval Linsey, MD, [facility] in 1 month    This note was written with the assistance of speech recognition software.  Please excuse any transcriptional errors.  Signed, Izmael Duross C. Oval Linsey, MD, [facility]  02/12/2017 4:57 PM    [place]

## 2017-02-11 ENCOUNTER — Ambulatory Visit (INDEPENDENT_AMBULATORY_CARE_PROVIDER_SITE_OTHER): Payer: Managed Care, Other (non HMO) | Admitting: Cardiovascular Disease

## 2017-02-11 ENCOUNTER — Other Ambulatory Visit: Payer: Self-pay | Admitting: Family Medicine

## 2017-02-11 ENCOUNTER — Encounter: Payer: Self-pay | Admitting: Cardiovascular Disease

## 2017-02-11 VITALS — BP 137/78 | HR 72 | Ht 67.0 in | Wt 142.2 lb

## 2017-02-11 DIAGNOSIS — I10 Essential (primary) hypertension: Secondary | ICD-10-CM | POA: Diagnosis not present

## 2017-02-11 DIAGNOSIS — R002 Palpitations: Secondary | ICD-10-CM

## 2017-02-11 DIAGNOSIS — E119 Type 2 diabetes mellitus without complications: Secondary | ICD-10-CM

## 2017-02-11 NOTE — Patient Instructions (Signed)
Medication Instructions:  Your physician recommends that you continue on your current medications as directed. Please refer to the Current Medication list given to you today.  Labwork: none  Testing/Procedures: Call the office at [phone] to get 48 hour holter scheduled  Your physician has recommended that you wear a holter monitor. Holter monitors are medical devices that record the heart's electrical activity. Doctors most often use these monitors to diagnose arrhythmias. Arrhythmias are problems with the speed or rhythm of the heartbeat. The monitor is a small, portable device. You can wear one while you do your normal daily activities. This is usually used to diagnose what is causing palpitations/syncope (passing out).  Follow-Up: About 2 weeks after the monitor  If you need a refill on your cardiac medications before your next appointment, please call your pharmacy.

## 2017-02-15 ENCOUNTER — Encounter: Payer: Self-pay | Admitting: Cardiovascular Disease

## 2017-03-04 ENCOUNTER — Ambulatory Visit: Payer: Managed Care, Other (non HMO) | Admitting: Physician Assistant

## 2017-03-04 ENCOUNTER — Other Ambulatory Visit: Payer: Managed Care, Other (non HMO)

## 2017-03-04 DIAGNOSIS — E119 Type 2 diabetes mellitus without complications: Secondary | ICD-10-CM

## 2017-03-04 DIAGNOSIS — R7989 Other specified abnormal findings of blood chemistry: Secondary | ICD-10-CM

## 2017-03-04 DIAGNOSIS — Z5181 Encounter for therapeutic drug level monitoring: Secondary | ICD-10-CM

## 2017-03-04 LAB — LIPID PANEL
Cholesterol: 257 mg/dL — ABNORMAL HIGH (ref <200)
HDL: 130 mg/dL (ref >50)
LDL Cholesterol: 116 mg/dL — ABNORMAL HIGH (ref <100)
Total CHOL/HDL Ratio: 2 Ratio (ref <5.0)
Triglycerides: 56 mg/dL (ref <150)
VLDL: 11 mg/dL (ref <30)

## 2017-03-04 LAB — BASIC METABOLIC PANEL
BUN: 19 mg/dL (ref 7–25)
CO2: 25 mmol/L (ref 20–31)
Calcium: 9.3 mg/dL (ref 8.6–10.4)
Chloride: 102 mmol/L (ref 98–110)
Creat: 0.82 mg/dL (ref 0.50–0.99)
Glucose, Bld: 140 mg/dL — ABNORMAL HIGH (ref 65–99)
Potassium: 4.1 mmol/L (ref 3.5–5.3)
Sodium: 138 mmol/L (ref 135–146)

## 2017-03-05 LAB — MICROALBUMIN / CREATININE URINE RATIO
Creatinine, Urine: 13 mg/dL — ABNORMAL LOW (ref 20–320)
Microalb Creat Ratio: 23 mcg/mg creat (ref <30)
Microalb, Ur: 0.3 mg/dL

## 2017-03-05 LAB — HEMOGLOBIN A1C
Hgb A1c MFr Bld: 6.4 % — ABNORMAL HIGH (ref <5.7)
Mean Plasma Glucose: 137 mg/dL

## 2017-03-06 ENCOUNTER — Encounter: Payer: Self-pay | Admitting: Gastroenterology

## 2017-03-11 NOTE — Progress Notes (Signed)
Chief Complaint  Patient presents with  . Annual Exam    nonfasting annual exam with pap. Sees Dr Marica Otter and is UTD. SOB is almost totally gone. Did not do heart monitor due to potential cost.     Tracey Santiago is a 61 y.o. female who presents for a complete physical.  She has the following concerns:  She was referred to cardiology with complaint of palpitations and DOE. Holter monitor was ordered, but not done [employer] issue, didn't know the code to know cost--never heard back about the cost).  Symptoms have been very minor since then, most days not having any.  Only notices shortness of breath and fast heartbeat when out in the heat.  Diabetes:  Diagnosed 07/2015 after 2 fasting sugars in the 140's-150's (A1c 6.2). She is tolerating Metformin without side effects.  She has been very good regarding her diet, exercise. She doesn't drink sugary drinks, and watches her sweets. She has been to some diabetes education with her mother, and feels like she knows what she is supposed to eat; she has declined nutrition consult when offered in the past. Denies polydipsia, polyuria, vision changes, numbness, tingling (has arthritis in her fingers, no longer numb).  She saw Dr. Amedeo Plenty, surgery not recommended since not very painful.  Her BP was elevated at last visit, so lisinopril was started, but this was stopped when creatinine bumped up to 1.6.  BP's have been running similar to here, sometimes sees "90's", can't recall.  Hasn't been recording.  Lipids were excellent in the past, with very high HDL, so not put on statin (cardiologist agrees with this recommendation). She has been compliant with taking her aspirin daily. Denies any side effects. She bruises easily, but denies any change.  Anxiety and insomnia: Anxiety related to worrying about whether or not she can sleep, which is alleviated by having ambien around just in case.  She has been using [place] as needed, and has no side effects  or unusual behaviors. Only needs to take 1/2 tablet with good results, about 2x week. No longer taking melatonin, didn't really find it effective.  Stressors have improved since last visit. Son (with opioid addiction) went to rehab, and is doing great, better since living in [place].  Daughter's health has been good (recently diagnosed with MS).  Granddaughter is healthy.  Osteopenia: She has been taking alendronate since September 2016. She had slight heartburn when she first started it, but that has resolved. She is also taking a Bone Strength "natural" calcium. Vitamin D level was normal on last check. She gets regular weight-bearing exercise. Denies any side effects or dysphagia.    Immunization History  Administered Date(s) Administered  . Influenza Split 07/29/2012, 08/15/2013  . Influenza,inj,Quad PF,36+ Mos 08/15/2015, 07/09/2016  . Pneumococcal Conjugate-13 02/18/2016  . Tdap 09/15/2008  . Zoster 02/07/2015   Last Pap smear: 01/2015 of vaginal cuff--normal with no high risk HPV Last mammogram: 02/2016, has one scheduled for July. Last colonoscopy: 11/07 --believes it was normal. Has one scheduled for August with [facility] Last DEXA: 11/2016-- T-2.1 L fem neck, stable (I had wanted this done 05/2017, 2 years from when meds started, but was done when [place] told her, 2 years from last test). Dentist: twice yearly  Ophtho: yearly  Exercise: She is active in the yard, walking the dogs. Upper body strength exercises 5x/week. Does yoga regularly (intensified the level). She walks in 20 minute intervals throughout the day, 7 days/week. Gets 15K steps daily.  Past  Medical History:  Diagnosis Date  . Arthritis    spine, right hand, hip  . Carpal tunnel syndrome, bilateral    no problems s/p surgery bilaterally  . Cervical cancer ([place]) 2005  . DDD (degenerative disc disease), cervical    C4-5, C5-6, some oseophytosis, foraminal narrowing C3-4, C4-5  . Diabetes mellitus without  complication ([place])   . Hyperlipidemia    mild  . Hypertension   . Impaired fasting glucose    DM diagnosed 07/2015 for 2 fasting glucose >126  . Post-operative nausea and vomiting     Past Surgical History:  Procedure Laterality Date  . ABDOMINAL HYSTERECTOMY  05/28/2004   pelvic lymphadenectomy, ovarian transposition, suprapubic cath. placement  . BREAST SURGERY  1987   breast augmentation, implants replaced 11/2008  . CARPAL TUNNEL RELEASE  11/07/2011   Procedure: CARPAL TUNNEL RELEASE;  Surgeon: Cammie Sickle., MD;  Location: [facility];  Service: Orthopedics;  Laterality: Left;  . CARPAL TUNNEL RELEASE  04/08/2012   Procedure: CARPAL TUNNEL RELEASE;  Surgeon: Cammie Sickle., MD;  Location: [facility];  Service: Orthopedics;  Laterality: Right;  . CESAREAN SECTION    . childbirth     x3- 2 NVD, 1 c-section  . HAMMER TOE SURGERY Left 06/2012  . POSTERIOR LAMINECTOMY / DECOMPRESSION LUMBAR SPINE  07/21/2011   arthrodesis L4-5 (Dr. Ellene Route)  . TONSILLECTOMY    . TOTAL HIP ARTHROPLASTY Right 12/13/2013   Procedure: RIGHT TOTAL HIP ARTHROPLASTY ANTERIOR APPROACH;  Surgeon: Mauri Pole, MD;  Location: [facility];  Service: Orthopedics;  Laterality: Right;    Social History   Social History  . Marital status: Married    Spouse name: N/A  . Number of children: 3  . Years of education: N/A   Occupational History  . Works at [employer]   Social History Main Topics  . Smoking status: Former Smoker    Quit date: 07/13/1996  . Smokeless tobacco: Never Used  . Alcohol use 0.0 oz/week     Comment: 5 drinks/week (light beer or wine)  . Drug use: No  . Sexual activity: Yes    Partners: Male    Birth control/ protection: Surgical   Other Topics Concern  . Not on file   Social History Narrative   Lives with husband.  Daughter and son in [place],  1 son in [place] (and travels a lot to [place]).    Son with  opioid addiction (why he moved back to [facility])   Daughter diagnosed with MS 2017.  Has 1 granddaughter ([place])    Family History  Problem Relation Age of Onset  . Osteoporosis Mother   . Arthritis Mother   . Diabetes Mother   . Hearing loss Mother   . Dementia Mother   . Hypertension Father   . Cancer Father   . Heart disease Father 42       CABG  . Peripheral vascular disease Father        s/p bypasses  . Lung cancer Father   . Alcohol abuse Sister        recovered  . Urolithiasis Sister   . Multiple sclerosis Daughter 63  . Drug abuse Son        opioid addiction  . Heart disease Maternal Grandfather     Outpatient Encounter Prescriptions as of 03/12/2017  Medication Sig Note  . alendronate (FOSAMAX) 70 MG tablet TAKE ONE TABLET EVERY 7  DAYS WITH FULL GLASS OF WATER ON AN EMPTY STOMACH   . aspirin EC 81 MG tablet Take 1 tablet (81 mg total) by mouth daily.   Marland Kitchen BORON PO Take 1 capsule by mouth daily. 02/18/2016: Takes nightly to help her resorb the bone vitamin  . MAGNESIUM PO Take 2 tablets by mouth daily.    . metFORMIN (GLUCOPHAGE-XR) 500 MG 24 hr tablet TAKE ONE TABLET EACH DAY WITH BREAKFAST   . NON FORMULARY Take 3 capsules by mouth daily. 08/15/2015: BONE STRENGTH SUPPLEMENT (orders online)  . OMEGA 3 1200 MG CAPS Take 2,400 mg by mouth daily. Reported on 02/18/2016   . polyethylene glycol (MIRALAX / GLYCOLAX) packet Take 17 g by mouth 2 (two) times daily. 02/07/2015: Takes nightly  . TURMERIC PO Take 1 tablet by mouth daily.   . vitamin D, CHOLECALCIFEROL, 400 UNITS tablet Take 400 Units by mouth daily.  02/01/2014: Unsure of dose.  Thinks she is just taking 1/day  . zolpidem (AMBIEN) 10 MG tablet TAKE 1/2 TO 1 TABLET AT BEDTIME AS NEEDED FOR SLEEP   . lisinopril (PRINIVIL,ZESTRIL) 5 MG tablet Take 1 tablet (5 mg total) by mouth daily. (Patient not taking: Reported on 02/11/2017)    No facility-administered encounter medications on file as of 03/12/2017.     No Known  Allergies   ROS: Denies weight changes, anorexia, dizziness, syncope, cough, swelling, vomiting, diarrhea, constipation, abdominal pain, melena, hematochezia, indigestion/heartburn, hematuria, incontinence, dysuria, vaginal bleeding, discharge, odor or itch, genital lesions,numbness, tingling, weakness, tremor, suspicious skin lesions, depression, abnormal bleeding/bruising, or enlarged lymph nodes.  Bowels are normal, with daily use of miralax. Some bloating in the afternoons Numbness/tingling in both hands resolved s/p CT release surgery (very slight in one hand after doing weights--unchanged). Feet tingling resolved s/p surgery. Back pain is much improved s/p surgery. Hasn't had much neck pain from arthritis recently, improved with yoga.  Intermittent insomnia (see HPI). Some dryness with intercourse, but does well with lubricant, tolerable. Arthritis in her right hands (2nd, 3rd, 4th fingers and thumbs (bilat thumbs)), mostly stiffness in the mornings, and arthritic appearance more than pain. Some recent libido/orgasm issues. DOE and tachycardia improved, now only when out in the heat.   PHYSICAL EXAM:  BP 122/82 (BP Location: Left Arm, Patient Position: Sitting, Cuff Size: Normal)   Pulse 80   Ht 5' 6.5" (1.689 m)   Wt 141 lb 6.4 oz (64.1 kg)   BMI 22.48 kg/m   Wt Readings from Last 3 Encounters:  02/11/17 142 lb 3.2 oz (64.5 kg)  01/21/17 141 lb 3.2 oz (64 kg)  09/01/16 142 lb (64.4 kg)   General Appearance:  Alert, cooperative, no distress, appears stated age or somewhat younger  Head:  Normocephalic, without obvious abnormality, atraumatic   Eyes:  PERRL, conjunctiva/corneas clear, EOM's intact, fundi benign   Ears:  Normal TM's and external ear canals   Nose:  Nares normal, mucosa normal, no drainage or sinus tenderness   Throat:  Lips, mucosa, and tongue normal; teeth and gums normal   Neck:  Supple, no lymphadenopathy; thyroid: no  enlargement/tenderness/nodules; no carotid bruit or JVD   Back:  Spine nontender, no curvature, ROM normal, no CVA tenderness   Lungs:  Clear to auscultation bilaterally without wheezes, rales or ronchi; respirations unlabored   Chest Wall:  No tenderness or deformity   Heart:  Regular rate and rhythm, S1 and S2 normal, no murmur, rub  or gallop   Breast Exam:  No tenderness, masses,  or nipple discharge or inversion. No axillary lymphadenopathy. + implants   Abdomen:  Soft, non-tender, nondistended, normoactive bowel sounds,  no masses, no hepatosplenomegaly   Genitalia:  Normal external genitalia without lesions. Mild atrophic changes noted. BUS and vagina normal; No abnormal vaginal discharge. Uterus and ovaries are surgically absent, nontender, no mass. Pap not performed this year.  Rectal:  Normal tone, no masses or tenderness; heme negative light brown stool   Extremities:  No clubbing, cyanosis or edema.   Pulses:  2+ and symmetric all extremities   Skin:  Skin color, texture, turgor normal, no rashes or lesions   Lymph nodes:  Cervical, supraclavicular, and axillary nodes normal   Neurologic:  CNII-XII intact, normal strength, sensation and gait; reflexes 2+ and symmetric throughout    Psych: Normal mood, affect, hygiene and grooming  Normal diabetic foot exam  Lab Results  Component Value Date   HGBA1C 6.4 (H) 03/04/2017     Chemistry      Component Value Date/Time   NA 138 03/04/2017 0736   NA 139 07/28/2012 1251   K 4.1 03/04/2017 0736   K 5.0 07/28/2012 1251   CL 102 03/04/2017 0736   CL 107 07/28/2012 1251   CO2 25 03/04/2017 0736   CO2 23 07/28/2012 1251   BUN 19 03/04/2017 0736   BUN 19 07/28/2012 1251   CREATININE 0.82 03/04/2017 0736      Component Value Date/Time   CALCIUM 9.3 03/04/2017 0736   CALCIUM 9.4 07/28/2012 1251   ALKPHOS 44 01/21/2017 1338   ALKPHOS 37 07/28/2012 1251    AST 17 01/21/2017 1338   AST 18 07/28/2012 1251   ALT 14 01/21/2017 1338   BILITOT 0.5 01/21/2017 1338   BILITOT 0.4 07/28/2012 1251     Fasting glucose 140  Lab Results  Component Value Date   CHOL 257 (H) 03/04/2017   HDL 130 03/04/2017   LDLCALC 116 (H) 03/04/2017   TRIG 56 03/04/2017   CHOLHDL 2.0 03/04/2017   Urine microalb/Cr normal, ratio 23  Lab Results  Component Value Date   TSH 0.89 01/21/2017   Lab Results  Component Value Date   WBC 7.8 01/21/2017   HGB 13.1 01/21/2017   HCT 38.9 01/21/2017   MCV 94.6 01/21/2017   PLT 266 01/21/2017    ASSESSMENT/PLAN:  Annual physical exam - Plan: POCT Urinalysis Dipstick  Osteopenia, unspecified location - continue alendronate, calcium, D, weight bearing exercise  Type 2 diabetes mellitus without complication, without long-term current use of insulin (HCC) - controlled; continue current regimen  Insomnia, unspecified type  Immunization due - counseled re: risks/side effects; f/u 2 months for 2nd Shingrix - Plan: Varicella-zoster vaccine IM (Shingrix)   Discussed monthly self breast exams and yearly mammograms; at least 30 minutes of aerobic activity at least 5 days/week ,weight bearing exercise at least 2x/wk; proper sunscreen use reviewed; healthy diet, including goals of calcium and vitamin D intake and alcohol recommendations (less than or equal to 1 drink/day) reviewed; regular seatbelt use; changing batteries in smoke detectors. Immunization recommendations discussed--continue yearly flu shots. Shingrix given today (patient verified insurance coverage prior to visit). Colonoscopy recommendations reviewed is scheduled for August.  Please keep a log of your blood pressures and e-mail/fax/mail them to [place] t review.  If your numbers are way off from what we got here, consider making an appointment for a nurse visit to verify the accuracy of your monitor.    F/u 6 mos A1c at visit. [place] Shingrix  in 2 mos

## 2017-03-12 ENCOUNTER — Ambulatory Visit (INDEPENDENT_AMBULATORY_CARE_PROVIDER_SITE_OTHER): Payer: Managed Care, Other (non HMO) | Admitting: Family Medicine

## 2017-03-12 ENCOUNTER — Encounter: Payer: Self-pay | Admitting: Family Medicine

## 2017-03-12 VITALS — BP 122/82 | HR 80 | Ht 66.5 in | Wt 141.4 lb

## 2017-03-12 DIAGNOSIS — E119 Type 2 diabetes mellitus without complications: Secondary | ICD-10-CM | POA: Diagnosis not present

## 2017-03-12 DIAGNOSIS — G4700 Insomnia, unspecified: Secondary | ICD-10-CM | POA: Diagnosis not present

## 2017-03-12 DIAGNOSIS — M8580 Other specified disorders of bone density and structure, unspecified site: Secondary | ICD-10-CM | POA: Diagnosis not present

## 2017-03-12 DIAGNOSIS — Z0000 Encounter for general adult medical examination without abnormal findings: Secondary | ICD-10-CM | POA: Diagnosis not present

## 2017-03-12 DIAGNOSIS — Z23 Encounter for immunization: Secondary | ICD-10-CM | POA: Diagnosis not present

## 2017-03-12 LAB — POCT URINALYSIS DIPSTICK
Bilirubin, UA: NEGATIVE
Blood, UA: NEGATIVE
Glucose, UA: NEGATIVE
Ketones, UA: NEGATIVE
Leukocytes, UA: NEGATIVE
Nitrite, UA: NEGATIVE
Protein, UA: NEGATIVE
Spec Grav, UA: 1.025 (ref 1.010–1.025)
Urobilinogen, UA: NEGATIVE E.U./dL — AB
pH, UA: 5.5 (ref 5.0–8.0)

## 2017-03-12 NOTE — Patient Instructions (Signed)
  HEALTH MAINTENANCE RECOMMENDATIONS:  It is recommended that you get at least 30 minutes of aerobic exercise at least 5 days/week (for weight loss, you may need as much as 60-90 minutes). This can be any activity that gets your heart rate up. This can be divided in 10-15 minute intervals if needed, but try and build up your endurance at least once a week.  Weight bearing exercise is also recommended twice weekly.  Eat a healthy diet with lots of vegetables, fruits and fiber.  "Colorful" foods have a lot of vitamins (ie green vegetables, tomatoes, red peppers, etc).  Limit sweet tea, regular sodas and alcoholic beverages, all of which has a lot of calories and sugar.  Up to 1 alcoholic drink daily may be beneficial for women (unless trying to lose weight, watch sugars).  Drink a lot of water.  Calcium recommendations are 1200-1500 mg daily (1500 mg for postmenopausal women or women without ovaries), and vitamin D 1000 IU daily.  This should be obtained from diet and/or supplements (vitamins), and calcium should not be taken all at once, but in divided doses.  Monthly self breast exams and yearly mammograms for women over the age of 17 is recommended.  Sunscreen of at least SPF 30 should be used on all sun-exposed parts of the skin when outside between the hours of 10 am and 4 pm (not just when at beach or pool, but even with exercise, golf, tennis, and yard work!)  Use a sunscreen that says "broad spectrum" so it covers both UVA and UVB rays, and make sure to reapply every 1-2 hours.  Remember to change the batteries in your smoke detectors when changing your clock times in the spring and fall.  Use your seat belt every time you are in a car, and please drive safely and not be distracted with cell phones and texting while driving.  Please keep a log of your blood pressures and e-mail/fax/mail them to [place] t review.  If your numbers are way off from what we got here, consider making an appointment for a  nurse visit to verify the accuracy of your monitor.

## 2017-03-13 NOTE — Progress Notes (Signed)
LM for pt to CB. /RLB  

## 2017-03-26 LAB — HM MAMMOGRAPHY

## 2017-04-01 ENCOUNTER — Encounter: Payer: Self-pay | Admitting: *Deleted

## 2017-04-01 ENCOUNTER — Encounter: Payer: Self-pay | Admitting: Family Medicine

## 2017-04-22 ENCOUNTER — Ambulatory Visit (AMBULATORY_SURGERY_CENTER): Payer: Self-pay | Admitting: *Deleted

## 2017-04-22 VITALS — Ht 67.0 in | Wt 140.6 lb

## 2017-04-22 DIAGNOSIS — Z1211 Encounter for screening for malignant neoplasm of colon: Secondary | ICD-10-CM

## 2017-04-23 ENCOUNTER — Encounter: Payer: Self-pay | Admitting: Gastroenterology

## 2017-04-23 MED ORDER — NA SULFATE-K SULFATE-MG SULF 17.5-3.13-1.6 GM/177ML PO SOLN: ORAL | 0 refills | Status: DC

## 2017-04-23 NOTE — Progress Notes (Signed)
Pt's PV done on 04-22-17 by B Benitez CMA.  Information entered on 04-23-17 d/t Citrix being down on 04-22-17  Instructions mailed to pt.

## 2017-05-04 ENCOUNTER — Other Ambulatory Visit: Payer: Self-pay | Admitting: Family Medicine

## 2017-05-04 DIAGNOSIS — G4700 Insomnia, unspecified: Secondary | ICD-10-CM

## 2017-05-04 DIAGNOSIS — E119 Type 2 diabetes mellitus without complications: Secondary | ICD-10-CM

## 2017-05-04 NOTE — Telephone Encounter (Signed)
Ok to refill #30

## 2017-05-04 NOTE — Telephone Encounter (Signed)
Please advise on ambien refill. Victorino December

## 2017-05-06 ENCOUNTER — Ambulatory Visit (AMBULATORY_SURGERY_CENTER): Payer: Managed Care, Other (non HMO) | Admitting: Gastroenterology

## 2017-05-06 ENCOUNTER — Encounter: Payer: Self-pay | Admitting: Gastroenterology

## 2017-05-06 VITALS — BP 123/67 | HR 58 | Temp 97.8°F | Resp 16 | Ht 67.0 in | Wt 140.0 lb

## 2017-05-06 DIAGNOSIS — Z1212 Encounter for screening for malignant neoplasm of rectum: Secondary | ICD-10-CM

## 2017-05-06 DIAGNOSIS — Z1211 Encounter for screening for malignant neoplasm of colon: Secondary | ICD-10-CM

## 2017-05-06 MED ORDER — SODIUM CHLORIDE 0.9 % IV SOLN: 500.0000 mL | INTRAVENOUS | Status: DC

## 2017-05-06 NOTE — Progress Notes (Signed)
Pt's states no medical or surgical changes since previsit or office visit. maw 

## 2017-05-06 NOTE — Op Note (Signed)
[facility] Patient Name: Tracey Santiago Procedure Date: 05/06/2017 7:51 AM MRN: [id number] Endoscopist: [place]. Loletha Carrow , MD Age: 61 Referring MD:  Date of Birth: 1955/10/29 Gender: Female Account #: [id number] Procedure:                Colonoscopy Indications:              Screening for colorectal malignant neoplasm Medicines:                Monitored Anesthesia Care Procedure:                Pre-Anesthesia Assessment:                           - Prior to the procedure, a History and Physical                            was performed, and patient medications and                            allergies were reviewed. The patient's tolerance of                            previous anesthesia was also reviewed. The risks                            and benefits of the procedure and the sedation                            options and risks were discussed with the patient.                            All questions were answered, and informed consent                            was obtained. Prior Anticoagulants: The patient has                            taken no previous anticoagulant or antiplatelet                            agents. ASA Grade Assessment: III - A patient with                            severe systemic disease. After reviewing the risks                            and benefits, the patient was deemed in                            satisfactory condition to undergo the procedure.                           After obtaining informed consent, the colonoscope  was passed under direct vision. Throughout the                            procedure, the patient's blood pressure, pulse, and                            oxygen saturations were monitored continuously. The                            Colonoscope was introduced through the anus and                            advanced to the the cecum, identified by                            appendiceal orifice  and ileocecal valve. The                            colonoscopy was performed without difficulty. The                            patient tolerated the procedure well. The quality                            of the bowel preparation was excellent. The                            ileocecal valve, appendiceal orifice, and rectum                            were photographed. The quality of the bowel                            preparation was evaluated using the BBPS [facility]                            Bowel Preparation Scale) with scores of: Right                            Colon = 3, Transverse Colon = 3 and Left Colon = 3                            (entire mucosa seen well with no residual staining,                            small fragments of stool or opaque liquid). The                            total BBPS score equals 9. The bowel preparation                            used was SUPREP. Scope In: 8:02:01 AM Scope Out: 8:16:29 AM Scope Withdrawal Time: 0 hours 10  minutes 3 seconds  Total Procedure Duration: 0 hours 14 minutes 28 seconds  Findings:                 The perianal and digital rectal examinations were                            normal.                           The entire examined colon appeared normal on direct                            and retroflexion views. Complications:            No immediate complications. Estimated Blood Loss:     Estimated blood loss: none. Impression:               - The entire examined colon is normal on direct and                            retroflexion views.                           - No specimens collected. Recommendation:           - Patient has a contact number available for                            emergencies. The signs and symptoms of potential                            delayed complications were discussed with the                            patient. Return to normal activities tomorrow.                            Written discharge  instructions were provided to the                            patient.                           - Resume previous diet.                           - Continue present medications.                           - Repeat colonoscopy in 10 years for screening                            purposes. Kita Neace L. Loletha Carrow, MD 05/06/2017 8:19:09 AM This report has been signed electronically.

## 2017-05-06 NOTE — Patient Instructions (Signed)
YOU HAD AN ENDOSCOPIC PROCEDURE TODAY AT THE [facility]:   Refer to the procedure report that was given to you for any specific questions about what was found during the examination.  If the procedure report does not answer your questions, please call your gastroenterologist to clarify.  If you requested that your care partner not be given the details of your procedure findings, then the procedure report has been included in a sealed envelope for you to review at your convenience later.  YOU SHOULD EXPECT: Some feelings of bloating in the abdomen. Passage of more gas than usual.  Walking can help get rid of the air that was put into your GI tract during the procedure and reduce the bloating. If you had a lower endoscopy (such as a colonoscopy or flexible sigmoidoscopy) you may notice spotting of blood in your stool or on the toilet paper. If you underwent a bowel prep for your procedure, you may not have a normal bowel movement for a few days.  Please Note:  You might notice some irritation and congestion in your nose or some drainage.  This is from the oxygen used during your procedure.  There is no need for concern and it should clear up in a day or so.  SYMPTOMS TO REPORT IMMEDIATELY:   Following lower endoscopy (colonoscopy or flexible sigmoidoscopy):  Excessive amounts of blood in the stool  Significant tenderness or worsening of abdominal pains  Swelling of the abdomen that is new, acute  Fever of 100F or higher  For urgent or emergent issues, a gastroenterologist can be reached at any hour by calling [phone].   DIET:  We do recommend a small meal at first, but then you may proceed to your regular diet.  Drink plenty of fluids but you should avoid alcoholic beverages for 24 hours.  MEDICATIONS:  Continue present medications.  ACTIVITY:  You should plan to take it easy for the rest of today and you should NOT DRIVE or use heavy machinery until tomorrow (because of the  sedation medicines used during the test).    FOLLOW UP: Our staff will call the number listed on your records the next business day following your procedure to check on you and address any questions or concerns that you may have regarding the information given to you following your procedure. If we do not reach you, we will leave a message.  However, if you are feeling well and you are not experiencing any problems, there is no need to return our call.  We will assume that you have returned to your regular daily activities without incident.  If any biopsies were taken you will be contacted by phone or by letter within the next 1-3 weeks.  Please call us at [phone] if you have not heard about the biopsies in 3 weeks.   Thank you for allowing us to provide for your healthcare needs today.   SIGNATURES/CONFIDENTIALITY: You and/or your care partner have signed paperwork which will be entered into your electronic medical record.  These signatures attest to the fact that that the information above on your After Visit Summary has been reviewed and is understood.  Full responsibility of the confidentiality of this discharge information lies with you and/or your care-partner. 

## 2017-05-06 NOTE — Progress Notes (Signed)
A and O x3. Report to RN. Tolerated MAC anesthesia well.

## 2017-05-07 ENCOUNTER — Telehealth: Payer: Self-pay | Admitting: *Deleted

## 2017-05-07 NOTE — Telephone Encounter (Signed)
  Follow up Call-  Call back number 05/06/2017  Post procedure Call Back phone  # [phone] cell  Permission to leave phone message Yes  Some recent data might be hidden     Patient questions:  Do you have a fever, pain , or abdominal swelling? No. Pain Score  0 *  Have you tolerated food without any problems? Yes.    Have you been able to return to your normal activities? Yes.    Do you have any questions about your discharge instructions: Diet   No. Medications  No. Follow up visit  No.  Do you have questions or concerns about your Care? No.  Actions: * If pain score is 4 or above: No action needed, pain <4.

## 2017-06-09 ENCOUNTER — Other Ambulatory Visit (INDEPENDENT_AMBULATORY_CARE_PROVIDER_SITE_OTHER): Payer: Managed Care, Other (non HMO)

## 2017-06-09 DIAGNOSIS — Z23 Encounter for immunization: Secondary | ICD-10-CM | POA: Diagnosis not present

## 2017-07-24 ENCOUNTER — Other Ambulatory Visit: Payer: Self-pay | Admitting: Family Medicine

## 2017-07-24 DIAGNOSIS — M8580 Other specified disorders of bone density and structure, unspecified site: Secondary | ICD-10-CM

## 2017-08-04 ENCOUNTER — Other Ambulatory Visit: Payer: Self-pay | Admitting: Family Medicine

## 2017-08-04 DIAGNOSIS — E119 Type 2 diabetes mellitus without complications: Secondary | ICD-10-CM

## 2017-09-14 ENCOUNTER — Encounter: Payer: Managed Care, Other (non HMO) | Admitting: Family Medicine

## 2017-09-14 NOTE — Progress Notes (Signed)
Chief Complaint  Patient presents with  . Hyperlipidemia    nonfasting med check. Has some questions on vaccines for trip to [place] in May-if you cannot answer today she has no problem setting up travel consult.     Diabetes:  Diagnosed 07/2015 after 2 fasting sugars in the 140's-150's (A1c 6.2). She is tolerating Metforminwithout side effects.  Lab Results  Component Value Date   HGBA1C 6.4 (H) 03/04/2017  Due for recheck today.  She has been very good regarding her diet, exercise.She doesn't drink sugary drinks, and watches her sweets. She has been to some diabetes education with her mother, and feels like she knows what she is supposed to eat; she has declined nutrition consult when offered in the past. 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  She saw eye doctor in late September or October, diagnosed with narrow angle glaucoma.  She was sent to a specialist; she is scheduled for additional testing this month, and will also have cataract surgery.  She denies any retinopathy.  She previously saw Dr. Amedeo Plenty, surgery not recommended since not very painful.  Her BP was elevated last spring, so lisinopril was started, but this was stopped when creatinine bumped up to 1.6.  BP's have been running 110-120's/70. Last creatinine was back to normal, in June, at 0.82  Shortness of breath and palpitations have improved. She hasn't had this happen since being off the blood pressure medication.  Lipids were excellent in the past, with very high HDL, so not put on statin (cardiologist agrees with this recommendation). She has been compliant with taking her aspirin daily. Denies any side effects. She bruises easily, but denies any change.  Anxiety and insomnia: Anxiety related to worrying about whether or not she can sleep, which is alleviated by having ambien around just in case. She has been using [place] as needed, and has no side effects  or unusual behaviors. Only needs to take 1/2 tablet with good results, about 2-3x week.  Stressors have improved since last visit. Son (with opioid addiction) went to rehab, doing better since living in [place]. He recently had pancreatitis, but is still doing well, sober. Daughter's health has been good (has MS). Granddaughter is healthy.  Osteopenia: She has been taking alendronate since September 2016. She had slight heartburn when she first started it, but that has resolved. She is also taking a Bone Strength "natural" calcium. Vitamin D level was normal on last check. She gets regular weight-bearing exercise. Denies any side effects or dysphagia.  She had f/u DEXA done 11/2016 (I had recommended checking in September, 2 years from when alendronate was started, but it was done 2 years from last scan instead).  T -2.1 on last scan.  She had her colonoscopy in August, which was normal.  Having some pain in her neck and shoulders, woke up with it one day before Thanksgiving. She has been seeing Almira Coaster for physical therapy, just 3-4 times so far. Denies any radiation of pain or weakness. No numbness/tingling.  She is going to [place] in May.  She does not have the details of the locations, thinks she will be in cities and resorts, but may be going into small villages for shopping.   PMH, PSH, SH reviewed  Outpatient Encounter Medications as of 09/16/2017  Medication Sig Note  . alendronate (FOSAMAX) 70 MG tablet TAKE ONE TABLET EVERY 7 DAYS WITH FULL GLASS OF WATER ON AN EMPTY STOMACH   .  aspirin EC 81 MG tablet Take 1 tablet (81 mg total) by mouth daily.   Marland Kitchen BORON PO Take 1 capsule by mouth daily. 02/18/2016: Takes nightly to help her resorb the bone vitamin  . MAGNESIUM PO Take 2 tablets by mouth daily.    . metFORMIN (GLUCOPHAGE-XR) 500 MG 24 hr tablet TAKE ONE TABLET DAILY WITH BREAKFAST   . NON FORMULARY Take 3 capsules by mouth daily. 08/15/2015: BONE STRENGTH SUPPLEMENT (orders  online)  . polyethylene glycol (MIRALAX / GLYCOLAX) packet Take 17 g by mouth 2 (two) times daily. 02/07/2015: Takes nightly  . TURMERIC PO Take 1 tablet by mouth daily.   . vitamin D, CHOLECALCIFEROL, 400 UNITS tablet Take 400 Units by mouth daily.  02/01/2014: Unsure of dose.  Thinks she is just taking 1/day  . zolpidem (AMBIEN) 10 MG tablet TAKE 1/2 TO 1 TABLET AT BEDTIME AS NEEDED FOR SLEEP   . [DISCONTINUED] zolpidem (AMBIEN) 10 MG tablet TAKE 1/2 TO 1 TABLET AT BEDTIME AS NEEDED FOR SLEEP   . OMEGA 3 1200 MG CAPS Take 2,400 mg by mouth daily. Reported on 02/18/2016   . typhoid (VIVOTIF) DR capsule Take 1 capsule by mouth every other day.    Facility-Administered Encounter Medications as of 09/16/2017  Medication  . 0.9 %  sodium chloride infusion   No Known Allergies  ROS: no fever, chills, headaches, dizziness, chest pain, shortness of breath, edema, URI symptoms, urinary complaints.  Neck pain per HPI. Moods are good. No other concerns.   PHYSICAL EXAM:  BP 126/76   Pulse 68   Ht 5\' 7"  (1.702 m)   Wt 140 lb 12.8 oz (63.9 kg)   BMI 22.05 kg/m   Wt Readings from Last 3 Encounters:  09/16/17 140 lb 12.8 oz (63.9 kg)  05/06/17 140 lb (63.5 kg)  04/23/17 140 lb 9.6 oz (63.8 kg)    Well developed, pleasant female in no distress HEENT: conjunctiva and sclera are clear, EOMI. OP is clear Neck: no lymphadenopathy, thyromegaly or carotid bruit. No spinal tenderness.  Area of discomfort is at trapezius muscles bilaterally, but not tender, no spasm currently. Heart: regular rate and rhythm Lungs: clear bilaterally Back: no CVA or spinal tenderness Abdomen: soft, nontender, no organomegaly or mass Extremities: no edema, normal pulses. Arthritic bony abnormalities in both hands. Psych: normal mood, affect, hygiene and grooming Neuro: alert and oriented, cranial nerves intact, normal gait  Lab Results  Component Value Date   HGBA1C 6.5 09/16/2017     ASSESSMENT/PLAN:   Type 2  diabetes mellitus without complication, without long-term current use of insulin ([place]) - well controlled, continue metformin - Plan: HgB A1c  Osteopenia, unspecified location - continue alendronate and recheck DEXA 11/2018  Travel advice encounter - Hep A #1 and Vivotif - Plan: typhoid (VIVOTIF) DR capsule  Insomnia - continue prn use of ambien - Plan: zolpidem (AMBIEN) 10 MG tablet  Need for hepatitis A immunization - Plan: Hepatitis A vaccine adult IM   Continue alendronate.  Repeat DEXA 11/2018.  Hepatitis A #1 today. Reviewed how/when to take Vivotif. Given info re: CBC website for travelers   You were given your first hepatitis A vaccine today. Return in 6 months for a second (and final) vaccine.  F/u 6 months for CPE; to get Hep A#2 at that visit.

## 2017-09-16 ENCOUNTER — Ambulatory Visit: Payer: Managed Care, Other (non HMO) | Admitting: Family Medicine

## 2017-09-16 ENCOUNTER — Encounter: Payer: Self-pay | Admitting: Family Medicine

## 2017-09-16 VITALS — BP 126/76 | HR 68 | Ht 67.0 in | Wt 140.8 lb

## 2017-09-16 DIAGNOSIS — Z7189 Other specified counseling: Secondary | ICD-10-CM | POA: Diagnosis not present

## 2017-09-16 DIAGNOSIS — Z23 Encounter for immunization: Secondary | ICD-10-CM | POA: Diagnosis not present

## 2017-09-16 DIAGNOSIS — M8580 Other specified disorders of bone density and structure, unspecified site: Secondary | ICD-10-CM | POA: Diagnosis not present

## 2017-09-16 DIAGNOSIS — G4700 Insomnia, unspecified: Secondary | ICD-10-CM

## 2017-09-16 DIAGNOSIS — Z7184 Encounter for health counseling related to travel: Secondary | ICD-10-CM

## 2017-09-16 DIAGNOSIS — E119 Type 2 diabetes mellitus without complications: Secondary | ICD-10-CM

## 2017-09-16 LAB — POCT GLYCOSYLATED HEMOGLOBIN (HGB A1C): Hemoglobin A1C: 6.5

## 2017-09-16 MED ORDER — ZOLPIDEM TARTRATE 10 MG PO TABS: ORAL_TABLET | ORAL | 0 refills | Status: DC

## 2017-09-16 MED ORDER — TYPHOID VACCINE PO CPDR: 1.0000 | DELAYED_RELEASE_CAPSULE | ORAL | 0 refills | Status: DC

## 2017-09-16 NOTE — Patient Instructions (Addendum)
Continue your current medications.  Your diabetes remains well controlled on the metformin.   You were given your first hepatitis A vaccine today. Return in 6 months for a second (and final) vaccine.  We can actually just give this at your physical next summer.  The vivotif is the oral typhoid vaccine.  It needs to be completed at least a week prior to leaving on your trip (it is 4 capsules, taken every other day). Take an hour prior to food, and not with hot beverages.   Have a great trip!

## 2017-09-23 ENCOUNTER — Other Ambulatory Visit: Payer: Self-pay | Admitting: Family Medicine

## 2017-09-23 DIAGNOSIS — M8580 Other specified disorders of bone density and structure, unspecified site: Secondary | ICD-10-CM

## 2017-10-27 ENCOUNTER — Other Ambulatory Visit: Payer: Self-pay | Admitting: Family Medicine

## 2017-10-27 DIAGNOSIS — E119 Type 2 diabetes mellitus without complications: Secondary | ICD-10-CM

## 2017-12-09 ENCOUNTER — Ambulatory Visit: Payer: Managed Care, Other (non HMO) | Admitting: Family Medicine

## 2017-12-09 ENCOUNTER — Telehealth: Payer: Self-pay | Admitting: *Deleted

## 2017-12-09 ENCOUNTER — Encounter: Payer: Self-pay | Admitting: Family Medicine

## 2017-12-09 VITALS — BP 130/82 | HR 68 | Temp 98.5°F | Ht 67.0 in | Wt 142.0 lb

## 2017-12-09 DIAGNOSIS — R590 Localized enlarged lymph nodes: Secondary | ICD-10-CM

## 2017-12-09 NOTE — Patient Instructions (Signed)
We are setting you up for diagnostic mammogram/ultrasound of the right breast at [facility] to evaluate the lymph node that we feel.

## 2017-12-09 NOTE — Telephone Encounter (Signed)
Patient called and she found a lump under her right arm, called [facility] and tried to schedule a diagnostic mammo. They said they needed approval from her PCP and they would be glad to schedule her for Friday. I told her that I would ask but she might need to be seen first, please advise.

## 2017-12-09 NOTE — Telephone Encounter (Signed)
Needs to be seen. Can see her 2:30 today or tomorrow

## 2017-12-09 NOTE — Progress Notes (Signed)
Chief Complaint  Patient presents with  . Mass    under right arm a few weeks ago and dismissed it and then noticed it again today so wanted to make sure.    She first noted a lump in her right armpit a few weeks ago--thought she felt something, then she couldn't. While bathing this morning, she felt the lump again. Not painful. No breast concerns or changes. No problems on the left. She has bilateral saline implants.  Recalls having some discomfort in the breasts a while ago after PT for her neck, not recently.  Denies any arm rashes, infections or other complaints.  Last mammogram was in July through [place].  PMH, PSH, SH reviewed  Outpatient Encounter Medications as of 12/09/2017  Medication Sig Note  . alendronate (FOSAMAX) 70 MG tablet TAKE ONE TABLET EVERY 7 DAYS WITH FULL GLASS OF WATER ON AN EMPTY STOMACH   . aspirin EC 81 MG tablet Take 1 tablet (81 mg total) by mouth daily.   Marland Kitchen BORON PO Take 1 capsule by mouth daily. 02/18/2016: Takes nightly to help her resorb the bone vitamin  . MAGNESIUM PO Take 2 tablets by mouth daily.    . metFORMIN (GLUCOPHAGE-XR) 500 MG 24 hr tablet TAKE ONE TABLET DAILY WITH BREAKFAST   . NON FORMULARY Take 3 capsules by mouth daily. 08/15/2015: BONE STRENGTH SUPPLEMENT (orders online)  . OMEGA 3 1200 MG CAPS Take 2,400 mg by mouth daily. Reported on 02/18/2016   . polyethylene glycol (MIRALAX / GLYCOLAX) packet Take 17 g by mouth 2 (two) times daily. 02/07/2015: Takes nightly  . TURMERIC PO Take 1 tablet by mouth daily.   . vitamin D, CHOLECALCIFEROL, 400 UNITS tablet Take 400 Units by mouth daily.  02/01/2014: Unsure of dose.  Thinks she is just taking 1/day  . zolpidem (AMBIEN) 10 MG tablet TAKE 1/2 TO 1 TABLET AT BEDTIME AS NEEDED FOR SLEEP   . typhoid (VIVOTIF) DR capsule Take 1 capsule by mouth every other day. (Patient not taking: Reported on 12/09/2017)    Facility-Administered Encounter Medications as of 12/09/2017  Medication  . 0.9 %  sodium  chloride infusion   No Known Allergies  ROS: no fever, chills, URI complaints, rashes.  Neck pain is better.  See HPI  PHYSICAL EXAM:  BP 130/82   Pulse 68   Temp 98.5 F (36.9 C) (Tympanic)   Ht 5\' 7"  (1.702 m)   Wt 142 lb (64.4 kg)   BMI 22.24 kg/m   Well appearing, pleasant female in no distress Bilateral implants are present. No nipple discharge, inversion, skin dimpling or dominant masses. There is some crinkling/different texture at the lateral edge of the right implant, and just deep to this there is a pea-sized, mobile, nontender mass, which feels like a benign lymph node. Left breast exam is normal, no axillary lymphadenopathy   ASSESSMENT/PLAN:  Axillary lymphadenopathy - right; normal breast exam, with implants. Eval with mammo and [place]   [facility]-- Diagnostic mammogram and US of the right breast and axilla Pea-sized mobile, nontender soft mass; suspect benign LN, but fully evaluate to r/o malignancy or concern.

## 2017-12-10 LAB — HM MAMMOGRAPHY

## 2018-02-09 HISTORY — PX: BASAL CELL CARCINOMA EXCISION: SHX1214

## 2018-02-22 ENCOUNTER — Encounter: Payer: Self-pay | Admitting: Family Medicine

## 2018-03-23 NOTE — Progress Notes (Signed)
Chief Complaint  Patient presents with  . Annual Exam    fasting (blood in lab) with pap or pelvic-not sure which one she is due for. No concerns.     Tracey Santiago is a 62 y.o. female who presents for a complete physical.    She had BCC removed by Dr. Ubaldo Glassing from her upper back in May. Slightly sore, but healing.  Seen 3/27 with lump in right axilla.  Recommended diagnostic mammogram and US of the right breast and axilla (from [place], no results were received). Was told it was benign, due for another check now as part of her bilateral exam due this month.  Denies any significant change, nontender.  Diabetes: Diagnosed 07/2015 after 2 fasting sugars in the 140's-150's (A1c 6.2). She is tolerating Metforminwithout side effects.  Lab Results  Component Value Date   HGBA1C 6.5 09/16/2017   She continues to avoid sugary drinks, watches her sweets, and exercises regularly. She has been to some diabetes education with her mother in the past, declined nutrition consult for herself. 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  She saw eye doctor in late September or October, diagnosed with narrow angle glaucoma. She saw a glaucoma/cataract specialist, and had cataract surgery bilaterally (in Feb and March).  Removing the cataracts took care of the problem (even the glaucoma)--no longer needs glasses or contacts. Pressures have all been normal.  Recently snacking on Yasso greek frozen yogurt bars (low calorie).  She previously saw Dr. Amedeo Plenty for her hand/finger arthritis, surgery not recommended since not very painful. Has some limitation of function (struggles with buckling car seat) and stiffness, but no significant pain.  Her BPwas elevated last year, so lisinopril was started, but this was stopped when creatinine bumped up to 1.6. BP's have been running 120's-130/70. Last creatinine was back to normal, in June, at 0.82.  Urine  microalb/Cr ratio was normal in 02/2017, due for recheck today.  Shortness of breath and palpitations have improved--reported this while taking lisinopril.  Lipids were excellentin the past,withvery high HDL, so notput onstatin (cardiologist agrees with this recommendation).She has been compliant with taking her aspirin daily.Denies any side effects. She bruises easily, but denies any change. Lab Results  Component Value Date   CHOL 257 (H) 03/04/2017   HDL 130 03/04/2017   LDLCALC 116 (H) 03/04/2017   TRIG 56 03/04/2017   CHOLHDL 2.0 03/04/2017    Anxiety and insomnia: Anxiety related to worrying about whether or not she can sleep, which is alleviated by having ambien around just in case. She has been using [place] as needed, and has no side effects or unusual behaviors. Only needs to take 1/2 tablet with good results, about 2-3x week. She has some travel anxiety (went to [facility] bus trips with the cliffs made her very anxious).  Denies significant stress--son (with opioid addiction) went to rehab, doing better since living in [place], still doing well, sober. Daughter's health has been good (has MS). Granddaughter is healthy.  Osteopenia: She has been taking alendronate since September 2016. She had slight heartburn when she first started it, but that has resolved. She is also taking a Bone Strength "natural" calcium. Vitamin D level was normal on last check (2016). She gets regular weight-bearing exercise. Denies any side effects or dysphagia.  She had f/u DEXA done 11/2016 (I had recommended checking in September, 2 years from when alendronate was started, but it was done 2 years from last scan instead).  T -2.1 on last scan.  Pain in neck and shoulders which started before Thanksgiving resolved with PT with Almira Coaster. Doing home regimen and neck is doing "great".   Immunization History  Administered Date(s) Administered  . Hepatitis A, Adult 09/16/2017  .  Influenza Split 07/29/2012, 08/15/2013  . Influenza,inj,Quad PF,6+ Mos 08/15/2015, 07/09/2016, 06/09/2017  . Pneumococcal Conjugate-13 02/18/2016  . Tdap 09/15/2008  . Zoster 02/07/2015  . Zoster Recombinat (Shingrix) 03/12/2017, 06/09/2017   Last Pap smear: 01/2015 of vaginal cuff--normal with no high risk HPV Last mammogram: 03/2017. (right mammo and [place] 11/2017) Last colonoscopy: 04/2017 with Dr. Loletha Carrow, normal. Repeat 10 years. Last DEXA: 11/2016-- T-2.1 L fem neck, stable (I had wanted this done 05/2017, 2 years from when meds started, but was done when [facility] told her, 2 years from last test). Dentist: twice yearly  Ophtho: yearly  Exercise: She is active in the yard, walking the dogs. Upper body strength exercises 5x/week. Does yoga regularly (intensified the level). She walks in at least 20 minute intervals throughout the day, 7 days/week. Gets 15K steps daily.  Past Medical History:  Diagnosis Date  . Anemia   . Anxiety    "mild"  . Arthritis    spine, right hand, hip  . Carpal tunnel syndrome, bilateral    no problems s/p surgery bilaterally  . Cervical cancer ([place]) 2005  . DDD (degenerative disc disease), cervical    C4-5, C5-6, some oseophytosis, foraminal narrowing C3-4, C4-5  . Diabetes mellitus without complication ([place])   . Hyperlipidemia    mild  . Hypertension   . Impaired fasting glucose    DM diagnosed 07/2015 for 2 fasting glucose >126  . 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 Post-operative nausea and vomiting     Past Surgical History:  Procedure Laterality Date  . ABDOMINAL HYSTERECTOMY  05/28/2004   pelvic lymphadenectomy, ovarian transposition, suprapubic cath. placement  . BASAL CELL CARCINOMA EXCISION  02/09/2018   basal cell carinoma, superficial an nodular patterns  . BREAST SURGERY  1987   breast augmentation, implants replaced 11/2008  . CARPAL TUNNEL RELEASE  11/07/2011   Procedure: CARPAL TUNNEL RELEASE;  Surgeon: Cammie Sickle.,  MD;  Location: [facility];  Service: Orthopedics;  Laterality: Left;  . CARPAL TUNNEL RELEASE  04/08/2012   Procedure: CARPAL TUNNEL RELEASE;  Surgeon: Cammie Sickle., MD;  Location: [facility];  Service: Orthopedics;  Laterality: Right;  . CATARACT EXTRACTION, BILATERAL  Feb and March 2019   Dr. Anne Shutter  . CESAREAN SECTION    . childbirth     x3- 2 NVD, 1 c-section  . HAMMER TOE SURGERY Left 06/2012  . POSTERIOR LAMINECTOMY / DECOMPRESSION LUMBAR SPINE  07/21/2011   arthrodesis L4-5 (Dr. Ellene Route)  . TONSILLECTOMY    . TOTAL HIP ARTHROPLASTY Right 12/13/2013   Procedure: RIGHT TOTAL HIP ARTHROPLASTY ANTERIOR APPROACH;  Surgeon: Mauri Pole, MD;  Location: [facility];  Service: Orthopedics;  Laterality: Right;    Social History   Socioeconomic History  . Marital status: Married    Spouse name: Not on file  . Number of children: 3  . Years of education: Not on file  . Highest education level: Not on file  Occupational History  . Occupation: Works at [employer]  Social Needs  . Financial resource strain: Not on file  . Food insecurity:    Worry: Not on  file    Inability: Not on file  . Transportation needs:    Medical: Not on file    Non-medical: Not on file  Tobacco Use  . Smoking status: Former Smoker    Last attempt to quit: 07/13/1996    Years since quitting: 21.7  . Smokeless tobacco: Never Used  Substance and Sexual Activity  . Alcohol use: Yes    Alcohol/week: 0.0 oz    Comment: 5-6 drinks/week  . Drug use: No  . Sexual activity: Yes    Partners: Male    Birth control/protection: Surgical  Lifestyle  . Physical activity:    Days per week: Not on file    Minutes per session: Not on file  . Stress: Not on file  Relationships  . Social connections:    Talks on phone: Not on file    Gets together: Not on file    Attends religious service: Not on file    Active member of club or  organization: Not on file    Attends meetings of clubs or organizations: Not on file    Relationship status: Not on file  . Intimate partner violence:    Fear of current or ex partner: Not on file    Emotionally abused: Not on file    Physically abused: Not on file    Forced sexual activity: Not on file  Other Topics Concern  . Not on file  Social History Narrative   Lives with husband.  Daughter and son in [place],  1 son in [place] (and travels a lot to [place]).    Son with opioid addiction (why he moved back to [facility])   Daughter diagnosed with MS 2017.  Has 1 granddaughter ([place])    Family History  Problem Relation Age of Onset  . Osteoporosis Mother   . Arthritis Mother   . Diabetes Mother   . Hearing loss Mother   . Dementia Mother   . Hypertension Father   . Cancer Father   . Heart disease Father 42       CABG  . Peripheral vascular disease Father        s/p bypasses  . Lung cancer Father   . Alcohol abuse Sister        recovered  . Urolithiasis Sister   . Multiple sclerosis Daughter 55  . Drug abuse Son        opioid addiction  . Heart disease Maternal Grandfather   . Colon cancer Neg Hx   . Esophageal cancer Neg Hx   . Rectal cancer Neg Hx   . Stomach cancer Neg Hx   . Pancreatic cancer Neg Hx     Outpatient Encounter Medications as of 03/24/2018  Medication Sig Note  . alendronate (FOSAMAX) 70 MG tablet TAKE ONE TABLET EVERY 7 DAYS WITH FULL GLASS OF WATER ON AN EMPTY STOMACH   . aspirin EC 81 MG tablet Take 1 tablet (81 mg total) by mouth daily.   Marland Kitchen BORON PO Take 1 capsule by mouth daily. 02/18/2016: Takes nightly to help her resorb the bone vitamin  . MAGNESIUM PO Take 2 tablets by mouth daily.    . metFORMIN (GLUCOPHAGE-XR) 500 MG 24 hr tablet TAKE ONE TABLET DAILY WITH BREAKFAST   . NON FORMULARY Take 3 capsules by mouth daily. 08/15/2015: BONE STRENGTH SUPPLEMENT (orders online)  . OMEGA 3 1200 MG CAPS Take 2,400 mg by mouth daily. Reported on 02/18/2016    . polyethylene glycol (MIRALAX / GLYCOLAX)  packet Take 17 g by mouth 2 (two) times daily. 02/07/2015: Takes nightly  . TURMERIC PO Take 1 tablet by mouth daily.   . vitamin D, CHOLECALCIFEROL, 400 UNITS tablet Take 400 Units by mouth daily.  02/01/2014: Unsure of dose.  Thinks she is just taking 1/day  . zolpidem (AMBIEN) 10 MG tablet TAKE 1/2 TO 1 TABLET AT BEDTIME AS NEEDED FOR SLEEP   . [DISCONTINUED] typhoid (VIVOTIF) DR capsule Take 1 capsule by mouth every other day. (Patient not taking: Reported on 12/09/2017)    Facility-Administered Encounter Medications as of 03/24/2018  Medication  . 0.9 %  sodium chloride infusion    No Known Allergies  ROS: Denies weight changes, anorexia, dizziness, syncope, cough, swelling, vomiting, diarrhea, constipation, abdominal pain, melena, hematochezia, indigestion/heartburn, hematuria, incontinence, dysuria, vaginal bleeding, discharge, odor or itch, genital lesions,numbness, tingling, weakness, tremor, suspicious skin lesions, depression, abnormal bleeding/bruising, or enlarged lymph nodes.  Bowels are normal, with daily use of miralax. Some bloating in the afternoons. Hasn't had much neck pain from arthritis recently, improved with yoga, PT and is still doing home exercises. Intermittent insomnia, per HPI. Some dryness with intercourse, but does well with lubricant, tolerable. Arthritis in her right hand (2nd, 3rd, 4th fingers and thumbs (bilat thumbs)), mostly stiffness in the mornings, and arthritic appearance more than pain. DOE and tachycardia only when out in the heat. Nausea after returning from [place], resolved. Never had fever, diarrhea, pain or other symptoms, just nausea.   PHYSICAL EXAM:  BP 130/80   Pulse 76   Ht _0  (1.702 m)   Wt 142 lb 9.6 oz (64.7 kg)   BMI 22.33 kg/m   Wt Readings from Last 3 Encounters:  03/24/18 142 lb 9.6 oz (64.7 kg)  12/09/17 142 lb (64.4 kg)  09/16/17 140 lb 12.8 oz (63.9 kg)   BP recheck  124/78  General Appearance:  Alert, cooperative, no distress, appears stated age or somewhat younger  Head:  Normocephalic, without obvious abnormality, atraumatic   Eyes:  PERRL, conjunctiva/corneas clear, EOM's intact, fundi benign   Ears:  Normal TM's and external ear canals   Nose:  Nares normal, mucosa normal, no drainage or sinus tenderness   Throat:  Lips, mucosa, and tongue normal; teeth and gums normal   Neck:  Supple, no lymphadenopathy; thyroid: no enlargement/ tenderness/nodules; no carotid bruit or JVD   Back:  Spine nontender, no curvature, ROM normal, no CVA tenderness   Lungs:  Clear to auscultation bilaterally without wheezes, rales or ronchi; respirations unlabored   Chest Wall:  No tenderness or deformity   Heart:  Regular rate and rhythm, S1 and S2 normal, no murmur, rub  or gallop   Breast Exam:  No tenderness, masses, or nipple discharge or inversion. No axillary lymphadenopathy. + implants, with some "crackling" sensation with palpation over the lateral/UOP aspects. No longer can feel the pea-sized area of concern that was noted in March.    Abdomen:  Soft, non-tender, nondistended, normoactive bowel sounds,  no masses, no hepatosplenomegaly   Genitalia:  Normal external genitalia without lesions. Mild atrophic changes noted. BUS and vagina normal; No abnormal vaginal discharge. Uterus and ovaries are surgically absent, nontender, no mass. Pap not performed.  Rectal:  Normal tone, no masses or tenderness; heme negative light brown stool   Extremities:  No clubbing, cyanosis or edema. Slight hammertoe of 2nd toe on right  Pulses:  2+ and symmetric all extremities   Skin:  Skin color, texture, turgor normal, no rashes  or lesions. Macular area of erythema in her central upper back (healing shave biopsy)  Lymph nodes:  Cervical, supraclavicular, and axillary nodes normal   Neurologic:  CNII-XII intact,  normal strength, sensation and gait; reflexes 2+ and symmetric throughout    Psych: Normal mood, affect, hygiene and grooming  Normal diabetic foot exam  Lab Results  Component Value Date   HGBA1C 6.7 (A) 03/24/2018     ASSESSMENT/PLAN:  Annual physical exam - Plan: POCT Urinalysis DIP (Proadvantage Device), CBC with Differential/Platelet, TSH, Comprehensive metabolic panel, Lipid panel, Microalbumin / creatinine urine ratio  Type 2 diabetes mellitus without complication, without long-term current use of insulin (HCC) - Continue metformin. If A1c continues to rise, will increase dose to 1070m.Continue proper diet, exercise - Plan: HgB A1c, TSH, Lipid panel, Microalbumin / creatinine urine ratio  Immunization due - risks/SE of TdaP reviewed - Plan: Tdap vaccine greater than or equal to 7yo IM  Need for hepatitis A immunization - #2, to complete the series today. Had not issues from the first injection - Plan: Hepatitis A vaccine adult IM  Medication monitoring encounter  Osteopenia - cont alendronate, Ca, D, weight-bearing exercises - Plan: alendronate (FOSAMAX) 70 MG tablet   Hep A #2 TdaP (vs Td) A1c Urine microalb/Cr, c-met, CBC, TSH, lipids  Get diabetic eye results from 9-06/2017  Discussed monthly self breast exams and yearly mammograms (due now); at least 30 minutes of aerobic activity at least 5 days/week ,weight bearing exercise at least 2x/wk; proper sunscreen use reviewed; healthy diet, including goals of calcium and vitamin D intake and alcohol recommendations (less than or equal to 1 drink/day) reviewed; regular seatbelt use; changing batteries in smoke detectors, carbon monoxide detector recommended. Immunization recommendations discussed--continue yearly flu shots. Tetanus booster and 2nd Hepatitis A vaccine given today. Pneumovax age 62  Colonoscopy UTD, due again 04/2027.  Check the sugar content in your snacks (ie Yasso bars). Continue  healthy diet, regular exercise. Continue 1 of the metformin daily. If your A1c continues to creep up at next visit, we can increase the dose.  A1c under 7 is still controlled. (6.7 today).  F/u 6 mos, A1c at visit, sooner prn.

## 2018-03-24 ENCOUNTER — Encounter: Payer: Self-pay | Admitting: Family Medicine

## 2018-03-24 ENCOUNTER — Ambulatory Visit: Payer: Managed Care, Other (non HMO) | Admitting: Family Medicine

## 2018-03-24 VITALS — BP 124/78 | HR 76 | Ht 67.0 in | Wt 142.6 lb

## 2018-03-24 DIAGNOSIS — Z23 Encounter for immunization: Secondary | ICD-10-CM | POA: Diagnosis not present

## 2018-03-24 DIAGNOSIS — M8580 Other specified disorders of bone density and structure, unspecified site: Secondary | ICD-10-CM | POA: Diagnosis not present

## 2018-03-24 DIAGNOSIS — Z0000 Encounter for general adult medical examination without abnormal findings: Secondary | ICD-10-CM

## 2018-03-24 DIAGNOSIS — E119 Type 2 diabetes mellitus without complications: Secondary | ICD-10-CM | POA: Diagnosis not present

## 2018-03-24 DIAGNOSIS — Z5181 Encounter for therapeutic drug level monitoring: Secondary | ICD-10-CM

## 2018-03-24 LAB — POCT URINALYSIS DIP (PROADVANTAGE DEVICE)
Bilirubin, UA: NEGATIVE
Blood, UA: NEGATIVE
Glucose, UA: NEGATIVE mg/dL
Ketones, POC UA: NEGATIVE mg/dL
Leukocytes, UA: NEGATIVE
Nitrite, UA: NEGATIVE
Protein Ur, POC: NEGATIVE mg/dL
Specific Gravity, Urine: 1.01
Urobilinogen, Ur: NEGATIVE
pH, UA: 7 (ref 5.0–8.0)

## 2018-03-24 LAB — POCT GLYCOSYLATED HEMOGLOBIN (HGB A1C): Hemoglobin A1C: 6.7 % — AB (ref 4.0–5.6)

## 2018-03-24 MED ORDER — ALENDRONATE SODIUM 70 MG PO TABS: ORAL_TABLET | ORAL | 3 refills | Status: DC

## 2018-03-24 NOTE — Patient Instructions (Addendum)
  HEALTH MAINTENANCE RECOMMENDATIONS:  It is recommended that you get at least 30 minutes of aerobic exercise at least 5 days/week (for weight loss, you may need as much as 60-90 minutes). This can be any activity that gets your heart rate up. This can be divided in 10-15 minute intervals if needed, but try and build up your endurance at least once a week.  Weight bearing exercise is also recommended twice weekly.  Eat a healthy diet with lots of vegetables, fruits and fiber.  "Colorful" foods have a lot of vitamins (ie green vegetables, tomatoes, red peppers, etc).  Limit sweet tea, regular sodas and alcoholic beverages, all of which has a lot of calories and sugar.  Up to 1 alcoholic drink daily may be beneficial for women (unless trying to lose weight, watch sugars).  Drink a lot of water.  Calcium recommendations are 1200-1500 mg daily (1500 mg for postmenopausal women or women without ovaries), and vitamin D 1000 IU daily.  This should be obtained from diet and/or supplements (vitamins), and calcium should not be taken all at once, but in divided doses.  Monthly self breast exams and yearly mammograms for women over the age of 50 is recommended.  Sunscreen of at least SPF 30 should be used on all sun-exposed parts of the skin when outside between the hours of 10 am and 4 pm (not just when at beach or pool, but even with exercise, golf, tennis, and yard work!)  Use a sunscreen that says "broad spectrum" so it covers both UVA and UVB rays, and make sure to reapply every 1-2 hours.  Remember to change the batteries in your smoke detectors when changing your clock times in the spring and fall.  I recommend getting a carbon monoxide detector for the home.  Use your seat belt every time you are in a car, and please drive safely and not be distracted with cell phones and texting while driving.   Check the sugar content in your snacks (ie Yasso bars). Continue healthy diet, regular  exercise. Continue 1 of the metformin daily. If your A1c continues to creep up at next visit, we can increase the dose.  A1c under 7 is still controlled. (6.7 today).

## 2018-03-25 ENCOUNTER — Encounter: Payer: Self-pay | Admitting: Family Medicine

## 2018-03-25 LAB — LIPID PANEL
Chol/HDL Ratio: 2.1 ratio (ref 0.0–4.4)
Cholesterol, Total: 279 mg/dL — ABNORMAL HIGH (ref 100–199)
HDL: 133 mg/dL (ref >39)
LDL Calculated: 135 mg/dL — ABNORMAL HIGH (ref 0–99)
Triglycerides: 53 mg/dL (ref 0–149)
VLDL Cholesterol Cal: 11 mg/dL (ref 5–40)

## 2018-03-25 LAB — CBC WITH DIFFERENTIAL/PLATELET
Basophils Absolute: 0 10*3/uL (ref 0.0–0.2)
Basos: 1 %
EOS (ABSOLUTE): 0.1 10*3/uL (ref 0.0–0.4)
Eos: 2 %
Hematocrit: 41.4 % (ref 34.0–46.6)
Hemoglobin: 13.8 g/dL (ref 11.1–15.9)
Immature Grans (Abs): 0 10*3/uL (ref 0.0–0.1)
Immature Granulocytes: 0 %
Lymphocytes Absolute: 1.9 10*3/uL (ref 0.7–3.1)
Lymphs: 35 %
MCH: 32 pg (ref 26.6–33.0)
MCHC: 33.3 g/dL (ref 31.5–35.7)
MCV: 96 fL (ref 79–97)
Monocytes Absolute: 0.5 10*3/uL (ref 0.1–0.9)
Monocytes: 9 %
Neutrophils Absolute: 2.8 10*3/uL (ref 1.4–7.0)
Neutrophils: 53 %
Platelets: 245 10*3/uL (ref 150–450)
RBC: 4.31 x10E6/uL (ref 3.77–5.28)
RDW: 13 % (ref 12.3–15.4)
WBC: 5.3 10*3/uL (ref 3.4–10.8)

## 2018-03-25 LAB — COMPREHENSIVE METABOLIC PANEL
ALT: 14 IU/L (ref 0–32)
AST: 17 IU/L (ref 0–40)
Albumin/Globulin Ratio: 2.1 (ref 1.2–2.2)
Albumin: 4.6 g/dL (ref 3.6–4.8)
Alkaline Phosphatase: 62 IU/L (ref 39–117)
BUN/Creatinine Ratio: 27 (ref 12–28)
BUN: 21 mg/dL (ref 8–27)
Bilirubin Total: 0.4 mg/dL (ref 0.0–1.2)
CO2: 25 mmol/L (ref 20–29)
Calcium: 9.5 mg/dL (ref 8.7–10.3)
Chloride: 100 mmol/L (ref 96–106)
Creatinine, Ser: 0.79 mg/dL (ref 0.57–1.00)
GFR calc Af Amer: 92 mL/min/{1.73_m2} (ref >59)
GFR calc non Af Amer: 80 mL/min/{1.73_m2} (ref >59)
Globulin, Total: 2.2 g/dL (ref 1.5–4.5)
Glucose: 138 mg/dL — ABNORMAL HIGH (ref 65–99)
Potassium: 4.9 mmol/L (ref 3.5–5.2)
Sodium: 138 mmol/L (ref 134–144)
Total Protein: 6.8 g/dL (ref 6.0–8.5)

## 2018-03-25 LAB — MICROALBUMIN / CREATININE URINE RATIO
Creatinine, Urine: 24.3 mg/dL
Microalb/Creat Ratio: <12.3 mg/g creat (ref 0.0–30.0)
Microalbumin, Urine: <3 ug/mL

## 2018-03-25 LAB — TSH: TSH: 1.99 u[IU]/mL (ref 0.450–4.500)

## 2018-04-14 LAB — HM MAMMOGRAPHY

## 2018-04-15 ENCOUNTER — Encounter: Payer: Self-pay | Admitting: Family Medicine

## 2018-04-21 ENCOUNTER — Encounter: Payer: Self-pay | Admitting: Family Medicine

## 2018-04-23 ENCOUNTER — Other Ambulatory Visit: Payer: Self-pay | Admitting: Family Medicine

## 2018-04-23 DIAGNOSIS — E119 Type 2 diabetes mellitus without complications: Secondary | ICD-10-CM

## 2018-04-28 ENCOUNTER — Other Ambulatory Visit: Payer: Self-pay | Admitting: Family Medicine

## 2018-04-28 DIAGNOSIS — G4700 Insomnia, unspecified: Secondary | ICD-10-CM

## 2018-04-28 NOTE — Telephone Encounter (Signed)
Is this okay to refill? 

## 2018-07-07 ENCOUNTER — Other Ambulatory Visit (INDEPENDENT_AMBULATORY_CARE_PROVIDER_SITE_OTHER): Payer: Managed Care, Other (non HMO)

## 2018-07-07 DIAGNOSIS — Z23 Encounter for immunization: Secondary | ICD-10-CM | POA: Diagnosis not present

## 2018-07-22 LAB — HM DIABETES EYE EXAM

## 2018-08-11 ENCOUNTER — Encounter: Payer: Self-pay | Admitting: Family Medicine

## 2018-08-11 ENCOUNTER — Ambulatory Visit: Payer: Managed Care, Other (non HMO) | Admitting: Family Medicine

## 2018-08-11 VITALS — BP 130/76 | HR 90 | Temp 98.2°F | Ht 67.0 in | Wt 139.2 lb

## 2018-08-11 DIAGNOSIS — E119 Type 2 diabetes mellitus without complications: Secondary | ICD-10-CM

## 2018-08-11 DIAGNOSIS — R0789 Other chest pain: Secondary | ICD-10-CM

## 2018-08-11 LAB — POCT GLYCOSYLATED HEMOGLOBIN (HGB A1C): Hemoglobin A1C: 6.7 % — AB (ref 4.0–5.6)

## 2018-08-11 NOTE — Patient Instructions (Signed)
Your pain appears to be muscular.  Try moist heat, stretches, some rest (skip the weights). You can try topical medications such as Biofreeze. You can try an anti-inflammatory (aleve) short-term. I do not feel any, mass, swollen gland, or other concern.

## 2018-08-11 NOTE — Progress Notes (Signed)
Chief Complaint  Patient presents with  . underarm    right armpit soreness x2 weeks    She has pain in the right axilla with certain movement of the right arm. Started about 2 weeks ago. No known change in activity or injury. She does exercise regularly, but does the same activities/weight with both arms. She reports she has done some harder yoga classes, some changes in arm exercises (with eights).  No pain sitting at rest. Seemed a little better 2 days ago, but then the next day it was the same. She is worried it could be related to her breast (since having noted a lump in R axilla in 11/2017, with mammos (and [place]) done twice since then, last in July).  She doesn't notice any changes to the armpit as far as lumps/masses. Denies any abscess/boils, redness.  Denies any breast concerns.  Diabetes--she reports she has been working hard on diet for several months, cut out starches, especially with her dinner. Wants to have A1c checked today.  Last A1c was in July, 6.7%  PMH, PSH, SH reviewed  Outpatient Encounter Medications as of 08/11/2018  Medication Sig Note  . alendronate (FOSAMAX) 70 MG tablet TAKE ONE TABLET EVERY 7 DAYS WITH FULL GLASS OF WATER ON AN EMPTY STOMACH   . aspirin EC 81 MG tablet Take 1 tablet (81 mg total) by mouth daily.   Marland Kitchen BORON PO Take 1 capsule by mouth daily. 02/18/2016: Takes nightly to help her resorb the bone vitamin  . MAGNESIUM PO Take 2 tablets by mouth daily.    . metFORMIN (GLUCOPHAGE-XR) 500 MG 24 hr tablet TAKE ONE TABLET EACH DAY WITH BREAKFAST   . NON FORMULARY Take 3 capsules by mouth daily. 08/15/2015: BONE STRENGTH SUPPLEMENT (orders online)  . OMEGA 3 1200 MG CAPS Take 2,400 mg by mouth daily. Reported on 02/18/2016   . polyethylene glycol (MIRALAX / GLYCOLAX) packet Take 17 g by mouth 2 (two) times daily. 02/07/2015: Takes nightly  . TURMERIC PO Take 1 tablet by mouth daily.   . vitamin D, CHOLECALCIFEROL, 400 UNITS tablet Take 400 Units by mouth  daily.  02/01/2014: Unsure of dose.  Thinks she is just taking 1/day  . zolpidem (AMBIEN) 10 MG tablet TAKE 1/2 TO 1 TABLET AT BEDTIME AS NEEDED FOR SLEEP    Facility-Administered Encounter Medications as of 08/11/2018  Medication  . 0.9 %  sodium chloride infusion   No Known Allergies  ROS: no fever, chills, nipple discharge, URI symptoms, headaches, dizziness, chest pain, numbness, tingling.  Pain in right axilla/side per HPI. No GI complaints or other concerns. Moods are good, though anxious about this pain.   PHYSICAL EXAM:  BP 130/76   Pulse 90   Temp 98.2 F (36.8 C) (Oral)   Ht 5\' 7"  (1.702 m)   Wt 139 lb 3.2 oz (63.1 kg)   SpO2 96%   BMI 21.80 kg/m   Well-appearing, pleasant female, in no distress Examination of right axilla--normal skin, no lesions, nontender. Pain with internal rotation of right shoulder, over lateral ribs, just below axilla.  Pain in same area with some other motions of arm.  While seated, I could feel a slight raised area at the superolateral edge of the implant--nontender, and not the area of her discomfort. Focal tenderness,noted when seated, just posterior to implant, over muscles along lateral chest wall  A1c 6.7%   ASSESSMENT/PLAN:   Right-sided chest wall pain - muscular, suspect mild lat dorsi strain. Heat, stretches, rest,  NSAID prn. Reassured no lymphadenopathy or mass  Type 2 diabetes mellitus without complication, without long-term current use of insulin (HCC) - stable; controlled.   Reviewed prior mammograms, [place]. Last was 03/2018, showed benign LN on [place] (not palpable today).

## 2018-09-16 ENCOUNTER — Other Ambulatory Visit: Payer: Self-pay | Admitting: Family Medicine

## 2018-09-16 DIAGNOSIS — G4700 Insomnia, unspecified: Secondary | ICD-10-CM

## 2018-09-16 NOTE — Telephone Encounter (Signed)
Is this okay to refill? 

## 2018-09-16 NOTE — Telephone Encounter (Signed)
Last filled in August

## 2018-09-21 NOTE — Progress Notes (Signed)
Chief Complaint  Patient presents with  . med check    med check,     Last seen in November with right chest wall pain--suspected mild lat dorsi strain.  The discomfort lasted for 3-4 weeks, but ultimately completely resolved.  Diabetes: Diagnosed 07/2015 after 2 fasting sugars in the 140's-150's (A1c at that time was 6.2). She is tolerating Metforminwithout side effects.   She doesn't check blood sugars. She continues to avoid sugary drinks, watches her sweets, and exercise regularly. She has been to some diabetes education with her mother in the past, declined nutrition consult for herself. Denies polydipsia, polyuria, numbness, tingling. Last diabetic eye exam was 07/2018, no retinopathy. She continues to follow a healthy diet.  At her acute visit in November, she reported cutting out starches, especially with her dinner. A1c was done at that visit per pt request (rather than waiting for today's med check).  A1c was unchanged, had been 6.7 in July. (she was somewhat disappointed it didn't go down). Lab Results  Component Value Date   HGBA1C 6.7 (A) 08/11/2018    Her BPwas elevated last year, so lisinopril was started, but this was stopped when creatinine bumped up to 1.6. She also had some dyspnea and palpitations while on lisinopril, which resolved after its discontinuation. BP's have been running 120's-130/70. Last creatinine was back to normal Lab Results  Component Value Date   CREATININE 0.79 03/24/2018    Lipids were excellentin the past,withvery high HDL, so notput onstatin (cardiologist agrees with this recommendation).She has been compliant with taking her aspirin daily.Denies any side effects. She bruises easily, but denies any change. Lab Results  Component Value Date   CHOL 279 (H) 03/24/2018   HDL 133 03/24/2018   LDLCALC 135 (H) 03/24/2018   TRIG 53 03/24/2018   CHOLHDL 2.1 03/24/2018    Anxiety and insomnia: Anxiety related to worrying about whether  or not she can sleep, which is alleviated by having ambien around just in case. She has been using [place] as needed, and has no side effects or unusual behaviors. Only needs to take 1/2 tablet with good results, about 2-3x week, some weeks not at all. She has some travel anxiety,and will take it when she sleeps in a hotel. She is requesting alprazolam to use for travel--has a trip to [place] in April.  Osteopenia: She has been taking alendronate since September 2016. She continues to take Bone Strength "natural" calcium. Vitamin D level was normal on last check (takes a low level of D). She gets regular weight-bearing exercise. Denies any side effects or dysphagia.  Last DEXA was done 11/2016 (I had recommended checking in September, 2 years from when alendronate was started, but it was done 2 years from last scan instead).  T -2.1 on last scan.   PMH, PSH, SH reviewed  Outpatient Encounter Medications as of 09/22/2018  Medication Sig Note  . alendronate (FOSAMAX) 70 MG tablet TAKE ONE TABLET EVERY 7 DAYS WITH FULL GLASS OF WATER ON AN EMPTY STOMACH   . aspirin EC 81 MG tablet Take 1 tablet (81 mg total) by mouth daily.   Marland Kitchen BORON PO Take 1 capsule by mouth daily. 02/18/2016: Takes nightly to help her resorb the bone vitamin  . MAGNESIUM PO Take 2 tablets by mouth daily.    . metFORMIN (GLUCOPHAGE-XR) 500 MG 24 hr tablet TAKE ONE TABLET EACH DAY WITH BREAKFAST   . NON FORMULARY Take 3 capsules by mouth daily. 08/15/2015: BONE STRENGTH SUPPLEMENT (orders online)  .  OMEGA 3 1200 MG CAPS Take 2,400 mg by mouth daily. Reported on 02/18/2016   . polyethylene glycol (MIRALAX / GLYCOLAX) packet Take 17 g by mouth 2 (two) times daily. 09/22/2018: Uses 1/2 cap every night  . vitamin D, CHOLECALCIFEROL, 400 UNITS tablet Take 400 Units by mouth daily.  02/01/2014: Unsure of dose.  Thinks she is just taking 1/day  . zolpidem (AMBIEN) 10 MG tablet TAKE 1/2 TO 1 TABLET AT BEDTIME AS NEEDED FOR SLEEP   .  [DISCONTINUED] TURMERIC PO Take 1 tablet by mouth daily.    Facility-Administered Encounter Medications as of 09/22/2018  Medication  . 0.9 %  sodium chloride infusion   ROS: no fever, chills, headaches, dizziness, chest pain, shortness of breath, edema, URI symptoms, urinary complaints. Moods are good. No other concerns.  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 No weight changes.   PHYSICAL EXAM:  BP 124/80   Pulse 74   Wt 139 lb 3.2 oz (63.1 kg)   BMI 21.80 kg/m   Well developed, pleasant female in no distress HEENT: conjunctiva and sclera are clear, EOMI. OP is clear Neck: no lymphadenopathy, thyromegaly or carotid bruit. Heart: regular rate and rhythm Lungs: clear bilaterally Back: no CVA or spinal tenderness Abdomen: soft, nontender, no organomegaly or mass Extremities: no edema, normal pulses. Arthritic bony abnormalities in both hands. Psych: normal mood, affect, hygiene and grooming Neuro: alert and oriented, cranial nerves intact, normal gait    ASSESSMENT/PLAN:  Type 2 diabetes mellitus without complication, without long-term current use of insulin ([place]) - well controlled. Discussed natural course, and the potential for higher doses of metformin, even if no change in diet/exercise/wt.  Osteopenia, unspecified location - Cont Ca, D, weight-bearing exercise and alendronate.  DEXA due 11/2018  Anxiety - reviewed risks/SE of alprazolam, to use related to her upcoming trip to [place] - Plan: ALPRAZolam (XANAX) 0.25 MG tablet  Insomnia, unspecified type - continue 39m zolpidem prn (discussed potentially changing to 553mtablet in future, after age 63  DEXA due 11/2018 (STeola Bradley CPE due end of June/early July, with fasting labs prior. A1c, c-met, lipids, TSH, urine microalbumin, CBC

## 2018-09-22 ENCOUNTER — Encounter: Payer: Self-pay | Admitting: Family Medicine

## 2018-09-22 ENCOUNTER — Ambulatory Visit: Payer: Managed Care, Other (non HMO) | Admitting: Family Medicine

## 2018-09-22 VITALS — BP 124/80 | HR 74 | Wt 139.2 lb

## 2018-09-22 DIAGNOSIS — M8580 Other specified disorders of bone density and structure, unspecified site: Secondary | ICD-10-CM

## 2018-09-22 DIAGNOSIS — G4700 Insomnia, unspecified: Secondary | ICD-10-CM

## 2018-09-22 DIAGNOSIS — F419 Anxiety disorder, unspecified: Secondary | ICD-10-CM

## 2018-09-22 DIAGNOSIS — Z0000 Encounter for general adult medical examination without abnormal findings: Secondary | ICD-10-CM

## 2018-09-22 DIAGNOSIS — E119 Type 2 diabetes mellitus without complications: Secondary | ICD-10-CM | POA: Diagnosis not present

## 2018-09-22 MED ORDER — ALPRAZOLAM 0.25 MG PO TABS
0.2500 mg | ORAL_TABLET | Freq: Three times a day (TID) | ORAL | 0 refills | Status: DC | PRN
Start: 2018-09-22 — End: 2019-10-19

## 2018-09-22 NOTE — Patient Instructions (Addendum)
Continue your current medications. Let us know if you have any problems prior to your next visit (physical in 5-6 months). Call your pharmacy when refills are needed.  If you decide you want to start checking blood sugars, let us know.  Continue your healthy diet and regular exercise.

## 2018-10-25 ENCOUNTER — Other Ambulatory Visit: Payer: Self-pay | Admitting: Family Medicine

## 2018-10-25 DIAGNOSIS — E119 Type 2 diabetes mellitus without complications: Secondary | ICD-10-CM

## 2018-10-27 ENCOUNTER — Encounter: Payer: Self-pay | Admitting: Podiatry

## 2018-10-27 ENCOUNTER — Ambulatory Visit (INDEPENDENT_AMBULATORY_CARE_PROVIDER_SITE_OTHER): Payer: Managed Care, Other (non HMO)

## 2018-10-27 ENCOUNTER — Other Ambulatory Visit: Payer: Self-pay | Admitting: Podiatry

## 2018-10-27 ENCOUNTER — Ambulatory Visit: Payer: Managed Care, Other (non HMO) | Admitting: Podiatry

## 2018-10-27 VITALS — BP 128/72 | HR 76 | Resp 16

## 2018-10-27 DIAGNOSIS — M2042 Other hammer toe(s) (acquired), left foot: Principal | ICD-10-CM

## 2018-10-27 DIAGNOSIS — M79672 Pain in left foot: Secondary | ICD-10-CM | POA: Diagnosis not present

## 2018-10-27 DIAGNOSIS — M2041 Other hammer toe(s) (acquired), right foot: Secondary | ICD-10-CM | POA: Diagnosis not present

## 2018-10-27 DIAGNOSIS — M779 Enthesopathy, unspecified: Secondary | ICD-10-CM

## 2018-10-27 DIAGNOSIS — M79671 Pain in right foot: Secondary | ICD-10-CM

## 2018-10-27 DIAGNOSIS — M7751 Other enthesopathy of right foot: Secondary | ICD-10-CM

## 2018-10-27 MED ORDER — CEPHALEXIN 500 MG PO CAPS: 500.0000 mg | ORAL_CAPSULE | Freq: Two times a day (BID) | ORAL | 1 refills | Status: DC

## 2018-10-27 MED ORDER — TRIAMCINOLONE ACETONIDE 10 MG/ML IJ SUSP
10.0000 mg | Freq: Once | INTRAMUSCULAR | Status: AC
  Administered 2018-10-27: 10 mg

## 2018-10-27 NOTE — Progress Notes (Signed)
   Subjective:    Patient ID: Tracey Santiago, female    DOB: 04-01-1956, 63 y.o.   MRN: [id number]  HPI    Review of Systems  All other systems reviewed and are negative.      Objective:   Physical Exam        Assessment & Plan:

## 2018-10-27 NOTE — Progress Notes (Signed)
Subjective:   Patient ID: Tracey Santiago, female   DOB: 63 y.o.   MRN: [id number]   HPI Patient states she is had a lot of pain in her right fifth digit and it is been present for a couple months and she did treat it with a corn pad which burned her skin.  Patient has long-term diabetes that she states is under excellent control and states that the toe has been possibly swollen but she is not sure if she is not noted any drainage or other pathology.  Patient does not smoke likes to be active   Review of Systems  All other systems reviewed and are negative.       Objective:  Physical Exam Vitals signs and nursing note reviewed.  Constitutional:      Appearance: She is well-developed.  Pulmonary:     Effort: Pulmonary effort is normal.  Musculoskeletal: Normal range of motion.  Skin:    General: Skin is warm.  Neurological:     Mental Status: She is alert.     Neurovascular status intact muscle strength is adequate range of motion within normal limits with patient found to have a irritated right fifth digit with inflammation of the fourth interspace with moderate breakdown of tissue noted associated with this.  It is quite tender when pressed and it is hard for her to wear shoe gear     Assessment:  Probability for interspace lesion with capsulitis and digital deformity with slight possibility for infection but no obvious indications of infective process     Plan:  H&P and I discussed both conditions and at this point I did a sterile prep and I carefully injected the fourth interspace with 3 mg Dexasone Kenalog 5 mg Xylocaine allow the area to get numb debrided tissue and advised on soaks and as precautionary measure placed on cephalexin 500 mg twice daily.  Discussed the possibility for arthroplasty with partial syndactylization depending on response to conservative treatment  X-ray indicates enlargement of the head of the proximal phalanx digit 5 right with no other pathology  noted

## 2018-11-10 ENCOUNTER — Telehealth: Payer: Self-pay | Admitting: Podiatry

## 2018-11-10 ENCOUNTER — Ambulatory Visit: Payer: Managed Care, Other (non HMO) | Admitting: Podiatry

## 2018-11-10 ENCOUNTER — Encounter: Payer: Self-pay | Admitting: Podiatry

## 2018-11-10 DIAGNOSIS — M2041 Other hammer toe(s) (acquired), right foot: Secondary | ICD-10-CM

## 2018-11-10 NOTE — Telephone Encounter (Signed)
I saw Dr. Paulla Dolly this afternoon and I'm scheduled for surgery this coming Tuesday. He prescribed me cephalexin which I've been on a little over two weeks. Am I supposed to continue that between now and the surgery on Tuesday?

## 2018-11-10 NOTE — Patient Instructions (Signed)
Pre-Operative Instructions  Congratulations, you have decided to take an important step towards improving your quality of life.  You can be assured that the doctors and staff at [facility] will be with you every step of the way.  Here are some important things you should know:  1. Plan to be at the surgery center/hospital at least 1 (one) hour prior to your scheduled time, unless otherwise directed by the surgical center/hospital staff.  You must have a responsible adult accompany you, remain during the surgery and drive you home.  Make sure you have directions to the surgical center/hospital to ensure you arrive on time. 2. If you are having surgery at [facility] or [facility] hospitals, you will need a copy of your medical history and physical form from your family physician within one month prior to the date of surgery. We will give you a form for your primary physician to complete.  3. We make every effort to accommodate the date you request for surgery.  However, there are times where surgery dates or times have to be moved.  We will contact you as soon as possible if a change in schedule is required.   4. No aspirin/ibuprofen for one week before surgery.  If you are on aspirin, any non-steroidal anti-inflammatory medications (Mobic, Aleve, Ibuprofen) should not be taken seven (7) days prior to your surgery.  You make take Tylenol for pain prior to surgery.  5. Medications - If you are taking daily heart and blood pressure medications, seizure, reflux, allergy, asthma, anxiety, pain or diabetes medications, make sure you notify the surgery center/hospital before the day of surgery so they can tell you which medications you should take or avoid the day of surgery. 6. No food or drink after midnight the night before surgery unless directed otherwise by surgical center/hospital staff. 7. No alcoholic beverages 24-hours prior to surgery.  No smoking 24-hours prior or 24-hours after  surgery. 8. Wear loose pants or shorts. They should be loose enough to fit over bandages, boots, and casts. 9. Don't wear slip-on shoes. Sneakers are preferred. 10. Bring your boot with you to the surgery center/hospital.  Also bring crutches or a walker if your physician has prescribed it for you.  If you do not have this equipment, it will be provided for you after surgery. 11. If you have not been contacted by the surgery center/hospital by the day before your surgery, call to confirm the date and time of your surgery. 12. Leave-time from work may vary depending on the type of surgery you have.  Appropriate arrangements should be made prior to surgery with your employer. 13. Prescriptions will be provided immediately following surgery by your doctor.  Fill these as soon as possible after surgery and take the medication as directed. Pain medications will not be refilled on weekends and must be approved by the doctor. 14. Remove nail polish on the operative foot and avoid getting pedicures prior to surgery. 15. Wash the night before surgery.  The night before surgery wash the foot and leg well with water and the antibacterial soap provided. Be sure to pay special attention to beneath the toenails and in between the toes.  Wash for at least three (3) minutes. Rinse thoroughly with water and dry well with a towel.  Perform this wash unless told not to do so by your physician.  Enclosed: 1 Ice pack (please put in freezer the night before surgery)   1 Hibiclens skin cleaner     Pre-op instructions  If you have any questions regarding the instructions, please do not hesitate to call our office.  [place]: [address], [place], [place] [address] -- [phone]  [place]: [address] , [place] [address] -- [phone]  [place]: [address]  [place], [place] [address] -- [phone]  [place]: [address], [address], [place], [place] [address] -- [phone]  Website:  [email or web address] 

## 2018-11-11 ENCOUNTER — Telehealth: Payer: Self-pay | Admitting: *Deleted

## 2018-11-11 NOTE — Progress Notes (Signed)
Subjective:   Patient ID: Tracey Santiago, female   DOB: 63 y.o.   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  Patient wants to have this done and understands all conservative therapy we have tried and she has tried has not been successful and at this time after extensive review signed consent form understanding all complications as outlined in the consent form.  Patient is willing to accept risk of surgery and understands recovery can take several months with this and signed consent form and is scheduled for outpatient surgery

## 2018-11-11 NOTE — Telephone Encounter (Signed)
"  I saw Dr. Paulla Dolly yesterday and we scheduled surgery for next week.  I have some questions.  I finished the prescription for Cephalexin.  I refilled it. Do I need to continue to take Cephalexin until the surgery?  What is the name of the procedure that he will be doing in between my toes?"  That is called a webbing procedure.  "How long will I be able to be on my foot?  Can I exercise?"  You are allowed to be up on your foot about 15 minutes per hour.  He does not recommend any extensive walking or standing because you take the risk of your foot swelling.  If it swells, it can cause your foot to become painful.  "Will I be able to drive?"  I would not recommend that for a couple of weeks.  "Do you think I will be able to keep my two year old grand-daughter?"  I would not recommend it if she is busy.  "She is busy, I told my husband he would have to help whenever we keep her.  Will they call me today and let me know what time I need to be there for my surgery?"  They will call you tomorrow or Monday.  You can call the surgical center and ask them what your arrival time will be, if you would like.  "I will wait until they call.  Thanks for answering my questions.  Let me know about the antibiotic."

## 2018-11-11 NOTE — Telephone Encounter (Signed)
I am calling you back to let you know, Dr. Paulla Dolly said you didn't need to take the Cephalexin any longer.  "Okay, thanks for calling me back and letting me know.  I did get a call from the surgery center and they gave me my arrival time."

## 2018-11-11 NOTE — Telephone Encounter (Signed)
No. She does not need to take anymore

## 2018-11-15 ENCOUNTER — Telehealth: Payer: Self-pay | Admitting: *Deleted

## 2018-11-15 NOTE — Telephone Encounter (Signed)
"  I'm calling to make sure everything is okay with my insurance company regarding my surgery on tomorrow."  Yes, according to Tracey Santiago, your surgical procedures do not require authorization.  You have a $300 individual deductible that has been met and a $2500 family deductible which you still have $1250 to meet.  Once the family deductible is met, your insurance company will cover your surgery at 100%.  "So, I am good for the surgery?"  Yes, that is correct.

## 2018-11-16 DIAGNOSIS — L852 Keratosis punctata (palmaris et plantaris): Secondary | ICD-10-CM | POA: Diagnosis not present

## 2018-11-16 DIAGNOSIS — M2041 Other hammer toe(s) (acquired), right foot: Secondary | ICD-10-CM | POA: Diagnosis not present

## 2018-11-16 HISTORY — PX: FOOT SURGERY: SHX648

## 2018-11-17 ENCOUNTER — Telehealth: Payer: Self-pay | Admitting: Podiatry

## 2018-11-17 NOTE — Telephone Encounter (Signed)
I called pt, she states she felt like the bandage between the two toes was rubbing, and the toe has bleed through and rubbing the surgery site. I instructed pt to gently tug the gauze around the toe and near the surgery site to loosen and rearrange without taking the dressing off. I explained that if the dried blood became moist again that would indicate that possible the wound had begun to ooze fluid or blood and to call our office.

## 2018-11-17 NOTE — Telephone Encounter (Signed)
Surgery yesterday , stated that she thinks the space between the toes where(surgery site) is causing discomfort, wanted to know if this is something she should be worried about or see nurse.

## 2018-11-24 ENCOUNTER — Other Ambulatory Visit: Payer: Self-pay

## 2018-11-24 ENCOUNTER — Ambulatory Visit (INDEPENDENT_AMBULATORY_CARE_PROVIDER_SITE_OTHER): Payer: Self-pay | Admitting: Podiatry

## 2018-11-24 ENCOUNTER — Encounter: Payer: Self-pay | Admitting: Podiatry

## 2018-11-24 ENCOUNTER — Ambulatory Visit (INDEPENDENT_AMBULATORY_CARE_PROVIDER_SITE_OTHER): Payer: Managed Care, Other (non HMO)

## 2018-11-24 DIAGNOSIS — Z09 Encounter for follow-up examination after completed treatment for conditions other than malignant neoplasm: Secondary | ICD-10-CM

## 2018-11-24 DIAGNOSIS — M2041 Other hammer toe(s) (acquired), right foot: Secondary | ICD-10-CM | POA: Diagnosis not present

## 2018-11-26 ENCOUNTER — Encounter: Payer: Self-pay | Admitting: Podiatry

## 2018-11-26 NOTE — Progress Notes (Signed)
Subjective:   Patient ID: Tracey Santiago, female   DOB: 63 y.o.   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

## 2018-12-06 ENCOUNTER — Telehealth: Payer: Self-pay | Admitting: Podiatry

## 2018-12-06 NOTE — Telephone Encounter (Signed)
I'm scheduled to have my sutures removed Wednesday. The incision site is red for a few days. Should I come early or keep my appointment for Wednesday? I'm curious how things are running at your office as well.

## 2018-12-06 NOTE — Telephone Encounter (Signed)
I called pt and she states she has some redness and swelling around her suture line, denies heat, drainage or fever, just some redness and a little swelling, but doesn't look frightening. I told pt to begin the keflex again and keep her appt on Wednesday.

## 2018-12-08 ENCOUNTER — Ambulatory Visit (INDEPENDENT_AMBULATORY_CARE_PROVIDER_SITE_OTHER): Payer: Managed Care, Other (non HMO) | Admitting: Podiatry

## 2018-12-08 ENCOUNTER — Other Ambulatory Visit: Payer: Self-pay

## 2018-12-08 ENCOUNTER — Encounter: Payer: Self-pay | Admitting: Podiatry

## 2018-12-08 ENCOUNTER — Ambulatory Visit: Payer: Managed Care, Other (non HMO)

## 2018-12-08 DIAGNOSIS — M2041 Other hammer toe(s) (acquired), right foot: Secondary | ICD-10-CM

## 2018-12-08 DIAGNOSIS — Z09 Encounter for follow-up examination after completed treatment for conditions other than malignant neoplasm: Secondary | ICD-10-CM

## 2018-12-08 NOTE — Progress Notes (Signed)
Subjective:   Patient ID: Tracey Santiago, female   DOB: 63 y.o.   MRN: [id number]   HPI Patient presents stating that not really in any pain but at times it is discomforting and I am taking an antibiotic as I had some redness earlier in the week   ROS      Objective:  Physical Exam  Neurovascular intact with patient's fourth interspace right healing well with some slight gapping of the incision as patient had had some swelling in the area but no current redness or drainage     Assessment:  Overall appears to be doing well with stitches intact and slight gapping     Plan:  Stitches are removed and appears to be very localized and I applied Betadine to the area with sterile dressing and I did apply ice more cushion to the area and instructed on continuing to do this for the next several weeks.  I gave strict instructions if any redness drainage swelling or pain were to occur to let us know immediately and if not this should heal uneventfully in the next 4 weeks

## 2019-01-18 ENCOUNTER — Other Ambulatory Visit: Payer: Self-pay | Admitting: Family Medicine

## 2019-01-18 DIAGNOSIS — M8580 Other specified disorders of bone density and structure, unspecified site: Secondary | ICD-10-CM

## 2019-02-14 ENCOUNTER — Encounter: Payer: Self-pay | Admitting: Family Medicine

## 2019-04-04 ENCOUNTER — Telehealth: Payer: Self-pay | Admitting: *Deleted

## 2019-04-04 NOTE — Telephone Encounter (Signed)
Patient confirmed for next Wed 04/13/2019 for CPE and would like to come in 04/11/2019 @ 8:30 for fasting labs-need orders please. Thanks.

## 2019-04-04 NOTE — Telephone Encounter (Signed)
Thanks

## 2019-04-04 NOTE — Telephone Encounter (Signed)
She has future orders in the system already (were supposed to be in April). They don't expire before her visit, so should be good

## 2019-04-11 ENCOUNTER — Other Ambulatory Visit: Payer: Managed Care, Other (non HMO)

## 2019-04-11 ENCOUNTER — Other Ambulatory Visit: Payer: Self-pay

## 2019-04-11 DIAGNOSIS — Z0000 Encounter for general adult medical examination without abnormal findings: Secondary | ICD-10-CM

## 2019-04-11 DIAGNOSIS — E119 Type 2 diabetes mellitus without complications: Secondary | ICD-10-CM

## 2019-04-12 LAB — CBC WITH DIFFERENTIAL/PLATELET
Basophils Absolute: 0.1 10*3/uL (ref 0.0–0.2)
Basos: 1 %
EOS (ABSOLUTE): 0.2 10*3/uL (ref 0.0–0.4)
Eos: 3 %
Hematocrit: 41.1 % (ref 34.0–46.6)
Hemoglobin: 13.9 g/dL (ref 11.1–15.9)
Immature Grans (Abs): 0 10*3/uL (ref 0.0–0.1)
Immature Granulocytes: 0 %
Lymphocytes Absolute: 1.5 10*3/uL (ref 0.7–3.1)
Lymphs: 28 %
MCH: 32.8 pg (ref 26.6–33.0)
MCHC: 33.8 g/dL (ref 31.5–35.7)
MCV: 97 fL (ref 79–97)
Monocytes Absolute: 0.6 10*3/uL (ref 0.1–0.9)
Monocytes: 11 %
Neutrophils Absolute: 3 10*3/uL (ref 1.4–7.0)
Neutrophils: 57 %
Platelets: 252 10*3/uL (ref 150–450)
RBC: 4.24 x10E6/uL (ref 3.77–5.28)
RDW: 11.9 % (ref 11.7–15.4)
WBC: 5.4 10*3/uL (ref 3.4–10.8)

## 2019-04-12 LAB — COMPREHENSIVE METABOLIC PANEL
ALT: 14 IU/L (ref 0–32)
AST: 15 IU/L (ref 0–40)
Albumin/Globulin Ratio: 2.6 — ABNORMAL HIGH (ref 1.2–2.2)
Albumin: 4.6 g/dL (ref 3.8–4.8)
Alkaline Phosphatase: 61 IU/L (ref 39–117)
BUN/Creatinine Ratio: 29 — ABNORMAL HIGH (ref 12–28)
BUN: 21 mg/dL (ref 8–27)
Bilirubin Total: 0.7 mg/dL (ref 0.0–1.2)
CO2: 23 mmol/L (ref 20–29)
Calcium: 9.8 mg/dL (ref 8.7–10.3)
Chloride: 100 mmol/L (ref 96–106)
Creatinine, Ser: 0.73 mg/dL (ref 0.57–1.00)
GFR calc Af Amer: 101 mL/min/{1.73_m2} (ref >59)
GFR calc non Af Amer: 87 mL/min/{1.73_m2} (ref >59)
Globulin, Total: 1.8 g/dL (ref 1.5–4.5)
Glucose: 169 mg/dL — ABNORMAL HIGH (ref 65–99)
Potassium: 5 mmol/L (ref 3.5–5.2)
Sodium: 141 mmol/L (ref 134–144)
Total Protein: 6.4 g/dL (ref 6.0–8.5)

## 2019-04-12 LAB — LIPID PANEL
Chol/HDL Ratio: 2.1 ratio (ref 0.0–4.4)
Cholesterol, Total: 267 mg/dL — ABNORMAL HIGH (ref 100–199)
HDL: 129 mg/dL (ref >39)
LDL Calculated: 127 mg/dL — ABNORMAL HIGH (ref 0–99)
Triglycerides: 57 mg/dL (ref 0–149)
VLDL Cholesterol Cal: 11 mg/dL (ref 5–40)

## 2019-04-12 LAB — HEMOGLOBIN A1C
Est. average glucose Bld gHb Est-mCnc: 166 mg/dL
Hgb A1c MFr Bld: 7.4 % — ABNORMAL HIGH (ref 4.8–5.6)

## 2019-04-12 LAB — MICROALBUMIN / CREATININE URINE RATIO
Creatinine, Urine: 57 mg/dL
Microalb/Creat Ratio: <5 mg/g creat (ref 0–29)
Microalbumin, Urine: <3 ug/mL

## 2019-04-12 LAB — TSH: TSH: 1.48 u[IU]/mL (ref 0.450–4.500)

## 2019-04-12 NOTE — Progress Notes (Signed)
Chief Complaint  Patient presents with  . Annual Exam    Tracey Santiago is a 63 y.o. female who presents for a complete physical and follow up on her chronic problems.  She had labs done prior to her visit, see below.  Diabetes: Diagnosed 07/2015 after 2 fasting sugars in the 140's-150's (A1c at that time was 6.2). She is tolerating Metforminwithout side effects (slight bloating just recently, may not be related).  She doesn't check blood sugars. She continues to follow a healthy diet, limiting carbs, avoiding sugary drinks, watching her sweets.  She added some dark chocolate nuts recently, as the only change in her diet. She gets regular exercise. She denies polydipsia, polyuria, numbness, tingling. Last diabetic eye exam was 07/2018, no retinopathy.  Her BPwas elevatedlast year,so lisinopril was started, but this was stopped when creatinine bumped up to 1.6. She also had some dyspnea and palpitations while on lisinopril, which resolved after its discontinuation, and her Cr returned to normal.  BP's have been running 120's-130/70.  Lipids were excellentin the past,withvery high HDL, so notput onstatin (cardiologist agreed with this recommendation).She has been compliant with taking her aspirin daily.Denies any side effects. She bruises easily, but denies any change.  Anxiety and insomnia: Anxiety related to worrying about whether or not she can sleep, which is alleviated by having ambien around just in case. She has been using [place] as needed, and has no side effects or unusual behaviors. Only needs to take 1/2 tablet with good results, about 2-3x week, some weeks not at all. She has some travel anxiety,and will take it when she sleeps in a hotel. She also uses alprazolam for travel (filled at her last visit, was supposed to go to [place] this past Spring).  She had developed severe pain on the right of the right 5th toe. After trying to treat it herself, she saw Dr. Paulla Dolly,  who found a fracture, and infection. She was treated with antibiotics, ultimately had surgery between the right 4th and 5th toes on 11/16/2018.  Osteopenia: She has been taking alendronate since September 2016. She continues to take Bone Strength "natural" calcium. Vitamin D level was normal on last check (takes a low level of D). She gets regular weight-bearing exercise. Denies any side effects or dysphagia.  Last DEXA was done 11/2016, T -2.1 at left femoral neck.  Sister is recovering from [place] (still very symptomatic, but improving).  Immunization History  Administered Date(s) Administered  . Hepatitis A, Adult 09/16/2017, 03/24/2018  . Influenza Split 07/29/2012, 08/15/2013  . Influenza,inj,Quad PF,6+ Mos 08/15/2015, 07/09/2016, 06/09/2017, 07/07/2018  . Pneumococcal Conjugate-13 02/18/2016  . Tdap 09/15/2008, 03/24/2018  . Zoster 02/07/2015  . Zoster Recombinat (Shingrix) 03/12/2017, 06/09/2017   Last Pap smear: 01/2015 of vaginal cuff--normal with no high risk HPV Last mammogram: 03/2018, has appointment scheduled Last colonoscopy: 04/2017 with Dr. Loletha Carrow, normal. Repeat 10 years. Last DEXA: 11/2016-- T-2.1 L fem neck, stable. She is scheduled for follow-up appointment Dentist: twice yearly  Ophtho: yearly  Exercise: She is active in the yard, walking the dogs. Upper body strength exercises 5x/week. Does yoga regularly. She walks in at least 20 minute intervals throughout the day, 7 days/week. Gets 15K steps daily. Playing lots of ping pong during the pandemic.  Past Medical History:  Diagnosis Date  . Anemia   . Anxiety    "mild"  . Arthritis    spine, right hand, hip  . Carpal tunnel syndrome, bilateral    no problems s/p surgery bilaterally  .  Cervical cancer ([facility]) 2005  . DDD (degenerative disc disease), cervical    C4-5, C5-6, some oseophytosis, foraminal narrowing C3-4, C4-5  . Diabetes mellitus without complication ([facility])   . Hyperlipidemia    mild  .  Hypertension   . Impaired fasting glucose    DM diagnosed 07/2015 for 2 fasting glucose >126  . 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 Post-operative nausea and vomiting     Past Surgical History:  Procedure Laterality Date  . ABDOMINAL HYSTERECTOMY  05/28/2004   pelvic lymphadenectomy, ovarian transposition, suprapubic cath. placement  . BASAL CELL CARCINOMA EXCISION  02/09/2018   basal cell carinoma, superficial an nodular patterns  . BREAST SURGERY  1987   breast augmentation, implants replaced 11/2008  . CARPAL TUNNEL RELEASE  11/07/2011   Procedure: CARPAL TUNNEL RELEASE;  Surgeon: Cammie Sickle., MD;  Location: [facility];  Service: Orthopedics;  Laterality: Left;  . CARPAL TUNNEL RELEASE  04/08/2012   Procedure: CARPAL TUNNEL RELEASE;  Surgeon: Cammie Sickle., MD;  Location: [facility];  Service: Orthopedics;  Laterality: Right;  . CATARACT EXTRACTION, BILATERAL  Feb and March 2019   Dr. Anne Shutter  . CESAREAN SECTION    . childbirth     x3- 2 NVD, 1 c-section  . FOOT SURGERY Right 11/16/2018   Dr. Paulla Dolly (between 4rh and 5th toes)  . HAMMER TOE SURGERY Left 06/2012  . POSTERIOR LAMINECTOMY / DECOMPRESSION LUMBAR SPINE  07/21/2011   arthrodesis L4-5 (Dr. Ellene Route)  . TONSILLECTOMY    . TOTAL HIP ARTHROPLASTY Right 12/13/2013   Procedure: RIGHT TOTAL HIP ARTHROPLASTY ANTERIOR APPROACH;  Surgeon: Mauri Pole, MD;  Location: [facility];  Service: Orthopedics;  Laterality: Right;    Social History   Socioeconomic History  . Marital status: Married    Spouse name: Not on file  . Number of children: 3  . Years of education: Not on file  . Highest education level: Not on file  Occupational History  . Occupation: Works at [employer]: [employer]  Social Needs  . Financial resource strain: Not on file  . Food insecurity    Worry: Not on file    Inability: Not on file  . Transportation needs     Medical: Not on file    Non-medical: Not on file  Tobacco Use  . Smoking status: Former Smoker    Quit date: 07/13/1996    Years since quitting: 22.7  . Smokeless tobacco: Never Used  Substance and Sexual Activity  . Alcohol use: Yes    Alcohol/week: 0.0 standard drinks    Comment: 5-6 drinks/week  . Drug use: No  . Sexual activity: Yes    Partners: Male    Birth control/protection: Surgical  Lifestyle  . Physical activity    Days per week: Not on file    Minutes per session: Not on file  . Stress: Not on file  Relationships  . Social Herbalist on phone: Not on file    Gets together: Not on file    Attends religious service: Not on file    Active member of club or organization: Not on file    Attends meetings of clubs or organizations: Not on file    Relationship status: Not on file  . Intimate partner violence    Fear of current or ex partner: Not on file    Emotionally abused: Not  on file    Physically abused: Not on file    Forced sexual activity: Not on file  Other Topics Concern  . Not on file  Social History Narrative   Lives with husband.  Daughter and son in [place],  1 son in [place] (and travels a lot to [place]).    Son with opioid addiction (why he moved back to [facility])   Daughter diagnosed with MS 2017.  Has 2 grandchildren (Aylin (granddaughter) and Gus)    Family History  Problem Relation Age of Onset  . Osteoporosis Mother   . Arthritis Mother   . Diabetes Mother   . Hearing loss Mother   . Dementia Mother   . Hypertension Father   . Cancer Father   . Heart disease Father 53       CABG  . Peripheral vascular disease Father        s/p bypasses  . Lung cancer Father   . Alcohol abuse Sister        recovered  . Urolithiasis Sister   . Multiple sclerosis Daughter 75  . Drug abuse Son        opioid addiction  . Heart disease Maternal Grandfather   . Colon cancer Neg Hx   . Esophageal cancer Neg Hx   . Rectal cancer Neg Hx   .  Stomach cancer Neg Hx   . Pancreatic cancer Neg Hx     Outpatient Encounter Medications as of 04/13/2019  Medication Sig Note  . alendronate (FOSAMAX) 70 MG tablet TAKE ONE TABLET EVERY 7 DAYS WITH FULL GLASS OF WATER ON AN EMPTY STOMACH   . ALPRAZolam (XANAX) 0.25 MG tablet Take 1-2 tablets (0.25-0.5 mg total) by mouth 3 (three) times daily as needed for anxiety.   Marland Kitchen aspirin EC 81 MG tablet Take 1 tablet (81 mg total) by mouth daily.   Marland Kitchen BORON PO Take 1 capsule by mouth daily. 02/18/2016: Takes nightly to help her resorb the bone vitamin  . MAGNESIUM PO Take 2 tablets by mouth daily.    . metFORMIN (GLUCOPHAGE-XR) 500 MG 24 hr tablet TAKE ONE TABLET DAILY WITH BREAKFAST   . NON FORMULARY Take 3 capsules by mouth daily. 08/15/2015: BONE STRENGTH SUPPLEMENT (orders online)  . OMEGA 3 1200 MG CAPS Take 2,400 mg by mouth daily. Reported on 02/18/2016   . vitamin D, CHOLECALCIFEROL, 400 UNITS tablet Take 400 Units by mouth daily.  02/01/2014: Unsure of dose.  Thinks she is just taking 1/day  . zolpidem (AMBIEN) 10 MG tablet TAKE 1/2 TO 1 TABLET AT BEDTIME AS NEEDED FOR SLEEP   . polyethylene glycol (MIRALAX / GLYCOLAX) packet Take 17 g by mouth 2 (two) times daily. (Patient not taking: Reported on 04/13/2019)   . [DISCONTINUED] cephALEXin (KEFLEX) 500 MG capsule Take 1 capsule (500 mg total) by mouth 2 (two) times daily. (Patient not taking: Reported on 04/13/2019)    Facility-Administered Encounter Medications as of 04/13/2019  Medication  . 0.9 %  sodium chloride infusion    No Known Allergies  ROS: Denies weight changes, anorexia, dizziness, syncope, cough, swelling, vomiting, diarrhea, constipation, abdominal pain, melena, hematochezia, indigestion/heartburn, hematuria, incontinence, dysuria, vaginal bleeding, discharge, odor or itch, genital lesions,numbness, tingling, weakness, tremor, suspicious skin lesions, depression, abnormal bleeding/bruising, or enlarged lymph nodes.  Bowels are normal,  no longer needing miralax.  Some bloating just recently.  No further issues with neck pain (yoga and home exercises help). Intermittent insomnia, per HPI. Some dryness with intercourse, but  does well with lubricant, tolerable. 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   PHYSICAL EXAM:  BP 128/76   Pulse 74   Temp 97.8 F (36.6 C) (Oral)   Ht 5\' 7"  (1.702 m)   Wt 137 lb (62.1 kg)   SpO2 97%   BMI 21.46 kg/m   Wt Readings from Last 3 Encounters:  04/13/19 137 lb (62.1 kg)  09/22/18 139 lb 3.2 oz (63.1 kg)  08/11/18 139 lb 3.2 oz (63.1 kg)    General Appearance:  Alert, cooperative, no distress, appears stated age or somewhat younger  Head:  Normocephalic, without obvious abnormality, atraumatic   Eyes:  PERRL, conjunctiva/corneas clear, EOM's intact, fundi benign   Ears:  Normal TM's and external ear canals   Nose:  Not examined (wearing mask due to COVID-19 pandemic)  Throat:  Not examined (wearing mask due to COVID-19 pandemic)  Neck:  Supple, no lymphadenopathy; thyroid: no enlargement/ tenderness/nodules; no carotid bruit or JVD   Back:  Spine nontender, no curvature, ROM normal, no CVA tenderness   Lungs:  Clear to auscultation bilaterally without wheezes, rales or ronchi; respirations unlabored   Chest Wall:  No tenderness or deformity   Heart:  Regular rate and rhythm, S1 and S2 normal, no murmur, rub  or gallop   Breast Exam:  No tenderness, masses, or nipple discharge or inversion. No axillary lymphadenopathy. + implants, with some "crackling" sensation with palpation over the lateral/UOP aspects.   Abdomen:  Soft, non-tender, nondistended, normoactive bowel sounds,  no masses, no hepatosplenomegaly   Genitalia:  Normal external genitalia without lesions.Mild atrophic changes noted.BUS and vagina normal; No  abnormal vaginal discharge. Uterus and ovaries are surgically absent, nontender, no mass. Pap not performed.  Rectal:  Normal tone, no masses or tenderness; heme negative light brown stool   Extremities:  No clubbing, cyanosis or edema. Slight hammertoe of 2nd toe on right  Pulses:  2+ and symmetric all extremities   Skin:  Skin color, texture, turgor normal, no rashes or lesions. Some maceration noted between 4th and 5th toes on the right.  Well healed incision  Lymph nodes:  Cervical, supraclavicular, and axillary nodes normal   Neurologic:  CNII-XII intact, normal strength, sensation and gait; reflexes 2+ and symmetric throughout    Psych: Normal mood, affect, hygiene and grooming  Normal diabetic foot exam  Update foot--hammertoe R 2nd toe (mild)  Lab Results  Component Value Date   HGBA1C 7.4 (H) 04/11/2019   Fasting glucose 169    Chemistry      Component Value Date/Time   NA 141 04/11/2019 0845   K 5.0 04/11/2019 0845   K 5.0 07/28/2012 1251   CL 100 04/11/2019 0845   CL 107 07/28/2012 1251   CO2 23 04/11/2019 0845   CO2 23 07/28/2012 1251   BUN 21 04/11/2019 0845   CREATININE 0.73 04/11/2019 0845   CREATININE 0.82 03/04/2017 0736      Component Value Date/Time   CALCIUM 9.8 04/11/2019 0845   CALCIUM 9.4 07/28/2012 1251   ALKPHOS 61 04/11/2019 0845   ALKPHOS 37 07/28/2012 1251   AST 15 04/11/2019 0845   AST 18 07/28/2012 1251   ALT 14 04/11/2019 0845   BILITOT 0.7 04/11/2019 0845   BILITOT 0.4 07/28/2012 1251     Lab Results  Component Value Date   TSH 1.480 04/11/2019   Lab Results  Component Value  Date   CHOL 267 (H) 04/11/2019   HDL 129 04/11/2019   LDLCALC 127 (H) 04/11/2019   TRIG 57 04/11/2019   CHOLHDL 2.1 04/11/2019   Lab Results  Component Value Date   WBC 5.4 04/11/2019   HGB 13.9 04/11/2019   HCT 41.1 04/11/2019   MCV 97 04/11/2019   PLT 252 04/11/2019   Urine microalbumin/Cr ratio   <5  ASSESSMENT/PLAN:  Annual physical exam   Osteopenia, unspecified location - [facility]. Cont Ca, Vit D and weight-bearing exercise. Cont alendronate  Type 2 diabetes mellitus without complication, without long-term current use of insulin (HCC) - suboptimally controlled; increase metformin to 1000mg  daily. Encouraged to start checking blood sugars - Plan: metFORMIN (GLUCOPHAGE-XR) 500 MG 24 hr tablet  Insomnia - continue prn use of ambien - Plan: zolpidem (AMBIEN) 10 MG tablet  Recommended checking blood sugars--to check with insurance to see which test strips are covered (and can check with [place] to see if we have a monitor).  Counseled how/when and how to record. To f/u sooner if morning sugars are consistently >140, otherwise f/u 6 months.  Discussed monthly self breast exams and yearly mammograms (due now); at least 30 minutes of aerobic activity at least 5 days/week ,weight bearing exercise at least 2x/wk; proper sunscreen use reviewed; healthy diet, including goals of calcium and vitamin D intake and alcohol recommendations (less than or equal to 1 drink/day) reviewed; regular seatbelt use; changing batteries in smoke detectors, carbon monoxide detector recommended. Immunization recommendations discussed--continue yearly flu shots (high dose if she gets after turning 63). Pneumovax age 64.  Colonoscopy UTD, due again 04/2027. Pap next year.  F/u 6 months, A1c at visit

## 2019-04-12 NOTE — Patient Instructions (Addendum)
  HEALTH MAINTENANCE RECOMMENDATIONS:  It is recommended that you get at least 30 minutes of aerobic exercise at least 5 days/week (for weight loss, you may need as much as 60-90 minutes). This can be any activity that gets your heart rate up. This can be divided in 10-15 minute intervals if needed, but try and build up your endurance at least once a week.  Weight bearing exercise is also recommended twice weekly.  Eat a healthy diet with lots of vegetables, fruits and fiber.  "Colorful" foods have a lot of vitamins (ie green vegetables, tomatoes, red peppers, etc).  Limit sweet tea, regular sodas and alcoholic beverages, all of which has a lot of calories and sugar.  Up to 1 alcoholic drink daily may be beneficial for women (unless trying to lose weight, watch sugars).  Drink a lot of water.  Calcium recommendations are 1200-1500 mg daily (1500 mg for postmenopausal women or women without ovaries), and vitamin D 1000 IU daily.  This should be obtained from diet and/or supplements (vitamins), and calcium should not be taken all at once, but in divided doses.  Monthly self breast exams and yearly mammograms for women over the age of 34 is recommended.  Sunscreen of at least SPF 30 should be used on all sun-exposed parts of the skin when outside between the hours of 10 am and 4 pm (not just when at beach or pool, but even with exercise, golf, tennis, and yard work!)  Use a sunscreen that says "broad spectrum" so it covers both UVA and UVB rays, and make sure to reapply every 1-2 hours.  Remember to change the batteries in your smoke detectors when changing your clock times in the spring and fall. Carbon monoxide detectors are recommended for your home.  Use your seat belt every time you are in a car, and please drive safely and not be distracted with cell phones and texting while driving.  Increase your Metformin ER to taking 2 pills before/with breakfast. If it bothers your stomach, you can split it  twice daily. I think it would be beneficial to start checking your blood sugars. Check with your insurance to see which test strips are covered.  We can then send in a prescription for lancets and test strips. If we happen to have that monitor available, we can give it to you, otherwise get it from the pharmacy as well.  Check your sugars fasting in the morning (before eating), and also check it 2 hours after dinner or at bedtime.  Keep track of this on a log (your monitor may come with one, or you can find one online).

## 2019-04-13 ENCOUNTER — Ambulatory Visit: Payer: Managed Care, Other (non HMO) | Admitting: Family Medicine

## 2019-04-13 ENCOUNTER — Encounter: Payer: Self-pay | Admitting: Family Medicine

## 2019-04-13 ENCOUNTER — Other Ambulatory Visit: Payer: Self-pay

## 2019-04-13 VITALS — BP 128/76 | HR 74 | Temp 97.8°F | Ht 67.0 in | Wt 137.0 lb

## 2019-04-13 DIAGNOSIS — E119 Type 2 diabetes mellitus without complications: Secondary | ICD-10-CM | POA: Diagnosis not present

## 2019-04-13 DIAGNOSIS — G4700 Insomnia, unspecified: Secondary | ICD-10-CM | POA: Diagnosis not present

## 2019-04-13 DIAGNOSIS — M8580 Other specified disorders of bone density and structure, unspecified site: Secondary | ICD-10-CM | POA: Diagnosis not present

## 2019-04-13 DIAGNOSIS — Z0000 Encounter for general adult medical examination without abnormal findings: Secondary | ICD-10-CM | POA: Diagnosis not present

## 2019-04-13 MED ORDER — ZOLPIDEM TARTRATE 10 MG PO TABS: ORAL_TABLET | ORAL | 0 refills | Status: DC

## 2019-04-13 MED ORDER — METFORMIN HCL ER 500 MG PO TB24: 1000.0000 mg | ORAL_TABLET | Freq: Every day | ORAL | 1 refills | Status: DC

## 2019-04-19 ENCOUNTER — Encounter: Payer: Self-pay | Admitting: Family Medicine

## 2019-04-19 NOTE — Telephone Encounter (Signed)
Please advise patient regarding the monitors we have in the office and rx strips.  Thanks

## 2019-04-20 ENCOUNTER — Other Ambulatory Visit: Payer: Self-pay | Admitting: *Deleted

## 2019-04-20 MED ORDER — GLUCOSE BLOOD VI STRP: 1.0000 | ORAL_STRIP | Freq: Two times a day (BID) | 2 refills | Status: DC

## 2019-04-20 MED ORDER — ONETOUCH DELICA LANCETS 33G MISC: 1.0000 | Freq: Two times a day (BID) | 2 refills | Status: AC

## 2019-05-04 LAB — HM DEXA SCAN

## 2019-05-04 LAB — HM MAMMOGRAPHY

## 2019-05-09 ENCOUNTER — Telehealth: Payer: Self-pay | Admitting: Family Medicine

## 2019-05-09 NOTE — Telephone Encounter (Signed)
Received bone density via mail

## 2019-05-10 ENCOUNTER — Encounter: Payer: Self-pay | Admitting: Family Medicine

## 2019-05-11 ENCOUNTER — Encounter: Payer: Self-pay | Admitting: *Deleted

## 2019-05-20 ENCOUNTER — Other Ambulatory Visit: Payer: Self-pay | Admitting: Family Medicine

## 2019-05-20 DIAGNOSIS — M8580 Other specified disorders of bone density and structure, unspecified site: Secondary | ICD-10-CM

## 2019-05-20 NOTE — Telephone Encounter (Signed)
Is this appropriate?  

## 2019-05-25 ENCOUNTER — Encounter: Payer: Self-pay | Admitting: Family Medicine

## 2019-08-03 LAB — HM DIABETES EYE EXAM

## 2019-10-17 ENCOUNTER — Ambulatory Visit: Payer: Managed Care, Other (non HMO)

## 2019-10-18 NOTE — Progress Notes (Signed)
Chief Complaint  Patient presents with  . Diabetes    nonfasting med check. Brought in blood sugar log for you to look at. Is scheduled for COVID #1 vaccine this coming Friday. Got her high dose flu vaccine at [employer] abstract[facility].     Diabetes: Diagnosed 07/2015 after 2 fasting sugars in the 140's-150's (A1cat that time was6.2).  Her metformin was increased from 500mg  to 1000mg  at her physical this past July.  She had some bloating, nausea and stomach upset initially, and changed dosing to BID. She also added B12 per the pharmacist recommendation.  She takes it at 8am and 8pm (feels bad if she takes too close together). She is now monitoring her sugars, only checking after meals, not fasting.  She notes high blood sugars if she eats bread (highest was 225 two hours after a sandwich).  She has cut back significantly on bread, and sugars have improved.  Sugars are ranging from 85 up to 225.  Mostly under 150.  She continues to follow a healthy diet, limiting carbs, avoiding sugary drinks, watching her sweets. At her last visit (when A1c had increased) she had been eating some dark chocolate nuts. She will now have a small piece of 80% dark chocolate after dinner.  She gets regular exercise. She continues to do yoga daily.She denies polydipsia, polyuria, numbness, tingling. Last diabetic eye exam was 07/2019, no retinopathy. Lab Results  Component Value Date   HGBA1C 7.4 (H) 04/11/2019    Her BPwas elevatedlast year,so lisinopril was started, but this was stopped when creatinine bumped up to 1.6.She also had some dyspnea and palpitations while on lisinopril, which resolvedafter its discontinuation, and her Cr returned to normal.  BP's have been normal since. BP Readings from Last 3 Encounters:  04/13/19 128/76  10/27/18 128/72  09/22/18 124/80   Lipids were excellentin the past,withvery high HDL, so notput onstatin (cardiologist agreed with this recommendation).She has been  compliant with taking her aspirin daily.Denies any side effects. She bruises easily, but denies any change.  Osteopenia: She has been taking alendronate since September 2016. She continues to [employer] "natural" calcium. Vitamin D level was normal on last check (takes a low level of D).She gets regular weight-bearing exercise. Denies any side effects or dysphagia.  She underwent f/u DEXA 04/2019, showing improvement at left femoral neck.  Lowest was at L wrist, T-1.9.   PMH, PSH, SH reviewed  Outpatient Encounter Medications as of 10/19/2019  Medication Sig Note  . alendronate (FOSAMAX) 70 MG tablet TAKE ONE TABLET EVERY 7 DAYS WITH FULL GLASS OF WATER ON AN EMPTY STOMACH   . Ascorbic Acid (VITAMIN C PO) Take 500 mg by mouth daily.   Marland Kitchen aspirin EC 81 MG tablet Take 1 tablet (81 mg total) by mouth daily.   . Cyanocobalamin (B-12 PO) Take 1 tablet by mouth daily.   Marland Kitchen glucose blood test strip 1 each by Other route 2 (two) times daily. One Touch Verio Test Strips   . MAGNESIUM PO Take 2 tablets by mouth daily.    . metFORMIN (GLUCOPHAGE-XR) 500 MG 24 hr tablet Take 2 tablets (1,000 mg total) by mouth daily with breakfast.   . NON FORMULARY Take 3 capsules by mouth daily. 08/15/2015: BONE STRENGTH SUPPLEMENT (orders online)  . OneTouch Delica Lancets 99991111 MISC 1 each by Does not apply route 2 (two) times daily.   Marland Kitchen zolpidem (AMBIEN) 10 MG tablet 1/2-1 tablet by mouth once daily as needed for insomnia   . [DISCONTINUED]  OMEGA 3 1200 MG CAPS Take 2,400 mg by mouth daily. Reported on 02/18/2016   . ALPRAZolam (XANAX) 0.25 MG tablet Take 1-2 tablets (0.25-0.5 mg total) by mouth 3 (three) times daily as needed for anxiety. (Patient not taking: Reported on 04/13/2019)   . [DISCONTINUED] BORON PO Take 1 capsule by mouth daily. 02/18/2016: Takes nightly to help her resorb the bone vitamin  . [DISCONTINUED] polyethylene glycol (MIRALAX / GLYCOLAX) packet Take 17 g by mouth 2 (two) times daily. (Patient  not taking: Reported on 04/13/2019)   . [DISCONTINUED] vitamin D, CHOLECALCIFEROL, 400 UNITS tablet Take 400 Units by mouth daily.  02/01/2014: Unsure of dose.  Thinks she is just taking 1/day   Facility-Administered Encounter Medications as of 10/19/2019  Medication  . 0.9 %  sodium chloride infusion   Stopped taking D recently. Stopped taking fish oil.   No Known Allergies   ROS:  No fever, chills, URI symptoms, headaches, dizziness, chest pain, palpitations, URI symptoms, cough, shortness of breath, edema.  No bleeding or bruising.  Moods are good. No GI or GU complaints.  Bowels are okay as long as she doesn't take the metformin doses too close together.   +weight loss since making dietary changes.   PHYSICAL EXAM:  BP 110/70   Pulse 72   Temp 98.2 F (36.8 C) (Other (Comment))   Ht 5\' 7"  (1.702 m)   Wt 130 lb 6.4 oz (59.1 kg)   BMI 20.42 kg/m   Wt Readings from Last 3 Encounters:  10/19/19 130 lb 6.4 oz (59.1 kg)  04/13/19 137 lb (62.1 kg)  09/22/18 139 lb 3.2 oz (63.1 kg)   Well-appearing, pleasant female, in good spirits. HEENT: conjunctiva and sclera are clear, EOMI. Wearing mask. Neck: no lymphadenopathy, thyromegaly or carotid bruit. Heart: regular rate and rhythm Lungs: clear bilaterally Back: no CVA or spinal tenderness Abdomen: soft, nontender, no organomegaly or mass Extremities: no edema, normal pulses. Lipoma noted at the top of her left shoulder, nontender Arthritic bony abnormalities in both hands. Psych: normal mood, affect, hygiene and grooming Neuro: alert and oriented, normal gait  Lab Results  Component Value Date   HGBA1C 5.7 (A) 10/19/2019     ASSESSMENT/PLAN:  Type 2 diabetes mellitus without complication, without long-term current use of insulin (HCC) - continue metformin BID; cont monitoring sugars, occ also checking fasting sugars - Plan: HgB A1c, metFORMIN (GLUCOPHAGE-XR) 500 MG 24 hr tablet  Osteopenia, unspecified location -  discussed Ca, to restart her D. Cont weight-bearing exercise. Recommended adding weights (rather than just bodyweight) to bulk up muscles; she is very thin.  Type 2 diabetes mellitus without complication, without long-term current use of insulin (HCC) - suboptimally controlled; increase metformin to 1000mg  daily. Encouraged to start checking blood sugars - Plan: HgB A1c, metFORMIN (GLUCOPHAGE-XR) 500 MG 24 hr tablet  Anxiety - reviewed risks/SE of alprazolam, to use sparingly (last rx lasted a year) - Plan: ALPRAZolam (XANAX) 0.25 MG tablet  Insomnia - continue prn use of ambien; due to being age 64, dose was changed to 5mg  (prev only took 1/2 tablet anyway) - Plan: zolpidem (AMBIEN) 5 MG tablet  Discussed rec for pneumovax at age 21, but will hold off until her next visit as she is scheduled for her COVID vaccine later this week. Counseled re: COVID vaccine and potential SE's.   Refill metformin Wants refill alprazolam (last filled 09/2018) and zolpidem (last filled 05/2019).  F/u as scheduled in August.

## 2019-10-19 ENCOUNTER — Other Ambulatory Visit: Payer: Self-pay

## 2019-10-19 ENCOUNTER — Encounter: Payer: Self-pay | Admitting: Family Medicine

## 2019-10-19 ENCOUNTER — Ambulatory Visit: Payer: Managed Care, Other (non HMO) | Admitting: Family Medicine

## 2019-10-19 VITALS — BP 110/70 | HR 72 | Temp 98.2°F | Ht 67.0 in | Wt 130.4 lb

## 2019-10-19 DIAGNOSIS — M8580 Other specified disorders of bone density and structure, unspecified site: Secondary | ICD-10-CM

## 2019-10-19 DIAGNOSIS — E119 Type 2 diabetes mellitus without complications: Secondary | ICD-10-CM

## 2019-10-19 DIAGNOSIS — G4700 Insomnia, unspecified: Secondary | ICD-10-CM | POA: Diagnosis not present

## 2019-10-19 DIAGNOSIS — F419 Anxiety disorder, unspecified: Secondary | ICD-10-CM

## 2019-10-19 LAB — POCT GLYCOSYLATED HEMOGLOBIN (HGB A1C): Hemoglobin A1C: 5.7 % — AB (ref 4.0–5.6)

## 2019-10-19 MED ORDER — ZOLPIDEM TARTRATE 5 MG PO TABS: 5.0000 mg | ORAL_TABLET | Freq: Every evening | ORAL | 0 refills | Status: DC | PRN

## 2019-10-19 MED ORDER — ALPRAZOLAM 0.25 MG PO TABS: 0.2500 mg | ORAL_TABLET | Freq: Three times a day (TID) | ORAL | 0 refills | Status: DC | PRN

## 2019-10-19 MED ORDER — METFORMIN HCL ER 500 MG PO TB24: 1000.0000 mg | ORAL_TABLET | Freq: Every day | ORAL | 1 refills | Status: DC

## 2019-10-19 NOTE — Patient Instructions (Signed)
Continue your current medications. I recommend monitoring your sugars sometimes fasting (before eating).  Keep them in separate columns as indicated on your paper/book.

## 2019-10-23 ENCOUNTER — Ambulatory Visit: Payer: Managed Care, Other (non HMO)

## 2019-10-26 ENCOUNTER — Telehealth: Payer: Self-pay | Admitting: Internal Medicine

## 2019-10-26 DIAGNOSIS — Z0000 Encounter for general adult medical examination without abnormal findings: Secondary | ICD-10-CM

## 2019-10-26 DIAGNOSIS — Z5181 Encounter for therapeutic drug level monitoring: Secondary | ICD-10-CM

## 2019-10-26 DIAGNOSIS — E119 Type 2 diabetes mellitus without complications: Secondary | ICD-10-CM

## 2019-10-26 NOTE — Telephone Encounter (Signed)
Pt is scheduled for afternoon cpe on 8/18 and scheduled for 8/16 for fasting labs. Please put in future orders

## 2019-10-27 NOTE — Telephone Encounter (Signed)
done

## 2020-04-18 ENCOUNTER — Encounter: Payer: Managed Care, Other (non HMO) | Admitting: Family Medicine

## 2020-04-21 ENCOUNTER — Other Ambulatory Visit: Payer: Self-pay | Admitting: Family Medicine

## 2020-04-21 DIAGNOSIS — E119 Type 2 diabetes mellitus without complications: Secondary | ICD-10-CM

## 2020-04-21 DIAGNOSIS — M8580 Other specified disorders of bone density and structure, unspecified site: Secondary | ICD-10-CM

## 2020-04-30 ENCOUNTER — Other Ambulatory Visit: Payer: Self-pay

## 2020-04-30 ENCOUNTER — Other Ambulatory Visit: Payer: Managed Care, Other (non HMO)

## 2020-04-30 DIAGNOSIS — Z0000 Encounter for general adult medical examination without abnormal findings: Secondary | ICD-10-CM

## 2020-04-30 DIAGNOSIS — E119 Type 2 diabetes mellitus without complications: Secondary | ICD-10-CM

## 2020-04-30 DIAGNOSIS — Z5181 Encounter for therapeutic drug level monitoring: Secondary | ICD-10-CM

## 2020-05-01 LAB — COMPREHENSIVE METABOLIC PANEL
ALT: 15 IU/L (ref 0–32)
AST: 17 IU/L (ref 0–40)
Albumin/Globulin Ratio: 2.2 (ref 1.2–2.2)
Albumin: 4.7 g/dL (ref 3.8–4.8)
Alkaline Phosphatase: 59 IU/L (ref 48–121)
BUN/Creatinine Ratio: 29 — ABNORMAL HIGH (ref 12–28)
BUN: 22 mg/dL (ref 8–27)
Bilirubin Total: 0.7 mg/dL (ref 0.0–1.2)
CO2: 24 mmol/L (ref 20–29)
Calcium: 9.8 mg/dL (ref 8.7–10.3)
Chloride: 100 mmol/L (ref 96–106)
Creatinine, Ser: 0.75 mg/dL (ref 0.57–1.00)
GFR calc Af Amer: 97 mL/min/{1.73_m2} (ref >59)
GFR calc non Af Amer: 84 mL/min/{1.73_m2} (ref >59)
Globulin, Total: 2.1 g/dL (ref 1.5–4.5)
Glucose: 145 mg/dL — ABNORMAL HIGH (ref 65–99)
Potassium: 4.4 mmol/L (ref 3.5–5.2)
Sodium: 135 mmol/L (ref 134–144)
Total Protein: 6.8 g/dL (ref 6.0–8.5)

## 2020-05-01 LAB — CBC WITH DIFFERENTIAL/PLATELET
Basophils Absolute: 0.1 10*3/uL (ref 0.0–0.2)
Basos: 1 %
EOS (ABSOLUTE): 0.1 10*3/uL (ref 0.0–0.4)
Eos: 2 %
Hematocrit: 42.7 % (ref 34.0–46.6)
Hemoglobin: 14.2 g/dL (ref 11.1–15.9)
Immature Grans (Abs): 0 10*3/uL (ref 0.0–0.1)
Immature Granulocytes: 0 %
Lymphocytes Absolute: 1.6 10*3/uL (ref 0.7–3.1)
Lymphs: 27 %
MCH: 31.5 pg (ref 26.6–33.0)
MCHC: 33.3 g/dL (ref 31.5–35.7)
MCV: 95 fL (ref 79–97)
Monocytes Absolute: 0.5 10*3/uL (ref 0.1–0.9)
Monocytes: 8 %
Neutrophils Absolute: 3.7 10*3/uL (ref 1.4–7.0)
Neutrophils: 62 %
Platelets: 241 10*3/uL (ref 150–450)
RBC: 4.51 x10E6/uL (ref 3.77–5.28)
RDW: 11.9 % (ref 11.7–15.4)
WBC: 5.9 10*3/uL (ref 3.4–10.8)

## 2020-05-01 LAB — LIPID PANEL
Chol/HDL Ratio: 2 ratio (ref 0.0–4.4)
Cholesterol, Total: 279 mg/dL — ABNORMAL HIGH (ref 100–199)
HDL: 137 mg/dL (ref >39)
LDL Chol Calc (NIH): 135 mg/dL — ABNORMAL HIGH (ref 0–99)
Triglycerides: 48 mg/dL (ref 0–149)
VLDL Cholesterol Cal: 7 mg/dL (ref 5–40)

## 2020-05-01 LAB — MICROALBUMIN / CREATININE URINE RATIO
Creatinine, Urine: 73.2 mg/dL
Microalb/Creat Ratio: <4 mg/g creat (ref 0–29)
Microalbumin, Urine: <3 ug/mL

## 2020-05-01 LAB — HEMOGLOBIN A1C
Est. average glucose Bld gHb Est-mCnc: 137 mg/dL
Hgb A1c MFr Bld: 6.4 % — ABNORMAL HIGH (ref 4.8–5.6)

## 2020-05-01 LAB — TSH: TSH: 1.69 u[IU]/mL (ref 0.450–4.500)

## 2020-05-01 NOTE — Progress Notes (Signed)
Chief Complaint  Patient presents with  . Medicare Wellness    nonfasting CPE with pap. Only has [place]. No concerns.    Tracey Santiago is a 64 y.o. female who presents for a complete physical and f/u on chronic problems.  See below for labs done prior to her visit.   Diabetes: Diagnosed 07/2015 after 2 fasting sugars in the 140's-150's (A1cat that time was6.2). She continues on Metformin ER 1000mg  daily. She takes B12 supplement daily.    She admits that she hasn't been checking her sugar much since her last visit (since her A1c was so good).  She has maintained her healthy diet, limiting carbs, avoiding sugary drinks, watching her sweets (has some dark chocolate, small amounts). She gets regular exercise.  She denies polydipsia, polyuria, numbness, tingling. Last diabetic eye exam was 07/2019, no retinopathy. A1c was up to 7.4 in 03/2019, when only on 500mg  metformin daily, came down to 5.7 in 10/2019 on the 1000mg  dose.  At one point her BP was elevated,so lisinopril was started, but this was stopped when creatinine bumped up to 1.6.She also had some dyspnea and palpitations while on lisinopril, which resolvedafter its discontinuation, and her Cr returned to normal.  BP's have been normal since. BP Readings from Last 3 Encounters:  10/19/19 110/70  04/13/19 128/76  10/27/18 128/72   Lipids were excellentin the past,withvery high HDL, so notput onstatin (cardiologist agreedwith this recommendation).  LDL last year was 127. She has been compliant with taking her aspirin daily. She bruises easily, but denies any change.  Osteopenia: She has been taking alendronate since September 2016. She continues to [employer] "natural" calcium. Vitamin D level was normal on last check (39 in 01/2015).She gets regular weight-bearing exercise. Denies any side effects or dysphagia.  She underwent f/u DEXA 04/2019, showing improvement at left femoral neck.  Lowest was at L wrist,  T-1.9.  Anxiety and insomnia: Anxiety related to worrying about whether or not she can sleep, which is alleviated by having ambien around just in case. She has been using [place] as needed, and has no side effects or unusual behaviors. Only needs to take 1/2 tablet with good results, about 2-3x week, some weeks not at all. She has some travel anxiety,and will take it when she sleeps in a hotel. She also uses alprazolam for travel, flying.   Ambien and alprazolam were both refilled for #30 in 10/2019. She still has some left at home.  Immunization History  Administered Date(s) Administered  . Hepatitis A, Adult 09/16/2017, 03/24/2018  . Influenza Split 07/29/2012, 08/15/2013  . Influenza, High Dose Seasonal PF 06/15/2019  . Influenza,inj,Quad PF,6+ Mos 08/15/2015, 07/09/2016, 06/09/2017, 07/07/2018  . PFIZER SARS-COV-2 Vaccination 10/21/2019, 11/11/2019  . Pneumococcal Conjugate-13 02/18/2016  . Tdap 09/15/2008, 03/24/2018  . Zoster 02/07/2015  . Zoster Recombinat (Shingrix) 03/12/2017, 06/09/2017   Last Pap smear: 01/2015 of vaginal cuff--normal with no high risk HPV Last mammogram:04/2019, due now Last colonoscopy:04/2017 with Dr. Loletha Carrow, normal. Repeat 10 years. Last DEXA:  04/2019, showing improvement at left femoral neck.  Lowest was at L wrist, T-1.9. Dentist: twice yearly  Ophtho: yearly  Exercise: She is active in the yard, walking the dogs.Does yoga regularly. She walks in at least20 minute intervals throughout the day, 7 days/week. Gets 15K steps daily. Using weights 5x/week x 6 months.  Fall screen: None Depression screen: Negative Functional Status survey: slight hearing loss on left, otherwise unremarkable.  Other doctors caring for patient: Podiatrist--Dr. Regal Dentist--Dr. Sivils GI--Dr.  Danis Derm--Dr. Ubaldo Glassing Ortho: Dr. Alvan Dame (hip), Dr. Doran Durand (foot) Neurosurg: Dr. Aliene Beams surgeon: Dr. Amedeo Plenty (once for consult), also prev saw Dr. Daylene Katayama (for carpal tunnel  surgeries). Ophtho: Dr. Marica Otter (Dr. Anne Shutter did cataract surgery)  Patient has healthcare power of attorney and living will.  PMH, PSH, SH and FH were reviewed and updated  Outpatient Encounter Medications as of 05/02/2020  Medication Sig Note  . alendronate (FOSAMAX) 70 MG tablet TAKE ONE TABLET EVERY 7 DAYS WITH FULL GLASS OF WATER ON AN EMPTY STOMACH   . Ascorbic Acid (VITAMIN C PO) Take 500 mg by mouth daily.   Marland Kitchen aspirin EC 81 MG tablet Take 1 tablet (81 mg total) by mouth daily.   . Cyanocobalamin (B-12 PO) Take 1 tablet by mouth daily.   Marland Kitchen glucose blood test strip 1 each by Other route 2 (two) times daily. One Touch Verio Test Strips   . MAGNESIUM PO Take 1 tablet by mouth daily.    . metFORMIN (GLUCOPHAGE-XR) 500 MG 24 hr tablet TAKE TWO TABLETS EVERY DAY WITH BREAKFAST   . NON FORMULARY Take 2 capsules by mouth daily.  08/15/2015: BONE STRENGTH SUPPLEMENT (orders online)  . OneTouch Delica Lancets 91Y MISC 1 each by Does not apply route 2 (two) times daily.   Marland Kitchen zolpidem (AMBIEN) 5 MG tablet Take 1 tablet (5 mg total) by mouth at bedtime as needed for sleep. 1/2-1 tablet by mouth once daily as needed for insomnia   . ALPRAZolam (XANAX) 0.25 MG tablet Take 1-2 tablets (0.25-0.5 mg total) by mouth 3 (three) times daily as needed for anxiety. (Patient not taking: Reported on 05/02/2020)   . [DISCONTINUED] alendronate (FOSAMAX) 70 MG tablet TAKE ONE TABLET EVERY 7 DAYS WITH FULL GLASS OF WATER ON AN EMPTY STOMACH   . [DISCONTINUED] metFORMIN (GLUCOPHAGE-XR) 500 MG 24 hr tablet Take 2 tablets (1,000 mg total) by mouth daily with breakfast.    Facility-Administered Encounter Medications as of 05/02/2020  Medication  . 0.9 %  sodium chloride infusion   No Known Allergies  ROS: Denies weight changes, anorexia, dizziness, syncope, cough, swelling, vomiting, diarrhea, constipation, abdominal pain, melena, hematochezia, indigestion/heartburn, hematuria, incontinence, dysuria, vaginal  bleeding, discharge, odor or itch, genital lesions,numbness, tingling, weakness, tremor, suspicious skin lesions, depression, abnormal bleeding/bruising, or enlarged lymph nodes.  Intermittent insomnia, per HPI. Some dryness with intercourse, but does well with lubricant, tolerable. 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 "bad feet"--overall much better Congestion in the mornings, uses a decongestant in the mornings prn.   PHYSICAL EXAM:  BP 118/70   Pulse 72   Ht 5\' 7"  (1.702 m)   Wt 136 lb 3.2 oz (61.8 kg)   BMI 21.33 kg/m   Wt Readings from Last 3 Encounters:  05/02/20 136 lb 3.2 oz (61.8 kg)  10/19/19 130 lb 6.4 oz (59.1 kg)  04/13/19 137 lb (62.1 kg)    General Appearance:  Alert, cooperative, no distress, appears stated age or somewhat younger  Head:  Normocephalic, without obvious abnormality, atraumatic   Eyes:  PERRL, conjunctiva/corneas clear, EOM's intact, fundi benign   Ears:  Normal TM's and external ear canals   Nose:  Not examined (wearing mask due to COVID-19 pandemic)  Throat:  Not examined (wearing mask due to COVID-19 pandemic)  Neck:  Supple, no lymphadenopathy; thyroid: no enlargement/ tenderness/nodules; no carotid bruit or JVD   Back:  Spine nontender, no  curvature, ROM normal, no CVA tenderness   Lungs:  Clear to auscultation bilaterally without wheezes, rales or ronchi; respirations unlabored   Chest Wall:  No tenderness or deformity   Heart:  Regular rate and rhythm, S1 and S2 normal, no murmur, rub  or gallop   Breast Exam:  No tenderness, masses, or nipple discharge or inversion. No axillary lymphadenopathy. + implants, with some "crackling" sensation with palpation over the lateral/UOP aspects, unchanged.  Abdomen:  Soft, non-tender, nondistended, normoactive bowel sounds,  no masses, no  hepatosplenomegaly   Genitalia:  Normal external genitalia without lesions.Mild atrophic changes noted.BUS and vagina normal; No abnormal vaginal discharge. Uterus and ovaries are surgically absent, nontender, no mass. Normal appearing vaginal cuff.  Pap of vaginal cuff was performed.  Rectal:  Normal tone, no masses or tenderness; heme negative light brown stool   Extremities:  No clubbing, cyanosis or edema. Deviation of distal phalanges on 2nd through 4th fingers of R hand. Some inflammation and arthritic changes of DIPs on left as well, but no deviation noted. Nontender.  Pulses:  2+ and symmetric all extremities   Skin:  Skin color, texture, turgor normal, no rashes or lesions.   Lymph nodes:  Cervical, supraclavicular, and axillary nodes normal   Neurologic:  CNII-XII intact, normal strength, sensation and gait; reflexes 2+ and symmetric throughout    Psych: Normal mood, affect, hygiene and grooming  Normal diabetic foot exam  Lab Results  Component Value Date   HGBA1C 6.4 (H) 04/30/2020     Chemistry      Component Value Date/Time   NA 135 04/30/2020 0820   K 4.4 04/30/2020 0820   K 5.0 07/28/2012 1251   CL 100 04/30/2020 0820   CL 107 07/28/2012 1251   CO2 24 04/30/2020 0820   CO2 23 07/28/2012 1251   BUN 22 04/30/2020 0820   CREATININE 0.75 04/30/2020 0820   CREATININE 0.82 03/04/2017 0736      Component Value Date/Time   CALCIUM 9.8 04/30/2020 0820   CALCIUM 9.4 07/28/2012 1251   ALKPHOS 59 04/30/2020 0820   ALKPHOS 37 07/28/2012 1251   AST 17 04/30/2020 0820   AST 18 07/28/2012 1251   ALT 15 04/30/2020 0820   BILITOT 0.7 04/30/2020 0820   BILITOT 0.4 07/28/2012 1251     Fasting glu 145  Urine microalb/Cr ratio <4  Lab Results  Component Value Date   TSH 1.690 04/30/2020   Lab Results  Component Value Date   WBC 5.9 04/30/2020   HGB 14.2 04/30/2020   HCT 42.7 04/30/2020   MCV 95 04/30/2020   PLT 241  04/30/2020   Lab Results  Component Value Date   CHOL 279 (H) 04/30/2020   HDL 137 04/30/2020   LDLCALC 135 (H) 04/30/2020   TRIG 48 04/30/2020   CHOLHDL 2.0 04/30/2020    ASSESSMENT/PLAN:  Annual physical exam - Plan: Cytology - PAP([place])  Osteopenia, unspecified location - has been on alendronate x 5 years.  Elizebeth Koller out current rx (to finish in September). Recheck DEXA next year. Cont Ca, D, wt-bearing exercise  Need for pneumococcal vaccination - Plan: Pneumococcal polysaccharide vaccine 23-valent greater than or equal to 2yo subcutaneous/IM  History of cervical cancer - normal appearing vaginal cuff.  Pap done today - Plan: Cytology - PAP([place])  Type 2 diabetes mellitus without complication, without long-term current use of insulin (HCC) - A1c higher than last time, overall DM is still controlled. Cont 1000mg  Metformin, diet, exercise - Plan:  metFORMIN (GLUCOPHAGE-XR) 500 MG 24 hr tablet, rosuvastatin (CRESTOR) 10 MG tablet  Discussed her lipids in detail--excellent HDL, LDL somewhat higher than in past.  Discussed that all diabetics should be on statins--can start with Crestor 10mg  just once weekly.   Discussed monthly self breast exams and yearly mammograms; at least 30 minutes of aerobic activity at least 5 days/week ,weight bearing exercise at least 2x/wk; proper sunscreen use reviewed; healthy diet, including goals of calcium and vitamin D intake and alcohol recommendations (less than or equal to 1 drink/day) reviewed; regular seatbelt use; changing batteries in smoke detectors, carbon monoxide detector recommended. Immunization recommendations discussed--continue yearly flu shots (high dose). Pneumovax given today. Discussed possibility of COVID vaccine booster (8 months later, when approved/allowed). Colonoscopy UTD, due again 04/2027. DEXA next year.   F/u 6 months

## 2020-05-01 NOTE — Patient Instructions (Addendum)
  HEALTH MAINTENANCE RECOMMENDATIONS:  It is recommended that you get at least 30 minutes of aerobic exercise at least 5 days/week (for weight loss, you may need as much as 60-90 minutes). This can be any activity that gets your heart rate up. This can be divided in 10-15 minute intervals if needed, but try and build up your endurance at least once a week.  Weight bearing exercise is also recommended twice weekly.  Eat a healthy diet with lots of vegetables, fruits and fiber.  "Colorful" foods have a lot of vitamins (ie green vegetables, tomatoes, red peppers, etc).  Limit sweet tea, regular sodas and alcoholic beverages, all of which has a lot of calories and sugar.  Up to 1 alcoholic drink daily may be beneficial for women (unless trying to lose weight, watch sugars).  Drink a lot of water.  Calcium recommendations are 1200-1500 mg daily (1500 mg for postmenopausal women or women without ovaries), and vitamin D 1000 IU daily.  This should be obtained from diet and/or supplements (vitamins), and calcium should not be taken all at once, but in divided doses.  Monthly self breast exams and yearly mammograms for women over the age of 43 is recommended.  Sunscreen of at least SPF 30 should be used on all sun-exposed parts of the skin when outside between the hours of 10 am and 4 pm (not just when at beach or pool, but even with exercise, golf, tennis, and yard work!)  Use a sunscreen that says "broad spectrum" so it covers both UVA and UVB rays, and make sure to reapply every 1-2 hours.  Remember to change the batteries in your smoke detectors when changing your clock times in the spring and fall. Carbon monoxide detectors are recommended for your home.  Use your seat belt every time you are in a car, and please drive safely and not be distracted with cell phones and texting while driving.  You were given pneumovax today. Pay attention to the news to see if/when you will qualify for booster (hearing 8  months after completing the series, but not sure of details yet).  You can stop taking the alendronate once you finish your current prescription (that will be 5 years of treatment). We will recheck bone density next year.  Start taking rosuvastatin once weekly.  If you get side effects, you can add in Coenzyme Q10 (over-the-counter). Let us know if you have any side effects or issues with this.

## 2020-05-02 ENCOUNTER — Ambulatory Visit: Payer: Managed Care, Other (non HMO) | Admitting: Family Medicine

## 2020-05-02 ENCOUNTER — Encounter: Payer: Self-pay | Admitting: Family Medicine

## 2020-05-02 ENCOUNTER — Other Ambulatory Visit: Payer: Self-pay

## 2020-05-02 ENCOUNTER — Other Ambulatory Visit (HOSPITAL_COMMUNITY)
Admission: RE | Admit: 2020-05-02 | Discharge: 2020-05-02 | Disposition: A | Discharge status: Home / Self Care | Payer: Managed Care, Other (non HMO) | Source: Ambulatory Visit | Attending: Family Medicine | Admitting: Family Medicine

## 2020-05-02 VITALS — BP 118/70 | HR 72 | Ht 67.0 in | Wt 136.2 lb

## 2020-05-02 DIAGNOSIS — Z0000 Encounter for general adult medical examination without abnormal findings: Secondary | ICD-10-CM | POA: Diagnosis not present

## 2020-05-02 DIAGNOSIS — Z8541 Personal history of malignant neoplasm of cervix uteri: Secondary | ICD-10-CM | POA: Diagnosis not present

## 2020-05-02 DIAGNOSIS — Z23 Encounter for immunization: Secondary | ICD-10-CM

## 2020-05-02 DIAGNOSIS — M8580 Other specified disorders of bone density and structure, unspecified site: Secondary | ICD-10-CM | POA: Diagnosis not present

## 2020-05-02 DIAGNOSIS — E119 Type 2 diabetes mellitus without complications: Secondary | ICD-10-CM | POA: Diagnosis not present

## 2020-05-02 MED ORDER — METFORMIN HCL ER 500 MG PO TB24: ORAL_TABLET | ORAL | 1 refills | Status: DC

## 2020-05-02 MED ORDER — ROSUVASTATIN CALCIUM 10 MG PO TABS: 10.0000 mg | ORAL_TABLET | ORAL | 1 refills | Status: DC

## 2020-05-04 LAB — CYTOLOGY - PAP
Comment: NEGATIVE
Diagnosis: NEGATIVE
High risk HPV: NEGATIVE

## 2020-05-25 LAB — HM MAMMOGRAPHY

## 2020-05-29 ENCOUNTER — Telehealth: Payer: Self-pay | Admitting: *Deleted

## 2020-05-29 DIAGNOSIS — G4700 Insomnia, unspecified: Secondary | ICD-10-CM

## 2020-05-29 MED ORDER — ZOLPIDEM TARTRATE 5 MG PO TABS: 5.0000 mg | ORAL_TABLET | Freq: Every evening | ORAL | 0 refills | Status: DC | PRN

## 2020-05-29 NOTE — Telephone Encounter (Signed)
Patient called and asked if you could send an rx for [facility] generic [facility] to [facility] [place], thanks.

## 2020-06-06 ENCOUNTER — Encounter: Payer: Self-pay | Admitting: Family Medicine

## 2020-07-16 ENCOUNTER — Telehealth: Payer: Self-pay | Admitting: Family Medicine

## 2020-07-16 DIAGNOSIS — F419 Anxiety disorder, unspecified: Secondary | ICD-10-CM

## 2020-07-16 MED ORDER — ALPRAZOLAM 0.25 MG PO TABS: 0.2500 mg | ORAL_TABLET | Freq: Three times a day (TID) | ORAL | 0 refills | Status: DC | PRN

## 2020-07-16 NOTE — Telephone Encounter (Signed)
Pt called and left VM needs refill for her xanax please [facility] to [facility] [place], [place]

## 2020-08-01 LAB — HM DIABETES EYE EXAM

## 2020-08-16 ENCOUNTER — Encounter: Payer: Self-pay | Admitting: Family Medicine

## 2020-09-19 ENCOUNTER — Other Ambulatory Visit: Payer: Self-pay

## 2020-09-19 ENCOUNTER — Encounter: Payer: Self-pay | Admitting: Family Medicine

## 2020-09-19 ENCOUNTER — Other Ambulatory Visit (INDEPENDENT_AMBULATORY_CARE_PROVIDER_SITE_OTHER): Payer: Managed Care, Other (non HMO)

## 2020-09-19 ENCOUNTER — Telehealth: Payer: Managed Care, Other (non HMO) | Admitting: Family Medicine

## 2020-09-19 VITALS — Temp 97.1°F | Ht 67.0 in | Wt 137.0 lb

## 2020-09-19 DIAGNOSIS — R0981 Nasal congestion: Secondary | ICD-10-CM

## 2020-09-19 DIAGNOSIS — J029 Acute pharyngitis, unspecified: Secondary | ICD-10-CM | POA: Diagnosis not present

## 2020-09-19 LAB — POC COVID19 BINAXNOW: SARS Coronavirus 2 Ag: NEGATIVE

## 2020-09-19 NOTE — Progress Notes (Signed)
   Subjective:  Documentation for virtual audio and video telecommunications through Caregility encounter:  The patient was located at home. 2 patient identifiers used.  The provider was located in the office. The patient did consent to this visit and is aware of possible charges through their insurance for this visit.  The other persons participating in this telemedicine service were none. Time spent on call was 14 minutes and in review of previous records >20 minutes total.  This virtual service is not related to other E/M service within previous 7 days.    Patient ID: Tracey Santiago, female    DOB: 1956/03/10, 65 y.o.   MRN: [id number]  HPI Chief Complaint  Patient presents with  . Sore Throat    VIRTUAL sore throat, slightly congested (more than usual). Symptoms started yesterday.    Complains of a 2 day history of sore throat, mild aches, and nasal congestion.   Denies fever, chills, headache, dizziness, chest pain, palpitations, shortness of breath, abdominal pain, N/V/D.   States she has taken Mucinex.   She is fully vaccinated for Covid with her booster in October 2021.   Review of Systems Pertinent positives and negatives in the history of present illness.     Objective:   Physical Exam Temp (!) 97.1 F (36.2 C) (Oral)   Ht 5\' 7"  (1.702 m)   Wt 137 lb (62.1 kg)   BMI 21.46 kg/m   Alert and oriented in no acute distress.  Respirations unlabored.  Speaking in complete sentences without difficulty.      Assessment & Plan:  Acute pharyngitis, unspecified etiology - Plan: Novel Coronavirus, NAA ([facility]), POC COVID-19 BinaxNow  Nasal congestion - Plan: Novel Coronavirus, NAA ([facility]), POC COVID-19 BinaxNow  She will come to the office parking lot for rapid and PCR Covid testing.  Discussed symptomatic management.  Follow up pending results.

## 2020-09-20 LAB — SARS-COV-2, NAA 2 DAY TAT

## 2020-09-20 LAB — NOVEL CORONAVIRUS, NAA: SARS-CoV-2, NAA: NOT DETECTED

## 2020-10-15 ENCOUNTER — Other Ambulatory Visit: Payer: Self-pay | Admitting: Family Medicine

## 2020-10-15 DIAGNOSIS — F419 Anxiety disorder, unspecified: Secondary | ICD-10-CM

## 2020-10-15 DIAGNOSIS — G4700 Insomnia, unspecified: Secondary | ICD-10-CM

## 2020-10-15 NOTE — Telephone Encounter (Signed)
Pt. Requesting refill on Alprazolam and Zolpidem last apt 05/02/20 and next apt is 11/07/20. Alprazolam last filled 07/16/20 zolpidem last filled 05/29/20.

## 2020-11-01 ENCOUNTER — Telehealth: Payer: Self-pay

## 2020-11-01 ENCOUNTER — Telehealth: Payer: Self-pay | Admitting: *Deleted

## 2020-11-01 DIAGNOSIS — E119 Type 2 diabetes mellitus without complications: Secondary | ICD-10-CM

## 2020-11-01 DIAGNOSIS — Z5181 Encounter for therapeutic drug level monitoring: Secondary | ICD-10-CM

## 2020-11-01 DIAGNOSIS — E785 Hyperlipidemia, unspecified: Secondary | ICD-10-CM

## 2020-11-01 NOTE — Telephone Encounter (Signed)
PT. Called stating she has an med check on 11/07/20 at 2:30p.m. and she wanted to know if she could get the fasting blood work done for that apt. On 11/05/20. If ok by you could you put the order in and I will call pt. And let her know.

## 2020-11-01 NOTE — Telephone Encounter (Signed)
Adam sent me a separate message about this, earlier today

## 2020-11-01 NOTE — Telephone Encounter (Signed)
Orders entered

## 2020-11-01 NOTE — Telephone Encounter (Signed)
I noticed patient is on the lab schedule for Monday and there are no orders.

## 2020-11-02 NOTE — Telephone Encounter (Signed)
Pt AWARE 

## 2020-11-04 ENCOUNTER — Other Ambulatory Visit: Payer: Self-pay | Admitting: Family Medicine

## 2020-11-04 DIAGNOSIS — E119 Type 2 diabetes mellitus without complications: Secondary | ICD-10-CM

## 2020-11-05 ENCOUNTER — Other Ambulatory Visit: Payer: Self-pay

## 2020-11-05 ENCOUNTER — Other Ambulatory Visit: Payer: Medicare Other

## 2020-11-05 DIAGNOSIS — E785 Hyperlipidemia, unspecified: Secondary | ICD-10-CM

## 2020-11-05 DIAGNOSIS — E119 Type 2 diabetes mellitus without complications: Secondary | ICD-10-CM

## 2020-11-05 DIAGNOSIS — Z5181 Encounter for therapeutic drug level monitoring: Secondary | ICD-10-CM

## 2020-11-06 LAB — COMPREHENSIVE METABOLIC PANEL
ALT: 19 IU/L (ref 0–32)
AST: 19 IU/L (ref 0–40)
Albumin/Globulin Ratio: 2.1 (ref 1.2–2.2)
Albumin: 4.7 g/dL (ref 3.8–4.8)
Alkaline Phosphatase: 69 IU/L (ref 44–121)
BUN/Creatinine Ratio: 27 (ref 12–28)
BUN: 20 mg/dL (ref 8–27)
Bilirubin Total: 0.6 mg/dL (ref 0.0–1.2)
CO2: 22 mmol/L (ref 20–29)
Calcium: 10 mg/dL (ref 8.7–10.3)
Chloride: 100 mmol/L (ref 96–106)
Creatinine, Ser: 0.74 mg/dL (ref 0.57–1.00)
GFR calc Af Amer: 98 mL/min/{1.73_m2} (ref >59)
GFR calc non Af Amer: 85 mL/min/{1.73_m2} (ref >59)
Globulin, Total: 2.2 g/dL (ref 1.5–4.5)
Glucose: 161 mg/dL — ABNORMAL HIGH (ref 65–99)
Potassium: 4.6 mmol/L (ref 3.5–5.2)
Sodium: 140 mmol/L (ref 134–144)
Total Protein: 6.9 g/dL (ref 6.0–8.5)

## 2020-11-06 LAB — LIPID PANEL
Chol/HDL Ratio: 2 ratio (ref 0.0–4.4)
Cholesterol, Total: 270 mg/dL — ABNORMAL HIGH (ref 100–199)
HDL: 137 mg/dL (ref >39)
LDL Chol Calc (NIH): 125 mg/dL — ABNORMAL HIGH (ref 0–99)
Triglycerides: 53 mg/dL (ref 0–149)
VLDL Cholesterol Cal: 8 mg/dL (ref 5–40)

## 2020-11-06 LAB — HEMOGLOBIN A1C
Est. average glucose Bld gHb Est-mCnc: 160 mg/dL
Hgb A1c MFr Bld: 7.2 % — ABNORMAL HIGH (ref 4.8–5.6)

## 2020-11-06 NOTE — Progress Notes (Signed)
Chief Complaint  Patient presents with  . Diabetes    Nonfasting med check, labs already done. Wondering if she should still be taking 81mg  ASA every other day, has the recommendation changed?    Patient presents for 6 month follow-up on chronic problems. She had labs done prior to her visit, see below.  Diabetes: Diagnosed 07/2015 after 2 fasting sugars in the 140's-150's (A1cat that time was6.2).She continues on Metformin ER 1000mg  daily. She takes B12 supplement daily (told to by pharmacist, helped with nausea).  She splits the dosing of metformin (taking 500mg  BID). Last A1c was 6.4 in 04/2020. She hasn't been checking her sugars regularly.  She has maintained her healthy diet, limiting carbs, avoiding sugary drinks, watching her sweets (has some dark chocolate, small amounts). She has been eating more fruit. Diet reviewed: Breakfast-1/2 sourdough english muffin, 1 egg, fruit (a "healthy serving" of berries).  Has been eating more mandarin oranges in the winter. Peanut butter and apples for lunch. Chicken, salmon (3x/wk), caesar salad (no croutons).  Making sweet potato fries more than in the past. 0-2 glasses of wine/night, around 6/week.  She gets regular exercise. She denies polydipsia, polyuria, numbness, tingling. Last diabetic eye exam was 07/2020, no retinopathy.  At one point her BP was elevated,so lisinopril was started, but this was stopped when creatinine bumped up to 1.6.She also had some dyspnea and palpitations while on lisinopril, which resolvedafter its discontinuation, and her Cr returned to normal.BP's have been normal since. BP Readings from Last 3 Encounters:  05/02/20 118/70  10/19/19 110/70  04/13/19 128/76   Lipids were excellentin the past,withvery high HDL, so notput onstatin.  LDL last year was 135, and she was started on Crestor 10mg  once a week in 04/2020.  She is tolerating this without side effects.  Osteopenia: She completed 5 years  of alendronate (September 2016 through 05/2020). She continues to [employer] "natural" calcium. Vitamin D level was normal on last check (39 in 01/2015).She gets regular weight-bearing exercise. Denies any side effects or dysphagia. She underwent f/u DEXA 04/2019, showing improvement at left femoral neck. Lowest was at L wrist, T-1.9.  Anxiety and insomnia: Anxiety related to worrying about whether or not she can sleep, which is alleviated by having ambien around just in case. She has been using [place] as needed, and has no side effects or unusual behaviors. Only needs to take 1/2 tablet with good results, about 2-3x week, some weeks not at all. She has some travel anxiety,and will take it when she sleeps in a hotel. Shealso usesalprazolam for travel, flying (or if she wakes up at 4am and needs to go back to sleep, takes 1/2 tablet).   Ambien and alprazolam were both refilled for #30 in 09/2020. Prior to that, she last filled alprazolam in November, and prior to that was in 10/2019. Prior to 09/2020, last filled ambien 05/2020 and 10/2019.   PMH, PSH, SH reviewed  Outpatient Encounter Medications as of 11/07/2020  Medication Sig Note  . ALPRAZolam (XANAX) 0.25 MG tablet TAKE 1-2 TABLETS (0.25-0.5 MG TOTAL) BY MOUTH 3 (THREE) TIMES DAILY AS NEEDED FOR ANXIETY.   . Ascorbic Acid (VITAMIN C PO) Take 500 mg by mouth daily.   Marland Kitchen aspirin EC 81 MG tablet Take 1 tablet (81 mg total) by mouth daily. 09/19/2020: Every other day  . Cyanocobalamin (B-12 PO) Take 1 tablet by mouth daily.   Marland Kitchen glucose blood test strip 1 each by Other route 2 (two) times daily. One Touch  Verio Test Strips   . MAGNESIUM PO Take 1 tablet by mouth daily.   . metFORMIN (GLUCOPHAGE-XR) 500 MG 24 hr tablet TAKE TWO TABLETS EVERY DAY WITH BREAKFAST   . NON FORMULARY Take 2 capsules by mouth daily.  08/15/2015: BONE STRENGTH SUPPLEMENT (orders online)  . OneTouch Delica Lancets 55D MISC 1 each by Does not apply route 2 (two) times  daily.   . rosuvastatin (CRESTOR) 10 MG tablet Take 1 tablet (10 mg total) by mouth once a week.   . zolpidem (AMBIEN) 5 MG tablet TAKE 1 TABLET (5 MG TOTAL) BY MOUTH AT BEDTIME AS NEEDED FOR SLEEP. 1/2-1 TABLET BY MOUTH ONCE DAILY AS NEEDED FOR INSOMNIA   . dextromethorphan-guaiFENesin (MUCINEX DM) 30-600 MG 12hr tablet Take 1 tablet by mouth 2 (two) times daily. (Patient not taking: Reported on 11/07/2020) 09/19/2020: Took this morning at 7:00am   Facility-Administered Encounter Medications as of 11/07/2020  Medication  . 0.9 %  sodium chloride infusion   No Known Allergies  ROS:  No fever, chills, URI symptoms, headaches, dizziness, chest pain, palpitations, cough, shortness of breath, edema.  No bleeding or bruising (unchanged, stable). Moods are good. No GI or GU complaints.    PHYSICAL EXAM:  BP 128/80   Pulse 80   Ht 5\' 7"  (1.702 m)   Wt 135 lb 6.4 oz (61.4 kg)   BMI 21.21 kg/m   Wt Readings from Last 3 Encounters:  11/07/20 135 lb 6.4 oz (61.4 kg)  09/19/20 137 lb (62.1 kg)  05/02/20 136 lb 3.2 oz (61.8 kg)    Well-appearing, pleasant female, in good spirits. HEENT: conjunctiva and sclera are clear, EOMI. Wearing mask. Neck: no lymphadenopathy, thyromegaly or carotid bruit. Heart: regular rate and rhythm Lungs: clear bilaterally Back: no CVA or spinal tenderness Abdomen: soft, nontender, no organomegaly or mass Extremities: no edema, normal pulses. Arthritic bony abnormalities in both hands. Psych: normal mood, affect, hygiene and grooming Neuro: alert and oriented, normal gait   Lab Results  Component Value Date   HGBA1C 7.2 (H) 11/05/2020   Fasting glu 161    Chemistry      Component Value Date/Time   NA 140 11/05/2020 0831   K 4.6 11/05/2020 0831   K 5.0 07/28/2012 1251   CL 100 11/05/2020 0831   CL 107 07/28/2012 1251   CO2 22 11/05/2020 0831   CO2 23 07/28/2012 1251   BUN 20 11/05/2020 0831   CREATININE 0.74 11/05/2020 0831   CREATININE 0.82  03/04/2017 0736      Component Value Date/Time   CALCIUM 10.0 11/05/2020 0831   CALCIUM 9.4 07/28/2012 1251   ALKPHOS 69 11/05/2020 0831   ALKPHOS 37 07/28/2012 1251   AST 19 11/05/2020 0831   AST 18 07/28/2012 1251   ALT 19 11/05/2020 0831   BILITOT 0.6 11/05/2020 0831   BILITOT 0.4 07/28/2012 1251     Lab Results  Component Value Date   CHOL 270 (H) 11/05/2020   HDL 137 11/05/2020   LDLCALC 125 (H) 11/05/2020   TRIG 53 11/05/2020   CHOLHDL 2.0 11/05/2020    ASSESSMENT/PLAN:  Type 2 diabetes mellitus without complication, without long-term current use of insulin (HCC) - A1c above goal; Cont 1000mg  Metformin, exercise. Cut back on fruit. Monitor frequently with Freestyle Libre applied today - Plan: rosuvastatin (CRESTOR) 10 MG tablet, metFORMIN (GLUCOPHAGE-XR) 500 MG 24 hr tablet  Hyperlipidemia, unspecified hyperlipidemia type - excellent HDL.  On once weekly Crestor. LDL >100, but overall lipid panel  is good  Anxiety - well controlled, infrequent use of alprazolam  Insomnia, unspecified type - intermittent, uses ambien prn.  Recently refilled.  Freestyle Libre 2 monitor sample was applied at visit.  We helped her download the app onto her new android phone (she was struggling with this), and helped her get everything set up. Need to wait an hour before can check sugar, so she will do that when she gets home.  She elected not to increase her metformin at this time, but to use the Freestyle monitor, scanning frequently and making adjustments to her diet (eating protein before carbs, cutting back on fruit servings, etc). If sugars remain above goal, can increase the dose to 3/day prior to her next visit.  F/u 3 months for med check with A1c at visit. At that visit, decide if wants labs prior to CPE, and enter orders.  May stop aspirin (guidelines do not support her using aspirin daily).  I spent 65 minutes dedicated to the care of this patient, including pre-visit review of  records, face to face time, post-visit ordering of testing and documentation.

## 2020-11-07 ENCOUNTER — Ambulatory Visit: Payer: Managed Care, Other (non HMO) | Admitting: Family Medicine

## 2020-11-07 ENCOUNTER — Other Ambulatory Visit: Payer: Self-pay

## 2020-11-07 ENCOUNTER — Encounter: Payer: Self-pay | Admitting: Family Medicine

## 2020-11-07 VITALS — BP 128/80 | HR 80 | Ht 67.0 in | Wt 135.4 lb

## 2020-11-07 DIAGNOSIS — G4700 Insomnia, unspecified: Secondary | ICD-10-CM | POA: Diagnosis not present

## 2020-11-07 DIAGNOSIS — E785 Hyperlipidemia, unspecified: Secondary | ICD-10-CM

## 2020-11-07 DIAGNOSIS — F419 Anxiety disorder, unspecified: Secondary | ICD-10-CM

## 2020-11-07 DIAGNOSIS — E119 Type 2 diabetes mellitus without complications: Secondary | ICD-10-CM

## 2020-11-07 MED ORDER — ROSUVASTATIN CALCIUM 10 MG PO TABS: 10.0000 mg | ORAL_TABLET | ORAL | 1 refills | Status: DC

## 2020-11-07 MED ORDER — METFORMIN HCL ER 500 MG PO TB24: ORAL_TABLET | ORAL | 1 refills | Status: DC

## 2020-11-07 NOTE — Patient Instructions (Addendum)
Increase protein. Cut back on fruit servings, have more vegetables. Monitor the sugar, and if you can't get them down by making some tweaks to your diet/portions, then increase the metformin to 3 pills daily (and let me know so I can send in a new prescription for the higher quantity.  Use the FreeStyle [place] monitor over the next 2 weeks to help make changes in your diet to keep the sugars down.  Check sugars fasting, before and 2 hours after meals.  Hopefully, you'll see benefit in your sugars from making some changes in your diet (keep a food journal along with your readings, to help make sense of them).   I'm not sure if this will be covered by your insurance beyond the 2 week free trial.  If you're interested in continuing to use this, we can try and get prior authorization from your insurance.  Stay at the 2 pills of Metformin daily for now.  If sugars aren't improving, you can increase to 3/day.  Return in 3 months for another office visit.  We will check your A1c at your visit (no labs needed in advance).   You may stop taking daily aspirin

## 2020-11-14 ENCOUNTER — Other Ambulatory Visit: Payer: Self-pay | Admitting: Family Medicine

## 2020-11-14 DIAGNOSIS — E119 Type 2 diabetes mellitus without complications: Secondary | ICD-10-CM

## 2020-12-05 ENCOUNTER — Telehealth: Payer: Self-pay | Admitting: Family Medicine

## 2020-12-05 MED ORDER — GLUCOSE BLOOD VI STRP: 1.0000 | ORAL_STRIP | Freq: Two times a day (BID) | 2 refills | Status: DC

## 2020-12-05 NOTE — Telephone Encounter (Signed)
Pt left voicemail saying she needs a new prescription for the one touch Verio test strips called in to the [facility] in [employer] on [address]

## 2020-12-05 NOTE — Telephone Encounter (Signed)
Called in test strips. #100 with 2 refills. She feels liked the [facility] and she feels like she learned quite a bit. She loved being able to check as much as she wanted. She was able to get her am and mid-day sugars under control. If it wasn't going to be so expensive she would use it all the time! Thanks for letting her try it.

## 2020-12-05 NOTE — Telephone Encounter (Signed)
[place] for these.  See how she did with the Freestyle and if it ever worked, if she gained helpful information

## 2021-01-28 ENCOUNTER — Other Ambulatory Visit: Payer: Self-pay | Admitting: Family Medicine

## 2021-01-28 DIAGNOSIS — F419 Anxiety disorder, unspecified: Secondary | ICD-10-CM

## 2021-01-28 NOTE — Telephone Encounter (Signed)
Is this okay to refill? 

## 2021-02-06 ENCOUNTER — Ambulatory Visit: Payer: Managed Care, Other (non HMO) | Admitting: Family Medicine

## 2021-02-06 ENCOUNTER — Other Ambulatory Visit: Payer: Self-pay

## 2021-02-06 ENCOUNTER — Encounter: Payer: Self-pay | Admitting: Family Medicine

## 2021-02-06 ENCOUNTER — Other Ambulatory Visit: Payer: Self-pay | Admitting: *Deleted

## 2021-02-06 ENCOUNTER — Telehealth: Payer: Self-pay | Admitting: *Deleted

## 2021-02-06 VITALS — BP 128/70 | HR 84 | Ht 67.0 in | Wt 130.0 lb

## 2021-02-06 DIAGNOSIS — E119 Type 2 diabetes mellitus without complications: Secondary | ICD-10-CM

## 2021-02-06 DIAGNOSIS — N644 Mastodynia: Secondary | ICD-10-CM

## 2021-02-06 MED ORDER — METFORMIN HCL ER 500 MG PO TB24: ORAL_TABLET | ORAL | 0 refills | Status: DC

## 2021-02-06 NOTE — Telephone Encounter (Signed)
error 

## 2021-02-06 NOTE — Progress Notes (Signed)
Chief Complaint  Patient presents with  . Breast Pain    Right sided breast pain x few weeks. [facility] and she is not due for mammo and she is not due until Sept. [facility] made her an appt for July (she is pretty sure it is for diagnostic and u/s) needs doctor approval.    She has soreness at the lateral right breast, near her underarm. She uses weights, and wonders if it is related to exercise, though she uses the same weights on both sides. No change in exercise routine. Holds grandchildren with R arm.  Yesterday she noted breast discomfort during the day while at work, and became concerned. She denies any lump or mass on breast exam. Denies nipple discharge, skin dimpling, or other concerns.   PMH, PSH, SH reviewed  Outpatient Encounter Medications as of 02/06/2021  Medication Sig Note  . ALPRAZolam (XANAX) 0.25 MG tablet TAKE 1-2 TABLETS (0.25-0.5 MG TOTAL) BY MOUTH 3 (THREE) TIMES DAILY AS NEEDED FOR ANXIETY.   . Ascorbic Acid (VITAMIN C PO) Take 500 mg by mouth daily.   . Cyanocobalamin (B-12 PO) Take 1 tablet by mouth daily.   Marland Kitchen glucose blood test strip 1 each by Other route 2 (two) times daily. One Touch Verio Test Strips   . MAGNESIUM PO Take 1 tablet by mouth daily.   . NON FORMULARY Take 2 capsules by mouth daily.  08/15/2015: BONE STRENGTH SUPPLEMENT (orders online)  . OneTouch Delica Lancets 23N MISC 1 each by Does not apply route 2 (two) times daily.   . rosuvastatin (CRESTOR) 10 MG tablet Take 1 tablet (10 mg total) by mouth once a week.   . zolpidem (AMBIEN) 5 MG tablet TAKE 1 TABLET (5 MG TOTAL) BY MOUTH AT BEDTIME AS NEEDED FOR SLEEP. 1/2-1 TABLET BY MOUTH ONCE DAILY AS NEEDED FOR INSOMNIA   . [DISCONTINUED] metFORMIN (GLUCOPHAGE-XR) 500 MG 24 hr tablet TAKE 2 TABLETS BY MOUTH DAILY WITH BREAKFAST   . [DISCONTINUED] aspirin EC 81 MG tablet Take 1 tablet (81 mg total) by mouth daily. 09/19/2020: Every other day  . [DISCONTINUED] dextromethorphan-guaiFENesin (MUCINEX DM)  30-600 MG 12hr tablet Take 1 tablet by mouth 2 (two) times daily. (Patient not taking: Reported on 11/07/2020) 09/19/2020: Took this morning at 7:00am   Facility-Administered Encounter Medications as of 02/06/2021  Medication  . 0.9 %  sodium chloride infusion   No Known Allergies  ROS:  No fever, chills, URI symptoms, headaches, chest pain.  Breast pain as per HPI  PHYSICAL EXAM:  BP 128/70   Pulse 84   Ht 5\' 7"  (1.702 m)   Wt 130 lb (59 kg)   BMI 20.36 kg/m   Well-appearing, pleasant female, in no distress R breast:  + Implant, limits exam of tissue beneath. 2 nodules are noted along the inferior portion of the implant at R breast, (6 o'clock position), feels like a weakening or bulge of the implant.  Nontender. No masses noted, no focal tenderness on exam.  No axillary lymphadenopathy. No reproducibility of discomfort with muscle testing (pects, lats).  ASSESSMENT/PLAN:  Breast pain, right  Fax order to [facility] for diagnostic mammogram and [place] of R breast.  Dx R breast pain; nodule noted along inferior margin of implant (not the area of discomfort, separate finding).  Pt will call [facility] to look for cancellations for sooner appointment.

## 2021-02-18 ENCOUNTER — Encounter: Payer: Medicare Other | Admitting: Family Medicine

## 2021-03-03 NOTE — Progress Notes (Signed)
Chief Complaint  Patient presents with   Diabetes    Nonfasting med check. Is scheduled at [facility] for July 11th-not having breast pain consistently any longer so she is wondering if she should just wait until Sept for her regular mammo?    Patient presents for 3 month follow-up on diabetes.  Her A1c went up to 7.2% despite medication and diet compliance.  She had been limiting carbs, avoiding sugary drinks, watching her sweets (just small amounts of dark chocolate), but did reported eating more fruit.  She takes metformin ER 56m BID.  She elected not to increase the metformin dose, but to use Freestyle [place]monitor to try and figure out how to improve her sugars.  She tries to eat protein first, cut back even more on her carbs.  Morning sugars are better since she no longer has the bread or corn with dinner, and cutting out the evening chocolate. She also reduced her fruit intake.  Morning sugars are now 100-130, mostly under 120. After lunch sugars are 130-140. She gets regular exercise.  She denies polydipsia, polyuria, numbness, tingling. Last diabetic eye exam was 07/2020, no retinopathy. Lab Results  Component Value Date   HGBA1C 7.2 (H) 11/05/2020   Right breast pain, laterally.  She reports that she went to the beach, and the pain practically went away.  Pain seems to come and go.  She thinks it might be related to her exercise (weights).  It went away while at the beach, not using weights.  She has appt for 7/11.  Yearly is due 05/2021, she is wondering if she can cancel the R diagnostic and wait for her routine visit.   PMH, PSH, SH reviewed  Outpatient Encounter Medications as of 03/04/2021  Medication Sig Note   Ascorbic Acid (VITAMIN C PO) Take 500 mg by mouth daily.    Cyanocobalamin (B-12 PO) Take 1 tablet by mouth daily.    glucose blood test strip 1 each by Other route 2 (two) times daily. One Touch Verio Test Strips    MAGNESIUM PO Take 1 tablet by mouth daily.     metFORMIN (GLUCOPHAGE-XR) 500 MG 24 hr tablet TAKE 2 TABLETS BY MOUTH DAILY WITH BREAKFAST    NON FORMULARY Take 2 capsules by mouth daily.  08/15/2015: BONE STRENGTH SUPPLEMENT (orders online)   OneTouch Delica Lancets 393JMISC 1 each by Does not apply route 2 (two) times daily.    rosuvastatin (CRESTOR) 10 MG tablet Take 1 tablet (10 mg total) by mouth once a week.    zolpidem (AMBIEN) 5 MG tablet TAKE 1 TABLET (5 MG TOTAL) BY MOUTH AT BEDTIME AS NEEDED FOR SLEEP. 1/2-1 TABLET BY MOUTH ONCE DAILY AS NEEDED FOR INSOMNIA    ALPRAZolam (XANAX) 0.25 MG tablet TAKE 1-2 TABLETS (0.25-0.5 MG TOTAL) BY MOUTH 3 (THREE) TIMES DAILY AS NEEDED FOR ANXIETY. (Patient not taking: Reported on 03/04/2021)    Facility-Administered Encounter Medications as of 03/04/2021  Medication   0.9 %  sodium chloride infusion   No Known Allergies  ROS: no fever, chills, headaches, dizziness, chest pain, shortness of breath, numbness, tinging, edema, bleeding, bruising. Moods are good. R breast pain is intermittent. See HPI Some weight loss since cutting back further on carbs   PHYSICAL EXAM:  BP 120/70   Pulse 76   Ht 5' 7"  (1.702 m)   Wt 129 lb (58.5 kg)   BMI 20.20 kg/m   Wt Readings from Last 3 Encounters:  03/04/21 129 lb (58.5 kg)  02/06/21 130 lb (59 kg)  11/07/20 135 lb 6.4 oz (61.4 kg)   Well-appearing, pleasant female, in good spirits. HEENT: conjunctiva and sclera are clear, EOMI. Wearing mask. Neck: no lymphadenopathy, thyromegaly or carotid bruit. Heart: regular rate and rhythm Lungs: clear bilaterally Back: no CVA or spinal tenderness Extremities: no edema, normal pulses. Arthritic bony abnormalities in both hands. Psych: normal mood, affect, hygiene and grooming Neuro: alert and oriented, normal gait   Lab Results  Component Value Date   HGBA1C 6.3 (A) 03/04/2021     ASSESSMENT/PLAN:  Type 2 diabetes mellitus without complication, without long-term current use of insulin ([place]) -  improved control with additional dietary changes. Cont metformin - Plan: HgB A1c, Comprehensive metabolic panel, TSH, Microalbumin / creatinine urine ratio  Medication monitoring encounter - Plan: Comprehensive metabolic panel, CBC with Differential/Platelet  Hyperlipidemia, unspecified hyperlipidemia type - Plan: Lipid panel  Breast pain, right - intermittent, possibly related to use of weights. Rec stopping weights x 1 week; if pain resolves, okay to wait for Sept; if any pain persists, get July mammo  F/u as scheduled for CPE in August, with labs prior C-met, cbc, TSH, urine microalb, lipid Will be too soon for A1c to be repeated

## 2021-03-04 ENCOUNTER — Ambulatory Visit: Payer: Managed Care, Other (non HMO) | Admitting: Family Medicine

## 2021-03-04 ENCOUNTER — Encounter: Payer: Self-pay | Admitting: Family Medicine

## 2021-03-04 ENCOUNTER — Other Ambulatory Visit: Payer: Self-pay

## 2021-03-04 VITALS — BP 120/70 | HR 76 | Ht 67.0 in | Wt 129.0 lb

## 2021-03-04 DIAGNOSIS — Z5181 Encounter for therapeutic drug level monitoring: Secondary | ICD-10-CM | POA: Diagnosis not present

## 2021-03-04 DIAGNOSIS — E119 Type 2 diabetes mellitus without complications: Secondary | ICD-10-CM | POA: Diagnosis not present

## 2021-03-04 DIAGNOSIS — N644 Mastodynia: Secondary | ICD-10-CM | POA: Diagnosis not present

## 2021-03-04 DIAGNOSIS — E785 Hyperlipidemia, unspecified: Secondary | ICD-10-CM

## 2021-03-04 LAB — POCT GLYCOSYLATED HEMOGLOBIN (HGB A1C): Hemoglobin A1C: 6.3 % — AB (ref 4.0–5.6)

## 2021-03-04 NOTE — Patient Instructions (Signed)
You can re-try stopping using the weights.  If your breast pain completely resolves, you can wait until September. If you have persistent pain in the right breast, get the mammogram as planned in July.

## 2021-03-09 ENCOUNTER — Other Ambulatory Visit: Payer: Self-pay | Admitting: Family Medicine

## 2021-03-09 DIAGNOSIS — G4700 Insomnia, unspecified: Secondary | ICD-10-CM

## 2021-03-11 NOTE — Telephone Encounter (Signed)
Is this okay to refill? 

## 2021-03-25 LAB — HM MAMMOGRAPHY

## 2021-03-27 ENCOUNTER — Encounter: Payer: Self-pay | Admitting: *Deleted

## 2021-05-09 ENCOUNTER — Other Ambulatory Visit: Payer: Self-pay | Admitting: *Deleted

## 2021-05-09 DIAGNOSIS — E119 Type 2 diabetes mellitus without complications: Secondary | ICD-10-CM

## 2021-05-09 MED ORDER — ROSUVASTATIN CALCIUM 10 MG PO TABS: 10.0000 mg | ORAL_TABLET | ORAL | 0 refills | Status: DC

## 2021-05-10 ENCOUNTER — Other Ambulatory Visit: Payer: Medicare Other

## 2021-05-10 DIAGNOSIS — E119 Type 2 diabetes mellitus without complications: Secondary | ICD-10-CM

## 2021-05-10 DIAGNOSIS — Z5181 Encounter for therapeutic drug level monitoring: Secondary | ICD-10-CM

## 2021-05-10 DIAGNOSIS — E785 Hyperlipidemia, unspecified: Secondary | ICD-10-CM

## 2021-05-12 LAB — LIPID PANEL
Chol/HDL Ratio: 1.8 ratio (ref 0.0–4.4)
Cholesterol, Total: 240 mg/dL — ABNORMAL HIGH (ref 100–199)
HDL: 137 mg/dL (ref >39)
LDL Chol Calc (NIH): 94 mg/dL (ref 0–99)
Triglycerides: 52 mg/dL (ref 0–149)
VLDL Cholesterol Cal: 9 mg/dL (ref 5–40)

## 2021-05-12 LAB — COMPREHENSIVE METABOLIC PANEL
ALT: 13 IU/L (ref 0–32)
AST: 16 IU/L (ref 0–40)
Albumin/Globulin Ratio: 2.5 — ABNORMAL HIGH (ref 1.2–2.2)
Albumin: 4.8 g/dL (ref 3.8–4.8)
Alkaline Phosphatase: 69 IU/L (ref 44–121)
BUN/Creatinine Ratio: 30 — ABNORMAL HIGH (ref 12–28)
BUN: 23 mg/dL (ref 8–27)
Bilirubin Total: 0.7 mg/dL (ref 0.0–1.2)
CO2: 22 mmol/L (ref 20–29)
Calcium: 10 mg/dL (ref 8.7–10.3)
Chloride: 100 mmol/L (ref 96–106)
Creatinine, Ser: 0.76 mg/dL (ref 0.57–1.00)
Globulin, Total: 1.9 g/dL (ref 1.5–4.5)
Glucose: 147 mg/dL — ABNORMAL HIGH (ref 65–99)
Potassium: 4.9 mmol/L (ref 3.5–5.2)
Sodium: 139 mmol/L (ref 134–144)
Total Protein: 6.7 g/dL (ref 6.0–8.5)
eGFR: 86 mL/min/{1.73_m2} (ref >59)

## 2021-05-12 LAB — CBC WITH DIFFERENTIAL/PLATELET
Basophils Absolute: 0.1 10*3/uL (ref 0.0–0.2)
Basos: 1 %
EOS (ABSOLUTE): 0.1 10*3/uL (ref 0.0–0.4)
Eos: 2 %
Hematocrit: 41.3 % (ref 34.0–46.6)
Hemoglobin: 13.7 g/dL (ref 11.1–15.9)
Immature Grans (Abs): 0 10*3/uL (ref 0.0–0.1)
Immature Granulocytes: 0 %
Lymphocytes Absolute: 1.7 10*3/uL (ref 0.7–3.1)
Lymphs: 29 %
MCH: 31.6 pg (ref 26.6–33.0)
MCHC: 33.2 g/dL (ref 31.5–35.7)
MCV: 95 fL (ref 79–97)
Monocytes Absolute: 0.6 10*3/uL (ref 0.1–0.9)
Monocytes: 10 %
Neutrophils Absolute: 3.3 10*3/uL (ref 1.4–7.0)
Neutrophils: 58 %
Platelets: 231 10*3/uL (ref 150–450)
RBC: 4.33 x10E6/uL (ref 3.77–5.28)
RDW: 11.9 % (ref 11.7–15.4)
WBC: 5.7 10*3/uL (ref 3.4–10.8)

## 2021-05-12 LAB — MICROALBUMIN / CREATININE URINE RATIO
Creatinine, Urine: 80.3 mg/dL
Microalb/Creat Ratio: 5 mg/g creat (ref 0–29)
Microalbumin, Urine: 3.9 ug/mL

## 2021-05-12 LAB — TSH: TSH: 1.32 u[IU]/mL (ref 0.450–4.500)

## 2021-05-12 NOTE — Patient Instructions (Addendum)
  HEALTH MAINTENANCE RECOMMENDATIONS:  It is recommended that you get at least 30 minutes of aerobic exercise at least 5 days/week (for weight loss, you may need as much as 60-90 minutes). This can be any activity that gets your heart rate up. This can be divided in 10-15 minute intervals if needed, but try and build up your endurance at least once a week.  Weight bearing exercise is also recommended twice weekly.  Eat a healthy diet with lots of vegetables, fruits and fiber.  "Colorful" foods have a lot of vitamins (ie green vegetables, tomatoes, red peppers, etc).  Limit sweet tea, regular sodas and alcoholic beverages, all of which has a lot of calories and sugar.  Up to 1 alcoholic drink daily may be beneficial for women (unless trying to lose weight, watch sugars).  Drink a lot of water.  Calcium recommendations are 1200-1500 mg daily (1500 mg for postmenopausal women or women without ovaries), and vitamin D 1000 IU daily.  This should be obtained from diet and/or supplements (vitamins), and calcium should not be taken all at once, but in divided doses.  Monthly self breast exams and yearly mammograms for women over the age of 37 is recommended.  Sunscreen of at least SPF 30 should be used on all sun-exposed parts of the skin when outside between the hours of 10 am and 4 pm (not just when at beach or pool, but even with exercise, golf, tennis, and yard work!)  Use a sunscreen that says "broad spectrum" so it covers both UVA and UVB rays, and make sure to reapply every 1-2 hours.  Remember to change the batteries in your smoke detectors when changing your clock times in the spring and fall. Carbon monoxide detectors are recommended for your home.  Use your seat belt every time you are in a car, and please drive safely and not be distracted with cell phones and texting while driving.  Please get [place] copies of your living will and healthcare power of attorney at your convenience, so we can get them  scanned into your chart.  Pay attention to the news for when the new COVID vaccine comes out (later this fall, with better coverage for the variants).  Try claritin or zyrtec for morning congestion, since you suggested possible allergy.  Use sudafed (decongestant) sparingly, if any sinus pain or antihistamines are ineffective.

## 2021-05-12 NOTE — Progress Notes (Signed)
Chief Complaint  Patient presents with   Medicare Wellness    Nonfasting AWV/CPE with pelvic. DEXA is scheduled for 06/01/21. Is going to wait on HD flu shot until Oct.     Tracey Santiago is a 65 y.o. female who presents for a complete physical and f/u on chronic problems.  (She is not yet on Medicare, only Part A). See below for labs done prior to her visit.  She is asking for prescription for Latisse, has gotten from Dr. Sabra Heck in the past. Has thinning/loss of eyelashes.  It works well, needs refill.  Gets generic (not covered, but affordable).  Diabetes.  Her A1c went up to 7.2% in 10/2020, despite medication and diet compliance. She used [employer] monitor to try and figure out how to improve her sugars. She improved sugars by eating protein first, cut back further on carbs in her diet (cut out bread, corn with dinner, and evening chocolate), and reducing fruit intake. A1c was down to 6.3% on last check in 02/2021. She reports compliance with metformin ER '500mg'$  BID, and denies side effects. Sugars are now running 120-130 fasting (up to 140) in the morning, not really checking other times of day.  Today she checked 2 hours after breakfast, 119.  She gets regular exercise.  She denies polydipsia, polyuria, numbness, tingling. Checks feet regularly, no concerns. Last diabetic eye exam was 07/2020, no retinopathy. Lab Results  Component Value Date   HGBA1C 6.3 (A) 03/04/2021    At one point her BP was elevated, so lisinopril was started, but this was stopped when creatinine bumped up to 1.6. She also had some dyspnea and palpitations while on lisinopril, which resolved after its discontinuation, and her Cr returned to normal.  BP's have been normal since. BP Readings from Last 3 Encounters:  03/04/21 120/70  02/06/21 128/70  11/07/20 128/80    Lipids were excellent in the past, with very high HDL, so not put on statin.  When LDL went up to 135, she was started on Crestor '10mg'$   once a week in 04/2020.  She is tolerating this without side effects, and lipids are at goal.   Osteopenia:  She completed 5 years of alendronate (September 2016 through 05/2020). She continues to take Bone Strength "natural" calcium.  Vitamin D level was normal on last check (39 in 01/2015). She gets regular weight-bearing exercise.  She underwent f/u DEXA 04/2019, showing improvement at left femoral neck. Lowest was at L wrist, T-1.9. She is scheduled for DEX[facility] in 05/2021 at [facility].   Anxiety and insomnia:  Anxiety related to worrying about whether or not she can sleep, which is alleviated by having ambien around just in case. She occasionally has some anxiety in the middle of the night.  Depending on how close it is to morning, she will either take 1/2 [place] or 1/2 xanax. She only rarely needs to take it in order to get to sleep. She has no side effects or unusual behaviors.  Only needs to take 1/2 tablet with good results, about 2-3x week, some weeks not at all. She has some travel anxiety,and will take it when she sleeps in a hotel. She also uses alprazolam for travel, flying (or if she wakes up at 4am and needs to go back to sleep, takes 1/2 tablet).    Ambien was last filled for #30 03/11/21, and alprazolam was filled for #30 01/29/2021.    She had been seen with R breast pain, which seemed to come  and go, wasn't sure if related to use of weight/exercise.  She had normal diagnostic bilateral mammogram at [facility] in July (due to her symptom of pain, had done a little earlier than her usual exam). Pain resolved.  Immunization History  Administered Date(s) Administered   Hepatitis A, Adult 09/16/2017, 03/24/2018   Influenza Split 07/29/2012, 08/15/2013   Influenza, High Dose Seasonal PF 06/15/2019   Influenza,inj,Quad PF,6+ Mos 08/15/2015, 07/09/2016, 06/09/2017, 07/07/2018   PFIZER Comirnaty(Gray Top)Covid-19 Tri-Sucrose Vaccine 01/30/2021   PFIZER(Purple Top)SARS-COV-2 Vaccination 10/21/2019,  11/11/2019   Pneumococcal Conjugate-13 02/18/2016   Pneumococcal Polysaccharide-23 05/02/2020   Tdap 09/15/2008, 03/24/2018   Zoster Recombinat (Shingrix) 03/12/2017, 06/09/2017   Zoster, Live 02/07/2015   Pt states had 2 boosters Last Pap smear: 04/2020 of vaginal cuff--normal with no high risk HPV Last mammogram: 03/2021 Last colonoscopy: 04/2017 with Dr. Loletha Carrow, normal. Repeat 10 years. Last DEXA:  04/2019, showing improvement at left femoral neck.  Lowest was at L wrist, T-1.9. Dentist: twice yearly   Ophtho: yearly  Exercise:  She is active in the yard. Does yoga regularly (5x/week). She walks in at least 20 minute intervals throughout the day, 7 days/week. Gets 15K steps daily. Using weights 5x/week   Fall screen: None Depression screen: Negative Functional Status survey: some trouble with decisions, has diarrhea in the mornings. She has living will and healthcare power of attorney; we do not have this on file.  Other doctors caring for patient: Podiatrist--Dr. Regal Dentist--Dr. Jerilynn Birkenhead GI--Dr. Loletha Carrow Derm--Dr. Haverstock Ortho: Dr. Alvan Dame (hip), Dr. Doran Durand (foot) Neurosurg: Dr. Aliene Beams surgeon: Dr. Amedeo Plenty (once for consult), also prev saw Dr. Daylene Katayama (for carpal tunnel surgeries). Ophtho: Dr. Marica Otter (Dr. Anne Shutter did cataract surgery)   PMH, PSH, SH and FH were reviewed and updated  Outpatient Encounter Medications as of 05/13/2021  Medication Sig Note   Ascorbic Acid (VITAMIN C PO) Take 500 mg by mouth daily.    Cyanocobalamin (B-12 PO) Take 1 tablet by mouth daily.    glucose blood test strip 1 each by Other route 2 (two) times daily. One Touch Verio Test Strips    MAGNESIUM PO Take 1 tablet by mouth daily.    metFORMIN (GLUCOPHAGE-XR) 500 MG 24 hr tablet TAKE 2 TABLETS BY MOUTH DAILY WITH BREAKFAST    NON FORMULARY Take 2 capsules by mouth daily.  08/15/2015: BONE STRENGTH SUPPLEMENT (orders online)   OneTouch Delica Lancets 99991111 MISC 1 each by Does not apply route  2 (two) times daily.    rosuvastatin (CRESTOR) 10 MG tablet Take 1 tablet (10 mg total) by mouth once a week.    zolpidem (AMBIEN) 5 MG tablet TAKE 1/2 TO 1 TABLET BY MOUTH AT BEDTIME AS NEEDED FOR SLEEP/INSOMNIA    ALPRAZolam (XANAX) 0.25 MG tablet TAKE 1-2 TABLETS (0.25-0.5 MG TOTAL) BY MOUTH 3 (THREE) TIMES DAILY AS NEEDED FOR ANXIETY. (Patient not taking: No sig reported)    [DISCONTINUED] rosuvastatin (CRESTOR) 10 MG tablet Take 1 tablet (10 mg total) by mouth once a week.    Facility-Administered Encounter Medications as of 05/13/2021  Medication   0.9 %  sodium chloride infusion   No Known Allergies   ROS: Denies weight changes, anorexia, dizziness, syncope, cough, swelling, vomiting, diarrhea, constipation, abdominal pain, melena, hematochezia, indigestion/heartburn, hematuria, incontinence, dysuria, vaginal bleeding, discharge, odor or itch, genital lesions,numbness, tingling, weakness, tremor, suspicious skin lesions, depression, abnormal bleeding/bruising, or enlarged lymph nodes.   Intermittent insomnia, per HPI. Some dryness with intercourse, but does well with lubricant, tolerable. 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 Congestion in the mornings, uses a decongestant in the mornings prn.   PHYSICAL EXAM:  BP 130/80   Pulse 80   Ht 5' 6.5" (1.689 m)   Wt 130 lb (59 kg)   BMI 20.67 kg/m   Wt Readings from Last 3 Encounters:  05/13/21 130 lb (59 kg)  03/04/21 129 lb (58.5 kg)  02/06/21 130 lb (59 kg)    General Appearance:    Alert, cooperative, no distress, appears stated age   Head:    Normocephalic, without obvious abnormality, atraumatic     Eyes:    PERRL, conjunctiva/corneas clear, EOM's intact, fundi benign   Ears:    Normal TM's and external ear canals     Nose:    Not examined (wearing mask due to COVID-19 pandemic)   Throat:    Not examined (wearing  mask due to COVID-19 pandemic)  Neck:    Supple, no lymphadenopathy; thyroid: no enlargement/ tenderness/nodules; no carotid bruit or JVD     Back:    Spine nontender, no curvature, ROM normal, no CVA tenderness    Lungs:    Clear to auscultation bilaterally without wheezes, rales or ronchi; respirations unlabored     Chest Wall:    No tenderness or deformity     Heart:    Regular rate and rhythm, S1 and S2 normal, no murmur, rub   or gallop     Breast Exam:    No tenderness, masses, or nipple discharge or inversion. No axillary lymphadenopathy. + implants, with some "crackling" sensation with palpation over the lateral/UOP aspects, unchanged.  Abdomen:    Soft, non-tender, nondistended, normoactive bowel sounds,   no masses, no hepatosplenomegaly     Genitalia:    Normal external genitalia without lesions. Mild atrophic changes noted. BUS and vagina normal; No abnormal vaginal discharge. Uterus and ovaries are surgically absent, nontender, no mass.   Rectal:    Normal tone, no masses or tenderness; heme negative light brown stool     Extremities:    No clubbing, cyanosis or edema. Deviation of distal phalanges on 2nd through 4th fingers of R hand. Some inflammation and arthritic changes of DIPs on left as well, but no deviation noted. Nontender.  Pulses:    2+ and symmetric all extremities     Skin:    Skin color, texture, turgor normal, no rashes or lesions.   Lymph nodes:    Cervical, supraclavicular, and axillary nodes normal     Neurologic:    Normal strength, sensation and gait; reflexes 2+ and symmetric throughout                       Psych:    Normal mood, affect, hygiene and grooming   Normal diabetic foot exam    Chemistry      Component Value Date/Time   NA 139 05/10/2021 0851   K 4.9 05/10/2021 0851   K 5.0 07/28/2012 1251   CL 100 05/10/2021 0851   CL 107 07/28/2012 1251   CO2 22 05/10/2021 0851   CO2 23 07/28/2012 1251   BUN 23 05/10/2021 0851   CREATININE 0.76  05/10/2021 0851   CREATININE 0.82 03/04/2017 0736      Component Value Date/Time   CALCIUM 10.0 05/10/2021 0851   CALCIUM 9.4 07/28/2012 1251   ALKPHOS 69 05/10/2021 0851  ALKPHOS 37 07/28/2012 1251   AST 16 05/10/2021 0851   AST 18 07/28/2012 1251   ALT 13 05/10/2021 0851   BILITOT 0.7 05/10/2021 0851   BILITOT 0.4 07/28/2012 1251     Fasting glu 147  Urine microalb/Cr ratio 5  Lab Results  Component Value Date   TSH 1.320 05/10/2021   Lab Results  Component Value Date   WBC 5.7 05/10/2021   HGB 13.7 05/10/2021   HCT 41.3 05/10/2021   MCV 95 05/10/2021   PLT 231 05/10/2021   Lab Results  Component Value Date   CHOL 240 (H) 05/10/2021   HDL 137 05/10/2021   LDLCALC 94 05/10/2021   TRIG 52 05/10/2021   CHOLHDL 1.8 05/10/2021     ASSESSMENT/PLAN:  Annual physical exam  Osteopenia, unspecified location - DEXA due. Cont Ca, D, weight-bearing exercise  Pure hypercholesterolemia - at goal on Crestor once weekly, continue  Hypotrichosis of eyelid, unspecified laterality - Rx'd Latisse - Plan: bimatoprost (LATISSE) 0.03 % ophthalmic solution  Type 2 diabetes mellitus without complication, without long-term current use of insulin (HCC) - Controlled on metformin, cont current diet, exercise.  - Plan: metFORMIN (GLUCOPHAGE-XR) 500 MG 24 hr tablet   Discussed monthly self breast exams and yearly mammograms; at least 30 minutes of aerobic activity at least 5 days/week ,weight bearing exercise at least 2x/wk; proper sunscreen use reviewed; healthy diet, including goals of calcium and vitamin D intake and alcohol recommendations (less than or equal to 1 drink/day) reviewed; regular seatbelt use; changing batteries in smoke detectors, carbon monoxide detector recommended. Immunization recommendations discussed--continue yearly flu shots (high dose)--declined today, prefers to wait until later.  Updated COVID vaccine recommended when available later this Fall.  Colonoscopy UTD,  due again 04/2027. DEXA due now, scheduled for September.   F/u 4 mos-5 mos for med check

## 2021-05-13 ENCOUNTER — Encounter: Payer: Self-pay | Admitting: Family Medicine

## 2021-05-13 ENCOUNTER — Ambulatory Visit: Payer: Managed Care, Other (non HMO) | Admitting: Family Medicine

## 2021-05-13 ENCOUNTER — Other Ambulatory Visit: Payer: Self-pay

## 2021-05-13 VITALS — BP 130/80 | HR 80 | Ht 66.5 in | Wt 130.0 lb

## 2021-05-13 DIAGNOSIS — M8580 Other specified disorders of bone density and structure, unspecified site: Secondary | ICD-10-CM

## 2021-05-13 DIAGNOSIS — E7800 Pure hypercholesterolemia, unspecified: Secondary | ICD-10-CM

## 2021-05-13 DIAGNOSIS — H02729 Madarosis of unspecified eye, unspecified eyelid and periocular area: Secondary | ICD-10-CM | POA: Diagnosis not present

## 2021-05-13 DIAGNOSIS — Z0000 Encounter for general adult medical examination without abnormal findings: Secondary | ICD-10-CM

## 2021-05-13 DIAGNOSIS — E119 Type 2 diabetes mellitus without complications: Secondary | ICD-10-CM | POA: Diagnosis not present

## 2021-05-13 MED ORDER — METFORMIN HCL ER 500 MG PO TB24: ORAL_TABLET | ORAL | 1 refills | Status: DC

## 2021-05-13 MED ORDER — BIMATOPROST 0.03 % EX SOLN: CUTANEOUS | 12 refills | Status: DC

## 2021-06-03 LAB — HM DEXA SCAN

## 2021-06-04 ENCOUNTER — Other Ambulatory Visit: Payer: Self-pay | Admitting: Family Medicine

## 2021-06-04 DIAGNOSIS — G4700 Insomnia, unspecified: Secondary | ICD-10-CM

## 2021-06-04 DIAGNOSIS — F419 Anxiety disorder, unspecified: Secondary | ICD-10-CM

## 2021-06-05 ENCOUNTER — Encounter: Payer: Self-pay | Admitting: *Deleted

## 2021-07-22 ENCOUNTER — Other Ambulatory Visit: Payer: Self-pay | Admitting: Family Medicine

## 2021-07-22 DIAGNOSIS — E119 Type 2 diabetes mellitus without complications: Secondary | ICD-10-CM

## 2021-09-03 LAB — HM DIABETES EYE EXAM

## 2021-10-11 ENCOUNTER — Other Ambulatory Visit: Payer: Self-pay | Admitting: Family Medicine

## 2021-10-11 DIAGNOSIS — E119 Type 2 diabetes mellitus without complications: Secondary | ICD-10-CM

## 2021-10-13 NOTE — Progress Notes (Signed)
Chief Complaint  Patient presents with   Diabetes    Nonfasting med check. No concerns.    She is complaining that her L nostril runs a lot.  Once took an antihistamine (thinks Claritin), and was puffy in the face the next day.  Takes sudafed PE occasionally (if going out). She denies sinus pain, sore throat, or discolored mucus.  She notes it more at home than when she is out, wonders if the paints she uses could contribute.   Diabetes.  Her A1c went up to 7.2% in 10/2020, despite medication and diet compliance. She used [employer] monitor to try and figure out how to improve her sugars. She improved sugars by eating protein first, cut back further on carbs in her diet (cut out bread, corn with dinner, and evening chocolate), and reducing fruit intake. A1c was down to 6.3% on last check in 02/2021. Due for recheck today. She was good over Christmas, denies changes to her diet. She reports compliance with metformin ER 500mg  BID, and denies side effects. Sugars are running 120-130 fasting, 2 hr PP 108-160.  She gets regular exercise.  She denies polydipsia, polyuria, numbness, tingling. Checks feet regularly, no concerns. Last diabetic eye exam was 08/2021, no retinopathy.  At one point her BP was elevated, so lisinopril was started, but this was stopped when creatinine bumped up to 1.6. She also had some dyspnea and palpitations while on lisinopril, which resolved after its discontinuation, and her Cr returned to normal.  BP's have been normal since.  Urine microalbumin/Cr ratio was 5 in 04/2021. BP Readings from Last 3 Encounters:  10/14/21 128/78  05/13/21 130/80  03/04/21 120/70   Lab Results  Component Value Date   CREATININE 0.76 05/10/2021    Lipids were excellent in the past, with very high HDL, so not put on statin initially.  When LDL went up to 135, she was started on Crestor 10mg  once a week in 04/2020.  She is tolerating this without side effects, and lipids are at goal. Lab  Results  Component Value Date   CHOL 240 (H) 05/10/2021   HDL 137 05/10/2021   LDLCALC 94 05/10/2021   TRIG 52 05/10/2021   CHOLHDL 1.8 05/10/2021     Osteopenia:  She completed 5 years of alendronate (September 2016 through 05/2020). She continues to take Bone Strength "natural" calcium.  Vitamin D level was normal on last check (39 in 01/2015). She gets regular weight-bearing exercise.  Last DEXA was 05/2021, with lowest measurement at L femoral neck, T-2.1, stable.  Slight decline noted at spine (T-1.7).   Anxiety and insomnia:  Anxiety related to worrying about whether or not she can sleep, which is alleviated by having ambien around just in case. She rarely needs medication to get to sleep, but will sometimes wake up in the middle of the night, feeling anxious. Depending on how close it is to morning, she will either take 1/2 [place] or 1/2 xanax. She has no side effects or unusual behaviors.  Only needs to take 1/2 tablet with good results, about 2-3x week, some weeks not at all. She also has anxiety related to travel (flying, trouble sleeping in hotels). Ambien and alprazolam were both refilled last in 05/2021 gor #30. She still has some left at home, will need a refill within the next few weeks.    PMH, PSH, SH reviewed  Outpatient Encounter Medications as of 10/14/2021  Medication Sig Note   Ascorbic Acid (VITAMIN C PO) Take 500  mg by mouth daily.    bimatoprost (LATISSE) 0.03 % ophthalmic solution Place one drop on applicator and apply evenly along the skin of the upper eyelid at base of eyelashes once daily at bedtime; repeat procedure for second eye (use a clean applicator).    Cyanocobalamin (B-12 PO) Take 1 tablet by mouth daily.    glucose blood test strip 1 each by Other route 2 (two) times daily. One Touch Verio Test Strips    MAGNESIUM PO Take 1 tablet by mouth daily.    NON FORMULARY Take 2 capsules by mouth daily.  08/15/2015: BONE STRENGTH SUPPLEMENT (orders online)    OneTouch Delica Lancets 98X MISC 1 each by Does not apply route 2 (two) times daily.    rosuvastatin (CRESTOR) 10 MG tablet TAKE 1 TABLET (10 MG TOTAL) BY MOUTH ONCE A WEEK.    [DISCONTINUED] metFORMIN (GLUCOPHAGE-XR) 500 MG 24 hr tablet TAKE 2 TABLETS BY MOUTH DAILY WITH BREAKFAST    ALPRAZolam (XANAX) 0.25 MG tablet TAKE 1-2 TABLETS (0.25-0.5 MG TOTAL) BY MOUTH 3 (THREE) TIMES DAILY AS NEEDED FOR ANXIETY. (Patient not taking: Reported on 10/14/2021)    metFORMIN (GLUCOPHAGE-XR) 500 MG 24 hr tablet TAKE 2 TABLETS BY MOUTH DAILY WITH BREAKFAST    zolpidem (AMBIEN) 5 MG tablet TAKE 1/2 TO 1 TABLET BY MOUTH AT BEDTIME AS NEEDED FOR SLEEP/INSOMNIA (Patient not taking: Reported on 10/14/2021) 10/14/2021: Couple of times a week   [DISCONTINUED] rosuvastatin (CRESTOR) 10 MG tablet TAKE 1 TABLET (10 MG TOTAL) BY MOUTH ONCE A WEEK.    Facility-Administered Encounter Medications as of 10/14/2021  Medication   0.9 %  sodium chloride infusion   No Known Allergies   ROS: no fever, chills, URI symptoms, headaches, dizziness, chest pain, shortness of breath, numbness, tinging, edema, bleeding, bruising, rash. No GI or GU complaints. Moods are good. Denies myalgias. Runny nose per HPI (L side only).     PHYSICAL EXAM:  BP 128/78    Pulse 84    Ht 5\' 7"  (1.702 m)    Wt 127 lb 3.2 oz (57.7 kg)    BMI 19.92 kg/m   Wt Readings from Last 3 Encounters:  10/14/21 127 lb 3.2 oz (57.7 kg)  05/13/21 130 lb (59 kg)  03/04/21 129 lb (58.5 kg)   Well-appearing, pleasant female, in good spirits. HEENT: conjunctiva and sclera are clear, EOMI.  Mild-mod turbinate congestion (pale, no mucus). OP is clear. Sinuses nontender Neck: no lymphadenopathy, thyromegaly or carotid bruit. Heart: regular rate and rhythm Lungs: clear bilaterally Abdomen: soft, nontender, no organomegaly or mass Back: no CVA or spinal tenderness Extremities: no edema, normal pulses. Arthritic bony abnormalities in both hands. Psych: normal  mood, affect, hygiene and grooming Neuro: alert and oriented, normal gait  Lab Results  Component Value Date   HGBA1C 6.2 (A) 10/14/2021    ASSESSMENT/PLAN:  Type 2 diabetes mellitus without complication, without long-term current use of insulin ([place]) - well controlled - Plan: HgB A1c, metFORMIN (GLUCOPHAGE-XR) 500 MG 24 hr tablet  Osteopenia, unspecified location - Cont Ca, D, weight-bearing exercise.  Recheck 05/2023  Pure hypercholesterolemia - Excellent HDL.  last LDL <100, on once weekly Crestor 10mg . Continue  Allergic rhinitis, unspecified seasonality, unspecified trigger - discussed re-trial of antihistamine, vs Flonase. Can use sudafed prn if BP okay.   F/u as scheduled for CPE in 04/2022

## 2021-10-14 ENCOUNTER — Ambulatory Visit: Payer: 59 | Admitting: Family Medicine

## 2021-10-14 ENCOUNTER — Other Ambulatory Visit: Payer: Self-pay

## 2021-10-14 ENCOUNTER — Encounter: Payer: Self-pay | Admitting: Family Medicine

## 2021-10-14 VITALS — BP 128/78 | HR 84 | Ht 67.0 in | Wt 127.2 lb

## 2021-10-14 DIAGNOSIS — E7800 Pure hypercholesterolemia, unspecified: Secondary | ICD-10-CM | POA: Diagnosis not present

## 2021-10-14 DIAGNOSIS — M8580 Other specified disorders of bone density and structure, unspecified site: Secondary | ICD-10-CM

## 2021-10-14 DIAGNOSIS — E119 Type 2 diabetes mellitus without complications: Secondary | ICD-10-CM

## 2021-10-14 DIAGNOSIS — J309 Allergic rhinitis, unspecified: Secondary | ICD-10-CM | POA: Diagnosis not present

## 2021-10-14 LAB — POCT GLYCOSYLATED HEMOGLOBIN (HGB A1C): Hemoglobin A1C: 6.2 % — AB (ref 4.0–5.6)

## 2021-10-14 MED ORDER — METFORMIN HCL ER 500 MG PO TB24: ORAL_TABLET | ORAL | 1 refills | Status: DC

## 2021-10-14 NOTE — Patient Instructions (Signed)
° ° °  Consider re-trying a different antihistamine, vs a nasal steroid spray such as Flonase, if wanting better control over runny nose (vs using sudafed when needed, and ensuring that your blood pressure remains good--this should be avoided if BP is high).

## 2021-10-15 ENCOUNTER — Other Ambulatory Visit: Payer: Self-pay | Admitting: Internal Medicine

## 2021-10-15 DIAGNOSIS — E119 Type 2 diabetes mellitus without complications: Secondary | ICD-10-CM

## 2021-10-15 MED ORDER — ROSUVASTATIN CALCIUM 10 MG PO TABS: 10.0000 mg | ORAL_TABLET | ORAL | 1 refills | Status: DC

## 2021-10-16 ENCOUNTER — Encounter: Payer: Managed Care, Other (non HMO) | Admitting: Family Medicine

## 2021-11-04 ENCOUNTER — Other Ambulatory Visit: Payer: Self-pay | Admitting: Family Medicine

## 2021-11-04 DIAGNOSIS — F419 Anxiety disorder, unspecified: Secondary | ICD-10-CM

## 2021-11-04 DIAGNOSIS — G4700 Insomnia, unspecified: Secondary | ICD-10-CM

## 2021-11-04 NOTE — Telephone Encounter (Signed)
Are these okay to refill? 

## 2022-02-05 ENCOUNTER — Other Ambulatory Visit: Payer: Self-pay | Admitting: Family Medicine

## 2022-02-05 DIAGNOSIS — F419 Anxiety disorder, unspecified: Secondary | ICD-10-CM

## 2022-02-05 NOTE — Telephone Encounter (Signed)
Is this okay to refill? 

## 2022-03-04 ENCOUNTER — Telehealth: Payer: Self-pay

## 2022-03-04 ENCOUNTER — Other Ambulatory Visit: Payer: Self-pay | Admitting: Family Medicine

## 2022-03-04 DIAGNOSIS — E7800 Pure hypercholesterolemia, unspecified: Secondary | ICD-10-CM

## 2022-03-04 DIAGNOSIS — Z5181 Encounter for therapeutic drug level monitoring: Secondary | ICD-10-CM

## 2022-03-04 DIAGNOSIS — G4700 Insomnia, unspecified: Secondary | ICD-10-CM

## 2022-03-04 DIAGNOSIS — E119 Type 2 diabetes mellitus without complications: Secondary | ICD-10-CM

## 2022-03-04 DIAGNOSIS — Z0000 Encounter for general adult medical examination without abnormal findings: Secondary | ICD-10-CM

## 2022-03-04 NOTE — Telephone Encounter (Signed)
Is this okay to refill? 

## 2022-03-04 NOTE — Telephone Encounter (Signed)
Pt. Called to schedule a fasting lab visit on 05/12/22 before her CPE on 05/14/22 if you could put the order in for that.

## 2022-03-04 NOTE — Telephone Encounter (Signed)
Orders entered

## 2022-03-29 ENCOUNTER — Other Ambulatory Visit: Payer: Self-pay | Admitting: Family Medicine

## 2022-03-29 DIAGNOSIS — E119 Type 2 diabetes mellitus without complications: Secondary | ICD-10-CM

## 2022-04-10 ENCOUNTER — Ambulatory Visit (INDEPENDENT_AMBULATORY_CARE_PROVIDER_SITE_OTHER): Payer: 59 | Admitting: Podiatry

## 2022-04-10 ENCOUNTER — Ambulatory Visit (INDEPENDENT_AMBULATORY_CARE_PROVIDER_SITE_OTHER): Payer: 59

## 2022-04-10 ENCOUNTER — Encounter: Payer: Self-pay | Admitting: Podiatry

## 2022-04-10 DIAGNOSIS — M79672 Pain in left foot: Secondary | ICD-10-CM

## 2022-04-10 DIAGNOSIS — S92512A Displaced fracture of proximal phalanx of left lesser toe(s), initial encounter for closed fracture: Secondary | ICD-10-CM

## 2022-04-10 NOTE — Progress Notes (Signed)
Subjective:   Patient ID: Tracey Santiago, female   DOB: 66 y.o.   MRN: [id number]   HPI Patient states that I injured my left fifth toe 4 weeks ago and I may have broken it.  Patient does not smoke likes to be active and has been wearing open toed shoes for the last 4 weeks   Review of Systems  All other systems reviewed and are negative.       Objective:  Physical Exam Vitals and nursing note reviewed.  Constitutional:      Appearance: She is well-developed.  Pulmonary:     Effort: Pulmonary effort is normal.  Musculoskeletal:        General: Normal range of motion.  Skin:    General: Skin is warm.  Neurological:     Mental Status: She is alert.     Neurovascular status intact muscle strength found to be adequate range of motion within normal limits.  Patient is noted to have swelling of appropriate right foot with painful around the proximal phalanx with no indications of open injury     Assessment:  Fracture of the fifth digit left foot proximal phalanx     Plan:  H&P x-rays reviewed and went ahead today discussed fracture and that this will probably take another 6 to 8 weeks to heal completely.  Answered all questions patient to be seen back and will continue open toed shoes gradual return to soft shoe gear now  X-rays indicate fracture of the proximal phalanx fifth digit left that is showing signs of healing

## 2022-05-02 ENCOUNTER — Other Ambulatory Visit: Payer: Self-pay | Admitting: Family Medicine

## 2022-05-02 DIAGNOSIS — E119 Type 2 diabetes mellitus without complications: Secondary | ICD-10-CM

## 2022-05-02 NOTE — Telephone Encounter (Signed)
Has an appt in september 

## 2022-05-12 ENCOUNTER — Other Ambulatory Visit: Payer: Medicare Other

## 2022-05-14 ENCOUNTER — Encounter: Payer: Managed Care, Other (non HMO) | Admitting: Family Medicine

## 2022-05-21 ENCOUNTER — Encounter: Payer: Self-pay | Admitting: Internal Medicine

## 2022-05-29 ENCOUNTER — Other Ambulatory Visit: Payer: Self-pay | Admitting: Family Medicine

## 2022-05-29 DIAGNOSIS — E119 Type 2 diabetes mellitus without complications: Secondary | ICD-10-CM

## 2022-05-30 ENCOUNTER — Other Ambulatory Visit: Payer: Medicare Other

## 2022-05-30 DIAGNOSIS — E7800 Pure hypercholesterolemia, unspecified: Secondary | ICD-10-CM

## 2022-05-30 DIAGNOSIS — Z5181 Encounter for therapeutic drug level monitoring: Secondary | ICD-10-CM

## 2022-05-30 DIAGNOSIS — Z0000 Encounter for general adult medical examination without abnormal findings: Secondary | ICD-10-CM

## 2022-05-30 DIAGNOSIS — E119 Type 2 diabetes mellitus without complications: Secondary | ICD-10-CM

## 2022-06-01 LAB — TSH: TSH: 1.44 u[IU]/mL (ref 0.450–4.500)

## 2022-06-01 LAB — CBC WITH DIFFERENTIAL/PLATELET
Basophils Absolute: 0.1 10*3/uL (ref 0.0–0.2)
Basos: 1 %
EOS (ABSOLUTE): 0.1 10*3/uL (ref 0.0–0.4)
Eos: 1 %
Hematocrit: 41 % (ref 34.0–46.6)
Hemoglobin: 13.8 g/dL (ref 11.1–15.9)
Immature Grans (Abs): 0 10*3/uL (ref 0.0–0.1)
Immature Granulocytes: 0 %
Lymphocytes Absolute: 1.3 10*3/uL (ref 0.7–3.1)
Lymphs: 25 %
MCH: 32 pg (ref 26.6–33.0)
MCHC: 33.7 g/dL (ref 31.5–35.7)
MCV: 95 fL (ref 79–97)
Monocytes Absolute: 0.6 10*3/uL (ref 0.1–0.9)
Monocytes: 11 %
Neutrophils Absolute: 3.3 10*3/uL (ref 1.4–7.0)
Neutrophils: 62 %
Platelets: 231 10*3/uL (ref 150–450)
RBC: 4.31 x10E6/uL (ref 3.77–5.28)
RDW: 11.7 % (ref 11.7–15.4)
WBC: 5.3 10*3/uL (ref 3.4–10.8)

## 2022-06-01 LAB — COMPREHENSIVE METABOLIC PANEL
ALT: 13 IU/L (ref 0–32)
AST: 15 IU/L (ref 0–40)
Albumin/Globulin Ratio: 2.5 — ABNORMAL HIGH (ref 1.2–2.2)
Albumin: 4.9 g/dL (ref 3.9–4.9)
Alkaline Phosphatase: 67 IU/L (ref 44–121)
BUN/Creatinine Ratio: 27 (ref 12–28)
BUN: 20 mg/dL (ref 8–27)
Bilirubin Total: 0.5 mg/dL (ref 0.0–1.2)
CO2: 25 mmol/L (ref 20–29)
Calcium: 9.8 mg/dL (ref 8.7–10.3)
Chloride: 100 mmol/L (ref 96–106)
Creatinine, Ser: 0.74 mg/dL (ref 0.57–1.00)
Globulin, Total: 2 g/dL (ref 1.5–4.5)
Glucose: 158 mg/dL — ABNORMAL HIGH (ref 70–99)
Potassium: 4.5 mmol/L (ref 3.5–5.2)
Sodium: 138 mmol/L (ref 134–144)
Total Protein: 6.9 g/dL (ref 6.0–8.5)
eGFR: 89 mL/min/{1.73_m2} (ref >59)

## 2022-06-01 LAB — LIPID PANEL
Chol/HDL Ratio: 1.9 ratio (ref 0.0–4.4)
Cholesterol, Total: 243 mg/dL — ABNORMAL HIGH (ref 100–199)
HDL: 129 mg/dL (ref >39)
LDL Chol Calc (NIH): 106 mg/dL — ABNORMAL HIGH (ref 0–99)
Triglycerides: 48 mg/dL (ref 0–149)
VLDL Cholesterol Cal: 8 mg/dL (ref 5–40)

## 2022-06-01 LAB — HEMOGLOBIN A1C
Est. average glucose Bld gHb Est-mCnc: 151 mg/dL
Hgb A1c MFr Bld: 6.9 % — ABNORMAL HIGH (ref 4.8–5.6)

## 2022-06-01 LAB — MICROALBUMIN / CREATININE URINE RATIO
Creatinine, Urine: 56 mg/dL
Microalb/Creat Ratio: 6 mg/g creat (ref 0–29)
Microalbumin, Urine: 3.6 ug/mL

## 2022-06-01 NOTE — Patient Instructions (Incomplete)
  HEALTH MAINTENANCE RECOMMENDATIONS:  It is recommended that you get at least 30 minutes of aerobic exercise at least 5 days/week (for weight loss, you may need as much as 60-90 minutes). This can be any activity that gets your heart rate up. This can be divided in 10-15 minute intervals if needed, but try and build up your endurance at least once a week.  Weight bearing exercise is also recommended twice weekly.  Eat a healthy diet with lots of vegetables, fruits and fiber.  "Colorful" foods have a lot of vitamins (ie green vegetables, tomatoes, red peppers, etc).  Limit sweet tea, regular sodas and alcoholic beverages, all of which has a lot of calories and sugar.  Up to 1 alcoholic drink daily may be beneficial for women (unless trying to lose weight, watch sugars).  Drink a lot of water.  Calcium recommendations are 1200-1500 mg daily (1500 mg for postmenopausal women or women without ovaries), and vitamin D 1000 IU daily.  This should be obtained from diet and/or supplements (vitamins), and calcium should not be taken all at once, but in divided doses.  Monthly self breast exams and yearly mammograms for women over the age of 46 is recommended.  Sunscreen of at least SPF 30 should be used on all sun-exposed parts of the skin when outside between the hours of 10 am and 4 pm (not just when at beach or pool, but even with exercise, golf, tennis, and yard work!)  Use a sunscreen that says "broad spectrum" so it covers both UVA and UVB rays, and make sure to reapply every 1-2 hours.  Remember to change the batteries in your smoke detectors when changing your clock times in the spring and fall. Carbon monoxide detectors are recommended for your home.  Use your seat belt every time you are in a car, and please drive safely and not be distracted with cell phones and texting while driving.  Please bring [place] copies of your Living Will and [place] once completed and notarized so that  it can be scanned into your medical chart.  I recommend that you get the new RSV vaccine. It is new, and some commercial insurances may not cover it yet (but might cover it soon--if not covered now, check back in [place]).  This is a winter illness, so best to get the vaccine in the Fall.  We do not have this vaccine yet in our office. It is covered by Medicare part D, to get from the pharmacy (but you aren't on Medicare yet).  I recommend that you get the updated COVID booster when it becomes available (should be within the next month).  These vaccines should be separated from other vaccines by 2 weeks.  Please schedule yearly mammogram.  Please monitor blood sugars more regularly so that you can get an idea when your sugars are running high, and which  foods might be contributing. If your sugars remain high, we should increase the metformin dose to 3/day.  If you can't tolerate higher doses of metformin, and you can't get the sugars down on your own (without sacrificing significant quality of life), then we should consider a second medication such as [place] or Vania Rea Merleen Nicely is a combo of metformin and farxiga--? If covered by insurance).

## 2022-06-01 NOTE — Progress Notes (Unsigned)
No chief complaint on file.   Tracey Santiago is a 66 y.o. female who presents for a complete physical and f/u on chronic problems.  (She is not yet on Medicare, only Part A). See below for labs done prior to her visit.  She fractured the proximal phalanx of L 5th toe back in July, saw Dr. Paulla Dolly.  Diabetes.  Her A1c went up to 7.2% in 10/2020, despite medication and diet compliance. She used [employer] monitor to try and figure out how to improve her sugars. She improved sugars by eating protein first, cut back further on carbs in her diet (cut out bread, corn with dinner, and evening chocolate), and reducing fruit intake. A1c was down to 6.3% in 02/2021, and was 6.2% on last check in 09/2021.  She reports compliance with metformin ER '500mg'$  BID, and denies side effects. Sugars are running    She gets regular exercise.  She denies polydipsia, polyuria, numbness, tingling. Checks feet regularly, no concerns. Last diabetic eye exam was 08/2021, no retinopathy.   At one point her BP was elevated, so lisinopril was started, but this was stopped when creatinine bumped up to 1.6. She also had some dyspnea and palpitations while on lisinopril, which resolved after its discontinuation, and her Cr returned to normal.  BP's have been normal since.  Urine microalbumin/Cr ratio has been normal. BP Readings from Last 3 Encounters:  10/14/21 128/78  05/13/21 130/80  03/04/21 120/70   Lab Results  Component Value Date   CREATININE 0.74 05/30/2022     Lipids were excellent in the past, with very high HDL, so not put on statin initially.  When LDL went up to 135, she was started on Crestor '10mg'$  once a week in 04/2020.  She is tolerating this without side effects, and lipids are at goal.   Osteopenia:  She completed 5 years of alendronate (September 2016 through 05/2020). She continues to take Bone Strength "natural" calcium.  Vitamin D level was normal on last check (39 in 01/2015). She gets regular  weight-bearing exercise.  Last DEXA was 05/2021, with lowest measurement at L femoral neck, T-2.1, stable.  Slight decline noted at spine (T-1.7).   Anxiety and insomnia:  Anxiety related to worrying about whether or not she can sleep, which is alleviated by having ambien around just in case. She rarely needs medication to get to sleep, but will sometimes wake up in the middle of the night, feeling anxious. Depending on how close it is to morning, she will either take 1/2 [place] or 1/2 xanax. She has no side effects or unusual behaviors.  Only needs to take 1/2 tablet with good results, about 2-3x week, some weeks not at all. She also has anxiety related to travel (flying, trouble sleeping in hotels). Ambien was last filled for #30 on 03/04/22, and alprazolam was last refilled #30 on 02/05/22.    Immunization History  Administered Date(s) Administered   Hepatitis A, Adult 09/16/2017, 03/24/2018   Influenza Split 07/29/2012, 08/15/2013   Influenza, High Dose Seasonal PF 06/15/2019, 07/03/2021   Influenza,inj,Quad PF,6+ Mos 08/15/2015, 07/09/2016, 06/09/2017, 07/07/2018   PFIZER Comirnaty(Gray Top)Covid-19 Tri-Sucrose Vaccine 01/30/2021   PFIZER(Purple Top)SARS-COV-2 Vaccination 10/21/2019, 11/11/2019, 06/20/2020   Pfizer Covid-19 Vaccine Bivalent Booster 101yr & up 07/03/2021   Pneumococcal Conjugate-13 02/18/2016   Pneumococcal Polysaccharide-23 05/02/2020   Tdap 09/15/2008, 03/24/2018   Zoster Recombinat (Shingrix) 03/12/2017, 06/09/2017   Zoster, Live 02/07/2015   Last Pap smear: 04/2020 of vaginal cuff--normal with no high  risk HPV Last mammogram: 03/2021 Last colonoscopy: 04/2017 with Dr. Loletha Carrow, normal. Repeat 10 years. Last DEXA:  04/2019, showing improvement at left femoral neck.  Lowest was at L wrist, T-1.9. Dentist: twice yearly   Ophtho: yearly  Exercise:  She is active in the yard. Does yoga regularly (5x/week). She walks in at least 20 minute intervals throughout the day, 7 days/week.  Gets 15K steps daily. Using weights 5x/week   Patient Care Team: Rita Ohara, MD as PCP - General (Family Medicine) Podiatrist--Dr. Paulla Dolly Dentist--Dr. Jerilynn Birkenhead GI--Dr. Loletha Carrow Derm--Dr. Renda Rolls Ortho: Dr. Alvan Dame (hip), Dr. Doran Durand (foot) Neurosurg: Dr. Aliene Beams surgeon: Dr. Amedeo Plenty (once for consult), also prev saw Dr. Daylene Katayama (for carpal tunnel surgeries). Ophtho: Dr. Marica Otter (Dr. Anne Shutter did cataract surgery)  Depression Screening: Flowsheet Row Clinical Support from 10/14/2021 in [facility]  PHQ-2 Total Score 0         Falls screen:     10/14/2021   11:00 AM 05/13/2021    1:46 PM 02/06/2021   12:02 PM 11/07/2020    2:38 PM 05/02/2020    2:51 PM  Fall Risk   Falls in the past year? 0 0 0 0 0  Number falls in past yr: 0 0 0    Injury with Fall? 0 0 0    Risk for fall due to : No Fall Risks No Fall Risks No Fall Risks    Follow up Falls evaluation completed Falls evaluation completed Falls evaluation completed      She has living will and healthcare power of attorney; we do not have this on file.   PMH, PSH, SH and FH were reviewed and updated    ROS: Denies weight changes, anorexia, dizziness, syncope, cough, swelling, vomiting, diarrhea, constipation, abdominal pain, melena, hematochezia, indigestion/heartburn, hematuria, incontinence, dysuria, vaginal bleeding, discharge, odor or itch, genital lesions,numbness, tingling, weakness, tremor, suspicious skin lesions, depression, abnormal bleeding/bruising, or enlarged lymph nodes.   Intermittent insomnia, per HPI. Some dryness with intercourse, but does well with lubricant, tolerable. 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 Congestion in the mornings, uses a decongestant in the mornings prn.   PHYSICAL EXAM:  There were no vitals taken for this visit.  Wt Readings from Last 3 Encounters:  10/14/21  127 lb 3.2 oz (57.7 kg)  05/13/21 130 lb (59 kg)  03/04/21 129 lb (58.5 kg)    General Appearance:    Alert, cooperative, no distress, appears stated age   Head:    Normocephalic, without obvious abnormality, atraumatic     Eyes:    PERRL, conjunctiva/corneas clear, EOM's intact, fundi benign   Ears:    Normal TM's and external ear canals     Nose:    Not examined (wearing mask due to COVID-19 pandemic)   Throat:    Not examined (wearing mask due to COVID-19 pandemic)  Neck:    Supple, no lymphadenopathy; thyroid: no enlargement/ tenderness/nodules; no carotid bruit or JVD     Back:    Spine nontender, no curvature, ROM normal, no CVA tenderness    Lungs:    Clear to auscultation bilaterally without wheezes, rales or ronchi; respirations unlabored     Chest Wall:    No tenderness or deformity     Heart:    Regular rate and rhythm, S1 and S2 normal, no murmur, rub   or gallop     Breast Exam:  No tenderness, masses, or nipple discharge or inversion. No axillary lymphadenopathy. + implants, with some "crackling" sensation with palpation over the lateral/UOP aspects, unchanged.  Abdomen:    Soft, non-tender, nondistended, normoactive bowel sounds,   no masses, no hepatosplenomegaly     Genitalia:    Normal external genitalia without lesions. Mild atrophic changes noted. BUS and vagina normal; no abnormal vaginal discharge. Uterus and ovaries are surgically absent, nontender, no mass.   Rectal:    Normal tone, no masses or tenderness; heme negative stool     Extremities:    No clubbing, cyanosis or edema. Deviation of distal phalanges on 2nd through 4th fingers of R hand. Some inflammation and arthritic changes of DIPs on left as well, but no deviation noted. Nontender.  Pulses:    2+ and symmetric all extremities     Skin:    Skin color, texture, turgor normal, no rashes or lesions.   Lymph nodes:    Cervical, supraclavicular, inguinal and axillary nodes normal     Neurologic:    Normal  strength, sensation and gait; reflexes 2+ and symmetric throughout                       Psych:    Normal mood, affect, hygiene and grooming   Normal diabetic foot exam ?tender L 5th toe? ***UPDATE fingers    Chemistry      Component Value Date/Time   NA 138 05/30/2022 0842   K 4.5 05/30/2022 0842   K 5.0 07/28/2012 1251   CL 100 05/30/2022 0842   CL 107 07/28/2012 1251   CO2 25 05/30/2022 0842   CO2 23 07/28/2012 1251   BUN 20 05/30/2022 0842   CREATININE 0.74 05/30/2022 0842   CREATININE 0.82 03/04/2017 0736      Component Value Date/Time   CALCIUM 9.8 05/30/2022 0842   CALCIUM 9.4 07/28/2012 1251   ALKPHOS 67 05/30/2022 0842   ALKPHOS 37 07/28/2012 1251   AST 15 05/30/2022 0842   AST 18 07/28/2012 1251   ALT 13 05/30/2022 0842   BILITOT 0.5 05/30/2022 0842   BILITOT 0.4 07/28/2012 1251     Fasting glu 158  Urine microalb/Cr ratio 6  Lab Results  Component Value Date   TSH 1.440 05/30/2022   Lab Results  Component Value Date   WBC 5.3 05/30/2022   HGB 13.8 05/30/2022   HCT 41.0 05/30/2022   MCV 95 05/30/2022   PLT 231 05/30/2022   Lab Results  Component Value Date   CHOL 243 (H) 05/30/2022   HDL 129 05/30/2022   LDLCALC 106 (H) 05/30/2022   TRIG 48 05/30/2022   CHOLHDL 1.9 05/30/2022     ASSESSMENT/PLAN:  Still on [employer], not Medicare, right? Probably should do depression/fall and list of doctors, and mini-Cog. Ask about living will Can skip functional status survey.   Did she get mammo this summer? (Last we have is 03/2021)  High dose flu shot (she usually wait until October, see if she will take today, due to timing of the other 2 rec vaccines) Discussed COVID booster and RSV vaccine  DIABETIC FOOT EXAM  RF metformin ?ambien or xanax needed   Discussed monthly self breast exams and yearly mammograms; at least 30 minutes of aerobic activity at least 5 days/week ,weight bearing exercise at least 2x/wk; proper sunscreen use  reviewed; healthy diet, including goals of calcium and vitamin D intake and alcohol recommendations (less than or equal to 1 drink/day)  reviewed; regular seatbelt use; changing batteries in smoke detectors, carbon monoxide detector recommended. Immunization recommendations discussed--continue yearly hight dose flu shots  RSV vaccine recommended, to get from pharmacy. Updated COVID vaccine recommended when available later this Fall.   Colonoscopy UTD, due again 04/2027. DEXA due again September 2024.  MOST form Living will and healthcare power of attorney discussed, requested copies to be scanned into chart.   F/u 6 mos for med check

## 2022-06-02 ENCOUNTER — Encounter: Payer: Self-pay | Admitting: Family Medicine

## 2022-06-02 ENCOUNTER — Ambulatory Visit: Payer: 59 | Admitting: Family Medicine

## 2022-06-02 VITALS — BP 118/60 | HR 72 | Ht 66.0 in | Wt 126.2 lb

## 2022-06-02 DIAGNOSIS — N6310 Unspecified lump in the right breast, unspecified quadrant: Secondary | ICD-10-CM

## 2022-06-02 DIAGNOSIS — M8580 Other specified disorders of bone density and structure, unspecified site: Secondary | ICD-10-CM

## 2022-06-02 DIAGNOSIS — Z0000 Encounter for general adult medical examination without abnormal findings: Secondary | ICD-10-CM

## 2022-06-02 DIAGNOSIS — E119 Type 2 diabetes mellitus without complications: Secondary | ICD-10-CM

## 2022-06-02 DIAGNOSIS — E785 Hyperlipidemia, unspecified: Secondary | ICD-10-CM

## 2022-06-02 DIAGNOSIS — Z23 Encounter for immunization: Secondary | ICD-10-CM

## 2022-06-02 MED ORDER — METFORMIN HCL ER 500 MG PO TB24: 500.0000 mg | ORAL_TABLET | Freq: Two times a day (BID) | ORAL | 1 refills | Status: DC

## 2022-06-03 ENCOUNTER — Other Ambulatory Visit: Payer: Medicare Other

## 2022-06-06 ENCOUNTER — Other Ambulatory Visit: Payer: Self-pay | Admitting: Family Medicine

## 2022-06-06 DIAGNOSIS — H02729 Madarosis of unspecified eye, unspecified eyelid and periocular area: Secondary | ICD-10-CM

## 2022-06-06 NOTE — Telephone Encounter (Signed)
[facility] is requesting to fill pt bimatoprost gtt. Please advise. [place]

## 2022-06-07 ENCOUNTER — Other Ambulatory Visit: Payer: Self-pay | Admitting: Family Medicine

## 2022-06-07 DIAGNOSIS — H02729 Madarosis of unspecified eye, unspecified eyelid and periocular area: Secondary | ICD-10-CM

## 2022-06-09 LAB — HM MAMMOGRAPHY

## 2022-06-09 NOTE — Telephone Encounter (Signed)
Pt is aware that it isn't covered, she has been paying out of pocket for it

## 2022-06-20 ENCOUNTER — Encounter: Payer: Self-pay | Admitting: *Deleted

## 2022-06-24 ENCOUNTER — Encounter: Payer: Self-pay | Admitting: Internal Medicine

## 2022-09-01 ENCOUNTER — Other Ambulatory Visit: Payer: Self-pay | Admitting: Family Medicine

## 2022-09-01 DIAGNOSIS — F419 Anxiety disorder, unspecified: Secondary | ICD-10-CM

## 2022-09-01 DIAGNOSIS — G4700 Insomnia, unspecified: Secondary | ICD-10-CM

## 2022-09-13 ENCOUNTER — Other Ambulatory Visit: Payer: Self-pay | Admitting: Family Medicine

## 2022-09-13 DIAGNOSIS — E119 Type 2 diabetes mellitus without complications: Secondary | ICD-10-CM

## 2022-09-17 ENCOUNTER — Encounter: Payer: Self-pay | Admitting: Family Medicine

## 2022-09-17 ENCOUNTER — Ambulatory Visit (INDEPENDENT_AMBULATORY_CARE_PROVIDER_SITE_OTHER): Payer: 59 | Admitting: Family Medicine

## 2022-09-17 VITALS — BP 124/82 | HR 84 | Temp 97.8°F | Wt 125.6 lb

## 2022-09-17 DIAGNOSIS — J3489 Other specified disorders of nose and nasal sinuses: Secondary | ICD-10-CM | POA: Diagnosis not present

## 2022-09-17 DIAGNOSIS — J069 Acute upper respiratory infection, unspecified: Secondary | ICD-10-CM | POA: Diagnosis not present

## 2022-09-17 MED ORDER — AMOXICILLIN-POT CLAVULANATE 875-125 MG PO TABS: 1.0000 | ORAL_TABLET | Freq: Two times a day (BID) | ORAL | 0 refills | Status: DC

## 2022-09-17 NOTE — Patient Instructions (Signed)
Stay well hydrated. Try doing some sinus rinses once or twice daily.  (Neti-pot, or sinus rinse kit--be sure to use distilled or boiled water, not tap water). You may use pseudoephedrine as needed during the day (for sinus pain and congestion). Don't use this at night.  If after 1-2 days of using the decongestant and sinus rinses, you aren't noticing improvement in sinus pain or lightening of the discolored mucus, then start the antibiotics. 

## 2022-09-17 NOTE — Progress Notes (Signed)
Chief Complaint  Patient presents with   other    Started Thursday, congestion, facial pain, eyes and mouth were hurting, been taking Mucinex, mild ST    Started with cold symptoms on 12/28.  She has had worsening congestion, and increased facial pain. Congestion has not lightened up at all, which concerns her. She had a negative home COVID test yesterday.  She has been taking Mucinex--she started with the liquid for day and night, every 4 hours.  When she ran out of the daytime version, she got the 12 hour plain Mucinex which she has been taking during the day, and the 4 hour nighttime liquid at bedtime.  Her nasal drainage has been green, intermittently over the last couple of days. Rare cough, only if having a scratchy throat from PND.  She has discomfort across her forehead, and her upper jaw, sometimes has pain behind her eyes. Tylenol helps.  Was around a lot of family, some who had slight congestion (noted in their voices). Grandkids weren't sick while she was around them.   PMH, PSH, SH reviewed  Outpatient Encounter Medications as of 09/17/2022  Medication Sig Note   Ascorbic Acid (VITAMIN C PO) Take 500 mg by mouth daily.    bimatoprost (LATISSE) 0.03 % ophthalmic solution PLACE ONE DROP ON APPLICATOR AND APPLY EVENLY ALONG THE SKIN OF THE UPPER EYELID AT BASE OF EYELASHES ONCE DAILY AT BEDTIME REPEAT PROCEDURE FOR SECOND EYE (USE A CLEAN APPLICATOR).    Cyanocobalamin (B-12 PO) Take 1 tablet by mouth daily.    glucose blood test strip 1 each by Other route 2 (two) times daily. One Touch Verio Test Strips    MAGNESIUM PO Take 1 tablet by mouth daily.    metFORMIN (GLUCOPHAGE-XR) 500 MG 24 hr tablet Take 1 tablet (500 mg total) by mouth 2 (two) times daily with a meal.    NON FORMULARY Take 2 capsules by mouth daily.  08/15/2015: BONE STRENGTH SUPPLEMENT (orders online)   OneTouch Delica Lancets 93Z MISC 1 each by Does not apply route 2 (two) times daily.    rosuvastatin  (CRESTOR) 10 MG tablet TAKE 1 TABLET (10 MG TOTAL) BY MOUTH ONCE A WEEK.    ALPRAZolam (XANAX) 0.25 MG tablet TAKE 1-2 TABLETS (0.25-0.5 MG TOTAL) BY MOUTH 3 (THREE) TIMES DAILY AS NEEDED FOR ANXIETY. (Patient not taking: Reported on 09/17/2022)    zolpidem (AMBIEN) 5 MG tablet TAKE 1 TABLET BY MOUTH AT BEDTIME AS NEEDED FOR SLEEP. (Patient not taking: Reported on 09/17/2022) 09/17/2022: Uses prn, not in the last few days   Facility-Administered Encounter Medications as of 09/17/2022  Medication   0.9 %  sodium chloride infusion   Taking Mucinex 12 hour during the day, and nighttime liquid at night. Used some tylenol prn facial pain.  No Known Allergies  ROS: No known fever, chills. Has some fatigue, "just not feeling great".  Slightly "goofy" sometimes from the meds, more foggy brain. Sinus pressure and headaches per HPI. No chest pain, shortness of breath. No n/v/d.   PHYSICAL EXAM:  BP 124/82   Pulse 84   Temp 97.8 F (36.6 C)   Wt 125 lb 9.6 oz (57 kg)   BMI 20.27 kg/m   Well-appearing female in no distress. No sniffling, throat-clearing or coughing HEENT: conjunctiva and sclera are clear, EOMI.   Nasal mucosa is mildly edematous, with white mucus on the right.  There is some mild erythema and yellow mucus on the left.  Mildly tender at bilateral  maxillary sinuses, nontender at frontal sinuses.   OP is clear Neck: No lymphadenopathy or mass Heart: regular rate and rhythm, no murmur Lungs: clear bilaterally   ASSESSMENT/PLAN:  Upper respiratory tract infection, unspecified type - suspect viral, cannot r/o early sinus infection. Decongestants and sinus rinses, cont mucinex. Start ABX if not improving  Sinus pain - Plan: amoxicillin-clavulanate (AUGMENTIN) 875-125 MG tablet   Stay well hydrated. Try doing some sinus rinses once or twice daily.  (Neti-pot, or sinus rinse kit--be sure to use distilled or boiled water, not tap water). You may use pseudoephedrine as needed during  the day (for sinus pain and congestion). Don't use this at night.  If after 1-2 days of using the decongestant and sinus rinses, you aren't noticing improvement in sinus pain or lightening of the discolored mucus, then start the antibiotics.

## 2022-10-29 LAB — HM DIABETES EYE EXAM

## 2022-10-30 ENCOUNTER — Encounter: Payer: Self-pay | Admitting: *Deleted

## 2022-11-26 ENCOUNTER — Other Ambulatory Visit: Payer: Self-pay | Admitting: Family Medicine

## 2022-11-26 DIAGNOSIS — E119 Type 2 diabetes mellitus without complications: Secondary | ICD-10-CM

## 2022-12-06 ENCOUNTER — Other Ambulatory Visit: Payer: Self-pay | Admitting: Family Medicine

## 2022-12-06 DIAGNOSIS — E119 Type 2 diabetes mellitus without complications: Secondary | ICD-10-CM

## 2022-12-17 ENCOUNTER — Encounter: Payer: Self-pay | Admitting: Family Medicine

## 2022-12-23 ENCOUNTER — Other Ambulatory Visit: Payer: Medicare Other

## 2022-12-23 ENCOUNTER — Telehealth: Payer: Self-pay | Admitting: *Deleted

## 2022-12-23 DIAGNOSIS — E119 Type 2 diabetes mellitus without complications: Secondary | ICD-10-CM

## 2022-12-23 NOTE — Telephone Encounter (Signed)
Patient is here for labs for med check tomorrow and needs orders, thanks.

## 2022-12-23 NOTE — Telephone Encounter (Signed)
She only needs an A1c, didn't need labs prior to her visit (and no message was sent to me by Morrie Sheldon when this was scheduled on 3/21 that I can see, looking at staff messages as well). You can call her to see if she is having any issues or concerns, to see if we need to order anything else. If everything was drawn and held, I guess you can also do a fasting glucose.  But nothing else is routinely needed.  I entered future orders for fasting glu and A1c for you to release.

## 2022-12-23 NOTE — Progress Notes (Unsigned)
No chief complaint on file.  Patient presents for 6 month follow-up on chronic problems.  Diabetes.  Her A1c went up to 7.2% in 10/2020, despite medication and diet compliance. She used [employer] monitor to try and figure out how to improve her sugars. She improved sugars by eating protein first, cut back further on carbs in her diet (cut out bread, corn with dinner, and evening chocolate), and reducing fruit intake. A1c was down to 6.3% in 02/2021 after making these changes. Last A1c was 6.9% in September, when she had eaten more cantaloupe over the summer, and hadn't been checking sugar as regularly. She continues to limit sweets, desserts.  Has 1/2 sour dough english muffin every morning, some corn. She reports compliance with metformin ER 500mg  BID, and denies side effects. Sugars have been running   She gets regular exercise.  She denies polydipsia, polyuria, numbness, tingling. Checks feet regularly, no concerns. Last diabetic eye exam was 10/2022, no retinopathy.   Component Ref Range & Units 6 mo ago (05/30/22) 1 yr ago (10/14/21) 1 yr ago (03/04/21) 2 yr ago (11/05/20) 2 yr ago (04/30/20) 3 yr ago (10/19/19) 3 yr ago (04/11/19)  Hgb A1c MFr Bld 4.8 - 5.6 % 6.9 High  6.2 Abnormal  R 6.3 Abnormal  R 7.2 High  CM 6.4 High  CM 5.7 Abnormal  R 7.4 High  CM    At one point her BP was elevated, so lisinopril was started, but this was stopped when creatinine bumped up to 1.6. She also had some dyspnea and palpitations while on lisinopril, which resolved after its discontinuation, and her Cr returned to normal.  BP's have been normal since.  Urine microalbumin/Cr ratio has been normal, last checked 05/2022. BP Readings from Last 3 Encounters:  09/17/22 124/82  06/02/22 118/60  10/14/21 128/78     Lipids were excellent in the past, with very high HDL, so not put on statin initially.  When LDL went up to 135, she was started on Crestor 10mg  once a week in 04/2020.  She is tolerating this  without side effects. LDL <100 in 04/2021, was a little higher in September--she had spent 2 weeks in [place], where her diet was different--yogurt daily and omelettes.  No changes to meds were made. Diet is back to usual.  Component Ref Range & Units 6 mo ago (05/30/22) 1 yr ago (05/10/21) 2 yr ago (11/05/20) 2 yr ago (04/30/20) 3 yr ago (04/11/19) 4 yr ago (03/24/18) 5 yr ago (03/04/17)  Cholesterol, Total 100 - 199 mg/dL 751 High  025 High  852 High  279 High  267 High  279 High    Triglycerides 0 - 149 mg/dL 48 52 53 48 57 53 56 R  HDL >39 mg/dL 778 242 353 614 431 540 130 R, CM  VLDL Cholesterol Cal 5 - 40 mg/dL 8 9 8 7 11 11    LDL Chol Calc (NIH) 0 - 99 mg/dL 086 High  94 761 High  135 High      Chol/HDL Ratio 0.0 - 4.4 ratio 1.9 1.8 CM 2.0 CM 2.0 CM 2.1 CM 2.1 CM 2.0 R    Osteopenia:  She completed 5 years of alendronate (September 2016 through 05/2020). She continues to take Bone Strength "natural" calcium.  Vitamin D level was normal on last check (39 in 01/2015). She gets regular weight-bearing exercise.  Last DEXA was 05/2021, with lowest measurement at L femoral neck, T-2.1, stable.  Slight decline noted at spine (  T-1.7). due for recheck in 05/2023.   Anxiety and insomnia:  Anxiety related to worrying about whether or not she can sleep, prefers to have some ambien on hand, which helps alleviate that anxiety. She rarely needs medication to get to sleep, but will sometimes wake up in the middle of the night, feeling anxious. Depending on how close it is to morning, she will either take 1/2 [place] or 1/2 xanax. She has no side effects or unusual behaviors.  Only needs to take 1/2 tablet with good results, up to 2-3x week, some weeks not at all. She also has anxiety related to travel (flying, trouble sleeping in hotels). Ambien and alprazolam were both refilled #30 on 09/01/22; prior refills were in May/June.  Denies needing refill today.     PMH, PSH, SH reviewed    ROS: no fever,  chills, URI symptoms, headaches, dizziness, chest pain, shortness of breath, numbness, tinging, edema, bleeding, bruising, rash. No GI or GU complaints. Moods are good. Denies myalgias.    PHYSICAL EXAM:  There were no vitals taken for this visit.  Wt Readings from Last 3 Encounters:  09/17/22 125 lb 9.6 oz (57 kg)  06/02/22 126 lb 3.2 oz (57.2 kg)  10/14/21 127 lb 3.2 oz (57.7 kg)   Well-appearing, pleasant female, in good spirits. HEENT: conjunctiva and sclera are clear, EOMI.  OP is clear. Sinuses nontender Neck: no lymphadenopathy, thyromegaly or carotid bruit. Heart: regular rate and rhythm Lungs: clear bilaterally Abdomen: soft, nontender, no organomegaly or mass Back: no CVA or spinal tenderness Extremities: no edema, normal pulses. Arthritic bony abnormalities in both hands. Psych: normal mood, affect, hygiene and grooming Neuro: alert and oriented, normal gait   ASSESSMENT/PLAN:   COVID booster?   Metformin and rosuvastatin were refilled x90d a month ago.      Your next bone density scan is due 05/2023.  Please schedule this when you schedule your next mammogram (and [facility] can fax over an order to sign).   F/u 6 months CPE with fasting labs prior--Cbc, c-met, TSH, urine microalb, lipid, A1c (After 9/18) ENTER FUTURE ORDERS***

## 2022-12-23 NOTE — Telephone Encounter (Signed)
Spoke with patient and she did not have any concerns. Will release what you ordered.

## 2022-12-23 NOTE — Patient Instructions (Incomplete)
  Your next bone density scan is due 05/2023.  Please schedule this when you schedule your next mammogram (and [facility] can fax over an order to sign).

## 2022-12-24 ENCOUNTER — Ambulatory Visit: Payer: Self-pay | Admitting: Family Medicine

## 2022-12-24 ENCOUNTER — Telehealth: Payer: Self-pay | Admitting: Family Medicine

## 2022-12-24 ENCOUNTER — Encounter: Payer: Self-pay | Admitting: Family Medicine

## 2022-12-24 VITALS — BP 120/70 | HR 84 | Ht 66.0 in | Wt 122.2 lb

## 2022-12-24 DIAGNOSIS — Z23 Encounter for immunization: Secondary | ICD-10-CM

## 2022-12-24 DIAGNOSIS — E119 Type 2 diabetes mellitus without complications: Secondary | ICD-10-CM

## 2022-12-24 DIAGNOSIS — Z5181 Encounter for therapeutic drug level monitoring: Secondary | ICD-10-CM | POA: Diagnosis not present

## 2022-12-24 DIAGNOSIS — M8580 Other specified disorders of bone density and structure, unspecified site: Secondary | ICD-10-CM | POA: Diagnosis not present

## 2022-12-24 LAB — GLUCOSE, RANDOM: Glucose: 179 mg/dL — ABNORMAL HIGH (ref 70–99)

## 2022-12-24 LAB — HEMOGLOBIN A1C
Est. average glucose Bld gHb Est-mCnc: 163 mg/dL
Hgb A1c MFr Bld: 7.3 % — ABNORMAL HIGH (ref 4.8–5.6)

## 2022-12-24 MED ORDER — METFORMIN HCL ER 500 MG PO TB24: 1500.0000 mg | ORAL_TABLET | Freq: Every day | ORAL | 0 refills | Status: DC

## 2022-12-24 NOTE — Telephone Encounter (Signed)
I assume this is an FYI as I read your note about them sending over an order for [place] to sign.

## 2022-12-24 NOTE — Telephone Encounter (Signed)
Tracey Santiago called and states Dr.Knapp wanted her to get a bone density test done preferably the same day she is getting her mamo which is 06/12/23 at 10:30 am.

## 2022-12-24 NOTE — Telephone Encounter (Signed)
Patient was advised she should call [facility] to schedule the bone density test (for the same day as her mammo, which is due in September).  [facility] should fax [place] over the order to sign (often when closer to the visit, but can by now).  Not sure why she is calling us about this?

## 2022-12-30 ENCOUNTER — Encounter: Payer: Self-pay | Admitting: Family Medicine

## 2022-12-30 ENCOUNTER — Other Ambulatory Visit: Payer: Self-pay | Admitting: *Deleted

## 2022-12-30 MED ORDER — DEXCOM G7 SENSOR MISC: 2 refills | Status: DC

## 2023-01-07 ENCOUNTER — Other Ambulatory Visit: Payer: Self-pay | Admitting: *Deleted

## 2023-01-07 DIAGNOSIS — E119 Type 2 diabetes mellitus without complications: Secondary | ICD-10-CM

## 2023-01-07 MED ORDER — DEXCOM G7 RECEIVER DEVI
1.0000 | 0 refills | Status: DC
Start: 2023-01-07 — End: 2023-09-22

## 2023-01-09 ENCOUNTER — Encounter: Payer: Self-pay | Admitting: Family Medicine

## 2023-02-03 ENCOUNTER — Other Ambulatory Visit: Payer: Self-pay | Admitting: Family Medicine

## 2023-02-03 DIAGNOSIS — F419 Anxiety disorder, unspecified: Secondary | ICD-10-CM

## 2023-02-03 DIAGNOSIS — G4700 Insomnia, unspecified: Secondary | ICD-10-CM

## 2023-02-03 MED ORDER — ZOLPIDEM TARTRATE 5 MG PO TABS: 5.0000 mg | ORAL_TABLET | Freq: Every evening | ORAL | 0 refills | Status: DC | PRN

## 2023-02-03 MED ORDER — ALPRAZOLAM 0.25 MG PO TABS
0.2500 mg | ORAL_TABLET | Freq: Three times a day (TID) | ORAL | 0 refills | Status: DC | PRN
Start: 2023-02-03 — End: 2023-07-21

## 2023-02-03 NOTE — Telephone Encounter (Signed)
Is this okay to refill? 

## 2023-02-10 ENCOUNTER — Encounter: Payer: Self-pay | Admitting: Family Medicine

## 2023-02-10 ENCOUNTER — Other Ambulatory Visit: Payer: Self-pay | Admitting: *Deleted

## 2023-02-10 DIAGNOSIS — E119 Type 2 diabetes mellitus without complications: Secondary | ICD-10-CM

## 2023-02-10 MED ORDER — METFORMIN HCL ER 500 MG PO TB24
1500.0000 mg | ORAL_TABLET | Freq: Every day | ORAL | 0 refills | Status: DC
Start: 2023-02-10 — End: 2023-05-13

## 2023-02-28 ENCOUNTER — Other Ambulatory Visit: Payer: Self-pay | Admitting: Family Medicine

## 2023-02-28 DIAGNOSIS — E119 Type 2 diabetes mellitus without complications: Secondary | ICD-10-CM

## 2023-03-17 ENCOUNTER — Other Ambulatory Visit: Payer: Self-pay | Admitting: Family Medicine

## 2023-03-26 ENCOUNTER — Ambulatory Visit: Payer: 59 | Admitting: Family Medicine

## 2023-03-26 ENCOUNTER — Encounter: Payer: Self-pay | Admitting: Family Medicine

## 2023-03-26 VITALS — BP 110/68 | HR 99 | Temp 98.1°F | Wt 122.4 lb

## 2023-03-26 DIAGNOSIS — M545 Low back pain, unspecified: Secondary | ICD-10-CM | POA: Diagnosis not present

## 2023-03-26 MED ORDER — MELOXICAM 15 MG PO TABS
15.0000 mg | ORAL_TABLET | Freq: Every day | ORAL | 0 refills | Status: DC
Start: 2023-03-26 — End: 2023-07-08

## 2023-03-26 MED ORDER — METHOCARBAMOL 500 MG PO TABS
500.0000 mg | ORAL_TABLET | Freq: Three times a day (TID) | ORAL | 0 refills | Status: DC | PRN
Start: 2023-03-26 — End: 2023-07-08

## 2023-03-26 NOTE — Progress Notes (Signed)
Chief Complaint  Patient presents with   Hip Pain    Hip pain- will run down leg and back of buttocks. Sharp pain right at buttocks area   2 nights ago she started with some sharp pain in the R buttock, sometimes up into the waist. She noticed the pain upon standing up.  Prior to the pain, she had done some stretches (spinal twist and buttock stretch)--felt fine after stretching, but later that evening pain started.  Feels better when she is moving around. Felt better last night.  When she woke up this morning the pain was worse. She has some discomfort now, but much better overall.  She had some discomfort down the R leg this morning, which has resolved. No numbness, tingling or weakness. No changes to bowel/bladder (chronic issues with emptying bladder since prior GYN surgery).  Yesterday she walked, had no problems.  She took 2 advil this morning, which helped. Hot bath also helped a lot.  She has h/o back surgery, did very well, afraid it could be releated to that again. Anxious about needing more surgery, as many others she knows haven't done well with their back surgeries (she reports having an easy recovery).   PMH, PSH, SH reviewed  Outpatient Encounter Medications as of 03/26/2023  Medication Sig Note   ALPRAZolam (XANAX) 0.25 MG tablet Take 1-2 tablets (0.25-0.5 mg total) by mouth 3 (three) times daily as needed for anxiety. 03/26/2023: As needed   Ascorbic Acid (VITAMIN C PO) Take 500 mg by mouth daily.    Continuous Glucose Receiver (DEXCOM G7 RECEIVER) DEVI 1 each by Does not apply route as directed.    Continuous Glucose Sensor (DEXCOM G7 SENSOR) MISC CHANGE EVERY 10 DAYS    MAGNESIUM PO Take 1 tablet by mouth daily. 03/26/2023: 2 days ago- has to get more of it   metFORMIN (GLUCOPHAGE-XR) 500 MG 24 hr tablet Take 3 tablets (1,500 mg total) by mouth daily with breakfast. This can be split up during the day, as tolerated, if needed    NON FORMULARY Take 2 capsules by mouth  daily.  08/15/2015: BONE STRENGTH SUPPLEMENT (orders online)   rosuvastatin (CRESTOR) 10 MG tablet TAKE 1 TABLET (10 MG TOTAL) BY MOUTH ONCE A WEEK.    zolpidem (AMBIEN) 5 MG tablet Take 1 tablet (5 mg total) by mouth at bedtime as needed. for sleep    Cyanocobalamin (B-12 PO) Take 1 tablet by mouth daily.    glucose blood test strip 1 each by Other route 2 (two) times daily. One Touch Verio Test Strips    OneTouch Delica Lancets 33G MISC 1 each by Does not apply route 2 (two) times daily.    [DISCONTINUED] bimatoprost (LATISSE) 0.03 % ophthalmic solution PLACE ONE DROP ON APPLICATOR AND APPLY EVENLY ALONG THE SKIN OF THE UPPER EYELID AT BASE OF EYELASHES ONCE DAILY AT BEDTIME REPEAT PROCEDURE FOR SECOND EYE (USE A CLEAN APPLICATOR).    [DISCONTINUED] 0.9 %  sodium chloride infusion     No facility-administered encounter medications on file as of 03/26/2023.   No Known Allergies  ROS: no fever, chills, n/v/d. Only had some nausea related to low blood sugars (twice, in the upper 60's, never lower). No urinary complaints. No bleeding, bruising or rash. Moods are good. See HPI   PHYSICAL EXAM:  BP 110/68   Pulse 99   Temp 98.1 F (36.7 C)   Wt 122 lb 6.4 oz (55.5 kg)   BMI 19.76 kg/m   Wt Readings  from Last 3 Encounters:  03/26/23 122 lb 6.4 oz (55.5 kg)  12/24/22 122 lb 3.2 oz (55.4 kg)  09/17/22 125 lb 9.6 oz (57 kg)   Pleasant, thin female, in no distress, in good spirits, slightly anxious HEENT: conjunctiva and sclera are clear, EOMI Heart: regular rate and rhythm Lungs: clear bilaterally Back: Spine nontender, no SI tenderness. Mild discomfort at R QL muscle Some decreased ROM (external rotation) of replaced R hip, but no discomfort in buttock/pyriformis Nontender at sciatic notch. Extremities: no edema. Some crepitus at both knees. Neuro: alert and oriented, cranial nerves grossly intact. DTR's normal, normal  gait. Hip flexors 4+/5 bilaterally, 5/5 elsewhere in  LE's Negative SLR--both legs caused some pulling/discomfort and the R lower back muscles. Psych: mildly anxious, full range of affect. Normal eye contact, speech, hygiene and grooming Skin: normal turgor, no visible rashes   ASSESSMENT/PLAN:  Acute right-sided low back pain, unspecified whether sciatica present - muscular in nature. Heat, massage, muscle relaxants, NSAIDs. Risks/SE/precautions reviewed in detail. - Plan: methocarbamol (ROBAXIN) 500 MG tablet, meloxicam (MOBIC) 15 MG tablet  Has DM med check scheduled for Monday.    Methocarbamol--this is a muscle relaxant. It shouldn't bee too sedating, use with caution. Use this first.  If it doesn't knock out the discomfort, then start the anti-inflammatory  Stop using advil/ibuprofen/motrin Instead take the meloxicam 15 mg. Take this once daily with food. If it bothers your stomach, cut the dose in half. Take it daily (with food) until your pain has completely resolved.  I suspect you will need it for up to 7-10 days, but possibly less.  Use heat regularly. You can consider the thermacare patch if you need more long-term heat (ie when working). Do the stretches we discussed. Avoid doing spinal twists. Listen to your body.  Avoid the yoga stretches that cause more discomfort.  Massaging the area may help. If you develop numbness, tingling, or weakness in the right leg, or much more severe pain, please return for re-evaluation.

## 2023-03-26 NOTE — Patient Instructions (Signed)
  Methocarbamol--this is a muscle relaxant. It shouldn't bee too sedating, use with caution. Use this first.  If it doesn't knock out the discomfort, then start the anti-inflammatory  Stop using advil/ibuprofen/motrin Instead take the meloxicam 15 mg. Take this once daily with food. If it bothers your stomach, cut the dose in half. Take it daily (with food) until your pain has completely resolved.  I suspect you will need it for up to 7-10 days, but possibly less.  Use heat regularly. You can consider the thermacare patch if you need more long-term heat (ie when working). Do the stretches we discussed. Avoid doing spinal twists. Listen to your body.  Avoid the yoga stretches that cause more discomfort.  Massaging the area may help. If you develop numbness, tingling, or weakness in the right leg, or much more severe pain, please return for re-evaluation.

## 2023-03-29 NOTE — Progress Notes (Unsigned)
Chief Complaint  Patient presents with   Diabetes    Nonfasting med check. Brought in printout from [facility] reader device. No new concerns. I scheduled her for 10/21 for labs.    Patient presents today for 3 month f/u on her diabetes. She was seen last week with back pain, prescribed meloxicam and methocarbamol. She reports today that her back is much better. She used the muscle relaxant only, never needed to take the meloxicam.   Diabetes:  Her A1c was up to 7.3% in April (up from 6.9% in 05/2022).  At that time she was taking metformin ER 500mg  BID.  She had been eating more fruit (mandarin oranges, as well as strawberries and blueberries), and hadn't been checking blood sugars.  We increased metformin to 1500 mg daily, and she has been using Dexcom 7 CGM. She is tolerating metformin without side effects.  She has some SE when dose first increased.  She had some nausea.  Prefers to split up the tablets rather than taking them all at once.  She has been taking 1.5 tablets (extended release) twice daily, taking AFTER breakfast and dinner (due to concerns about nausea if taken earlier). She had some nausea when she initially tried to take 2 tablets together.  Hasn't noticed any patterns as to when sugars are up, seems to go up without reasons.  Hasn't made any particular changes to her diet. Tries to keep fruit portions very small. She continues to limit sweets  Noticed highs after eating a wrap (even with eliminating the excess) Sugars go up after breakfast (alarms go off just as she is finishing the meal). Again, she takes the metformin after she eats. Sugar starts to rise as soon as she wakes up. Eats after showeing. Up to 250 at end of meal or just after   She gets regular exercise.  She denies polydipsia, polyuria, numbness, tingling. Checks feet regularly, no concerns. Last diabetic eye exam was 10/2022, no retinopathy.   PMH, PSH, SH reviewed  Outpatient Encounter Medications as of  03/30/2023  Medication Sig Note   Ascorbic Acid (VITAMIN C PO) Take 500 mg by mouth daily.    Continuous Glucose Receiver (DEXCOM G7 RECEIVER) DEVI 1 each by Does not apply route as directed.    Continuous Glucose Sensor (DEXCOM G7 SENSOR) MISC CHANGE EVERY 10 DAYS    Cyanocobalamin (B-12 PO) Take 1 tablet by mouth daily.    MAGNESIUM PO Take 1 tablet by mouth daily.    metFORMIN (GLUCOPHAGE-XR) 500 MG 24 hr tablet Take 3 tablets (1,500 mg total) by mouth daily with breakfast. This can be split up during the day, as tolerated, if needed    NON FORMULARY Take 2 capsules by mouth daily.  08/15/2015: BONE STRENGTH SUPPLEMENT (orders online)   rosuvastatin (CRESTOR) 10 MG tablet TAKE 1 TABLET (10 MG TOTAL) BY MOUTH ONCE A WEEK.    ALPRAZolam (XANAX) 0.25 MG tablet Take 1-2 tablets (0.25-0.5 mg total) by mouth 3 (three) times daily as needed for anxiety. (Patient not taking: Reported on 03/30/2023) 03/30/2023: As needed   glucose blood test strip 1 each by Other route 2 (two) times daily. One Touch Verio Test Strips (Patient not taking: Reported on 03/30/2023)    meloxicam (MOBIC) 15 MG tablet Take 1 tablet (15 mg total) by mouth daily. Take with food, daily, until your pain has resolved (Patient not taking: Reported on 03/30/2023) 03/30/2023: Never needed   methocarbamol (ROBAXIN) 500 MG tablet Take 1-2 tablets (500-1,000 mg total)  by mouth every 8 (eight) hours as needed for muscle spasms. (Patient not taking: Reported on 03/30/2023) 03/30/2023: Last dose Sat   OneTouch Delica Lancets 33G MISC 1 each by Does not apply route 2 (two) times daily. (Patient not taking: Reported on 03/30/2023)    zolpidem (AMBIEN) 5 MG tablet Take 1 tablet (5 mg total) by mouth at bedtime as needed. for sleep (Patient not taking: Reported on 03/30/2023) 03/30/2023: As needed   No facility-administered encounter medications on file as of 03/30/2023.   No Known Allergies  ROS: no fever, chills, vomiting or diarrhea.  Mild nausea with  low blood sugars.  Denies significant hypoglycemia (lowest was upper 60's). Denies diarrhea No chest pain, shortness of breath. No urinary complaints. No bleeding, bruising or rash. Moods are good. Low back pain is much better.  See HPI   PHYSICAL EXAM:  BP 110/60   Pulse 60   Ht 5\' 6"  (1.676 m)   Wt 121 lb 9.6 oz (55.2 kg)   BMI 19.63 kg/m   Wt Readings from Last 3 Encounters:  03/30/23 121 lb 9.6 oz (55.2 kg)  03/26/23 122 lb 6.4 oz (55.5 kg)  12/24/22 122 lb 3.2 oz (55.4 kg)   Pleasant, well-appearing, thin female in no distress HEENT: conjunctiva and sclera are clear, EOMI Neck: no lymphadenopathy, thyromegaly or mass Heart: regular rate and rhythm Lungs: clear bilaterally Back: no spinal or CVA tendeness, no longer tender at R paraspinous muscles Extremities: no edema  Lab Results  Component Value Date   HGBA1C 7.0 (A) 03/30/2023     ASSESSMENT/PLAN:  Type 2 diabetes mellitus with hyperglycemia, without long-term current use of insulin (HCC) - Plan: HgB A1c, Referral to Nutrition and Diabetes Services  Discussed glycemic index of foods; rec metformin prior to meals, and not to split extended release tablets. Will refer back to DM education for further info. Hold dose at 1500 mg metformin. To keep food journal and bring to visit, along with her CGM. If A1c remains 7 or greater, will further titrate dose to 2000 mg metformin, if tolerates. If not tolerated, can do [place] or [place].  I don't want her losing much weight, so will avoid GLP.  F/u as scheduled in October with labs prior  Metformin should be taken PRIOR to eating, not after. Extended release tablets should not be cut. If you end up needing to take it three times daily, that's fine (if you can't tolerate higher doses at one time). We potentially could switch you to the regular-acting metformin (rather than long-acting) since you are taking it multiple times a day anyway. For now, take 2 metformin  at the start of breakfast.  If you tolerate this, then going forward, try and take it 30 minutes before the meal, if possible.  Take 1 pill before dinner.  If sugars aren't improving, we can further increase the metformin to 2000 mg daily (if tolerated).  They make a 1000 mg tablet that you can take twice daily (before breakfast and dinner).

## 2023-03-30 ENCOUNTER — Encounter: Payer: Self-pay | Admitting: Family Medicine

## 2023-03-30 ENCOUNTER — Ambulatory Visit: Payer: 59 | Admitting: Family Medicine

## 2023-03-30 VITALS — BP 110/60 | HR 60 | Ht 66.0 in | Wt 121.6 lb

## 2023-03-30 DIAGNOSIS — E1165 Type 2 diabetes mellitus with hyperglycemia: Secondary | ICD-10-CM

## 2023-03-30 DIAGNOSIS — E119 Type 2 diabetes mellitus without complications: Secondary | ICD-10-CM

## 2023-03-30 NOTE — Patient Instructions (Addendum)
Metformin should be taken PRIOR to eating, not after. Extended release tablets should not be cut. If you end up needing to take it three times daily, that's fine (if you can't tolerate higher doses at one time). We potentially could switch you to the regular-acting metformin (rather than long-acting) since you are taking it multiple times a day anyway. For now, take 2 metformin at the start of breakfast.  If you tolerate this, then going forward, try and take it 30 minutes before the meal, if possible.  Take 1 pill before dinner.  If sugars aren't improving, we can further increase the metformin to 2000 mg daily (if tolerated).  They make a 1000 mg tablet that you can take twice daily (before breakfast and dinner).  We are referring you back for additional diabetes education. Keep a food journal and bring that along with your Dexcom monitor.

## 2023-03-31 LAB — POCT GLYCOSYLATED HEMOGLOBIN (HGB A1C): Hemoglobin A1C: 7 % — AB (ref 4.0–5.6)

## 2023-04-08 ENCOUNTER — Encounter: Payer: Self-pay | Admitting: Family Medicine

## 2023-05-13 ENCOUNTER — Other Ambulatory Visit: Payer: Self-pay | Admitting: Family Medicine

## 2023-05-13 DIAGNOSIS — E119 Type 2 diabetes mellitus without complications: Secondary | ICD-10-CM

## 2023-05-13 DIAGNOSIS — G4700 Insomnia, unspecified: Secondary | ICD-10-CM

## 2023-05-13 MED ORDER — ROSUVASTATIN CALCIUM 10 MG PO TABS
10.0000 mg | ORAL_TABLET | ORAL | 0 refills | Status: DC
Start: 2023-05-13 — End: 2023-08-10

## 2023-05-13 MED ORDER — METFORMIN HCL ER 500 MG PO TB24
1500.0000 mg | ORAL_TABLET | Freq: Every day | ORAL | 0 refills | Status: DC
Start: 2023-05-13 — End: 2023-08-10

## 2023-05-13 MED ORDER — ZOLPIDEM TARTRATE 5 MG PO TABS
5.0000 mg | ORAL_TABLET | Freq: Every evening | ORAL | 0 refills | Status: DC | PRN
Start: 2023-05-13 — End: 2023-11-24

## 2023-05-13 NOTE — Telephone Encounter (Signed)
Is this okay to refill? 

## 2023-06-12 LAB — HM DEXA SCAN

## 2023-06-12 LAB — HM MAMMOGRAPHY

## 2023-06-15 ENCOUNTER — Other Ambulatory Visit: Payer: Self-pay | Admitting: Family Medicine

## 2023-06-17 ENCOUNTER — Encounter: Payer: Self-pay | Admitting: Dietician

## 2023-06-17 ENCOUNTER — Encounter: Payer: 59 | Attending: Family Medicine | Admitting: Dietician

## 2023-06-17 DIAGNOSIS — E119 Type 2 diabetes mellitus without complications: Secondary | ICD-10-CM | POA: Diagnosis present

## 2023-06-17 NOTE — Progress Notes (Signed)
Diabetes Self-Management Education  Visit Type: First/Initial  Appt. Start Time: 1405 Appt. End Time: 1515  06/17/2023  Tracey Santiago, identified by name and date of birth, is a 67 y.o. female with a diagnosis of Diabetes: Type 2.   ASSESSMENT Primary concern:G7 with reader; lows  History includes: anemia, anxiety, arthritis, cancer, type 2 diabetes, HLD, HTN, nerve disease Supplements: vitamin D, vitamin C magnesium, B12 Labs noted:  Lab Results  Component Value Date   HGBA1C 7.0 (A) 03/30/2023   Pt presents today alone for initial diabetes visit. Pt reports metformin administration as prescribed c/o several episodes of diarrhea weekly. RD encouraged Pt to administer metformin on a full stomach in an effort to to decrease GI complaints.   Pt feels her blood sugars are "erratic" and c/o alarms for hypo and hyper glycemia using G7. Pt reports hypoglycemia with a value of "40" mg/dL and she felt "nauseous" and this improved with "eating". Pt reports she had multiple alarms occur in the middle of the night with no symptoms and states she does sleep on her side at times. Pt reports intake of cheese and yogurt daily. Pt reports she wished she enjoyed vegetables more and states many that she accepts. Pt reports she attempted to eliminate rice, white potatoes and croutons and is willing to try these foods again. All Pt's questions were answered during this encounter.  Average is  154  for 14 days Glucose management indicator 7% % Time in range (70-180 mg/dL): 76 % (Goal >78%) Time High (181-250 mg/dL) 20 % (Goal < 46%) Time Very High (>250 mg/dl) 2 % (Goal < 5%) Time Low (54-69 mg/dL): 1 % (Goal is <9%) Time Very Low (<54) 1%  (Goal <1%)   There were no vitals taken for this visit. There is no height or weight on file to calculate BMI.   Diabetes Self-Management Education - 06/17/23 1517       Visit Information   Visit Type First/Initial      Initial Visit   Diabetes Type  Type 2    Date Diagnosed 2015    Are you currently following a meal plan? Yes   low carb low sugar (pt report)   What type of meal plan do you follow? low carb low sugar (pt report)    Are you taking your medications as prescribed? Yes      Health Coping   How would you rate your overall health? Excellent      Psychosocial Assessment   Patient Belief/Attitude about Diabetes Other (comment)   frustrated   What is the hardest part about your diabetes right now, causing you the most concern, or is the most worrisome to you about your diabetes?   Making healty food and beverage choices    Self-care barriers None    Self-management support Doctor's office    Other persons present Patient    Patient Concerns Nutrition/Meal planning    Special Needs None    Preferred Learning Style No preference indicated    Learning Readiness Ready    How often do you need to have someone help you when you read instructions, pamphlets, or other written materials from your doctor or pharmacy? 1 - Never    What is the last grade level you completed in school? college      Pre-Education Assessment   Patient understands the diabetes disease and treatment process. Needs Instruction    Patient understands incorporating nutritional management into lifestyle. Needs Instruction  Patient undertands incorporating physical activity into lifestyle. Needs Instruction    Patient understands using medications safely. Needs Instruction    Patient understands monitoring blood glucose, interpreting and using results Needs Instruction    Patient understands prevention, detection, and treatment of acute complications. Needs Instruction    Patient understands prevention, detection, and treatment of chronic complications. Needs Instruction    Patient understands how to develop strategies to address psychosocial issues. Needs Instruction    Patient understands how to develop strategies to promote health/change behavior. Needs  Instruction      Complications   Last HgB A1C per patient/outside source 7 %    How often do you check your blood sugar? > 4 times/day   CGM   Fasting Blood glucose range (mg/dL) 78-295;621-308    Postprandial Blood glucose range (mg/dL) 657-846    Number of hypoglycemic episodes per month 2    Can you tell when your blood sugar is low? Yes    What do you do if your blood sugar is low? eat a snack    Number of hyperglycemic episodes ( >200mg /dL): Weekly    Have you had a dilated eye exam in the past 12 months? Yes    Have you had a dental exam in the past 12 months? Yes    Are you checking your feet? Yes    How many days per week are you checking your feet? 3      Dietary Intake   Breakfast 1 slice whole grain low carb bread,  1 egg fried with OO,  1 cup berries or melons, coffee with half n half    Snack (morning) greek yogurt 20 gram PRO, ,<20 g CHO    Lunch low carb whole grain crackers with peanut butter or chicken salad OR [place] wrap, chicken salad or swiss and [place]    Snack (afternoon) nuts    Dinner salmon or beef with vegetable and sometimes sweet potato OR soup    Beverage(s) water, rarely tea with stevia, diet coke      Activity / Exercise   Activity / Exercise Type Moderate (swimming / aerobic walking)    How many days per week do you exercise? 7    How many minutes per day do you exercise? 30    Total minutes per week of exercise 210      Patient Education   Previous Diabetes Education Yes (please comment)    Disease Pathophysiology Definition of diabetes, type 1 and 2, and the diagnosis of diabetes;Explored patient's options for treatment of their diabetes    Healthy Eating Role of diet in the treatment of diabetes and the relationship between the three main macronutrients and blood glucose level;Plate Method;Reviewed blood glucose goals for pre and post meals and how to evaluate the patients' food intake on their blood glucose level.;Meal options for control of  blood glucose level and chronic complications.;Meal timing in regards to the patients' current diabetes medication.    Being Active Role of exercise on diabetes management, blood pressure control and cardiac health.    Medications Reviewed patients medication for diabetes, action, purpose, timing of dose and side effects.    Monitoring Taught/evaluated CGM (comment)    Acute complications Taught prevention, symptoms, and  treatment of hypoglycemia - the 15 rule.;Discussed and identified patients' prevention, symptoms, and treatment of hyperglycemia.    Chronic complications Relationship between chronic complications and blood glucose control    Diabetes Stress and Support Identified and addressed patients feelings and  concerns about diabetes;Role of stress on diabetes    Lifestyle and Health Coping Lifestyle issues that need to be addressed for better diabetes care      Individualized Goals (developed by patient)   Nutrition General guidelines for healthy choices and portions discussed    Physical Activity Exercise 5-7 days per week;30 minutes per day    Medications take my medication as prescribed    Monitoring  Consistenly use CGM    Problem Solving Eating Pattern    Reducing Risk examine blood glucose patterns;do foot checks daily;treat hypoglycemia with 15 grams of carbs if blood glucose less than 70mg /dL    Health Coping Ask for help with psychological, social, or emotional issues      Post-Education Assessment   Patient understands the diabetes disease and treatment process. Comprehends key points    Patient understands incorporating nutritional management into lifestyle. Comprehends key points    Patient undertands incorporating physical activity into lifestyle. Demonstrates understanding / competency    Patient understands using medications safely. Demonstrates understanding / competency    Patient understands monitoring blood glucose, interpreting and using results Comprehends key  points    Patient understands prevention, detection, and treatment of acute complications. Comprehends key points    Patient understands prevention, detection, and treatment of chronic complications. Comprehends key points    Patient understands how to develop strategies to address psychosocial issues. Comprehends key points    Patient understands how to develop strategies to promote health/change behavior. Comprehends key points      Outcomes   Expected Outcomes Demonstrated interest in learning. Expect positive outcomes    Future DMSE 3-4 months    Program Status Not Completed             Individualized Plan for Diabetes Self-Management Training:   Learning Objective:  Patient will have a greater understanding of diabetes self-management. Patient education plan is to attend individual and/or group sessions per assessed needs and concerns.   Plan:   Patient Instructions    1- Increasing non starchy vegetables at lunch  Aim for 3-4 days weekly   Expected Outcomes:  Demonstrated interest in learning. Expect positive outcomes  Education material provided: ADA - How to Thrive: A Guide for Your Journey with Diabetes  If problems or questions, patient to contact team via:  Phone  Future DSME appointment: 3-4 months

## 2023-06-17 NOTE — Patient Instructions (Addendum)
   1- Increasing non starchy vegetables at lunch  Aim for 3-4 days weekly

## 2023-06-22 ENCOUNTER — Encounter: Payer: Self-pay | Admitting: *Deleted

## 2023-07-06 ENCOUNTER — Other Ambulatory Visit: Payer: 59

## 2023-07-06 DIAGNOSIS — E119 Type 2 diabetes mellitus without complications: Secondary | ICD-10-CM

## 2023-07-06 DIAGNOSIS — Z5181 Encounter for therapeutic drug level monitoring: Secondary | ICD-10-CM

## 2023-07-07 LAB — COMPREHENSIVE METABOLIC PANEL
ALT: 15 IU/L (ref 0–32)
AST: 16 IU/L (ref 0–40)
Albumin: 4.6 g/dL (ref 3.9–4.9)
Alkaline Phosphatase: 69 IU/L (ref 44–121)
BUN/Creatinine Ratio: 28 (ref 12–28)
BUN: 19 mg/dL (ref 8–27)
Bilirubin Total: 0.5 mg/dL (ref 0.0–1.2)
CO2: 25 mmol/L (ref 20–29)
Calcium: 9.9 mg/dL (ref 8.7–10.3)
Chloride: 98 mmol/L (ref 96–106)
Creatinine, Ser: 0.67 mg/dL (ref 0.57–1.00)
Globulin, Total: 2.3 g/dL (ref 1.5–4.5)
Glucose: 164 mg/dL — ABNORMAL HIGH (ref 70–99)
Potassium: 4.7 mmol/L (ref 3.5–5.2)
Sodium: 137 mmol/L (ref 134–144)
Total Protein: 6.9 g/dL (ref 6.0–8.5)
eGFR: 95 mL/min/{1.73_m2} (ref >59)

## 2023-07-07 LAB — TSH: TSH: 1.28 u[IU]/mL (ref 0.450–4.500)

## 2023-07-07 LAB — CBC WITH DIFFERENTIAL/PLATELET
Basophils Absolute: 0.1 10*3/uL (ref 0.0–0.2)
Basos: 1 %
EOS (ABSOLUTE): 0.1 10*3/uL (ref 0.0–0.4)
Eos: 2 %
Hematocrit: 42.8 % (ref 34.0–46.6)
Hemoglobin: 13.9 g/dL (ref 11.1–15.9)
Immature Grans (Abs): 0 10*3/uL (ref 0.0–0.1)
Immature Granulocytes: 0 %
Lymphocytes Absolute: 1.7 10*3/uL (ref 0.7–3.1)
Lymphs: 25 %
MCH: 32 pg (ref 26.6–33.0)
MCHC: 32.5 g/dL (ref 31.5–35.7)
MCV: 99 fL — ABNORMAL HIGH (ref 79–97)
Monocytes Absolute: 0.6 10*3/uL (ref 0.1–0.9)
Monocytes: 9 %
Neutrophils Absolute: 4.2 10*3/uL (ref 1.4–7.0)
Neutrophils: 63 %
Platelets: 259 10*3/uL (ref 150–450)
RBC: 4.34 x10E6/uL (ref 3.77–5.28)
RDW: 11.5 % — ABNORMAL LOW (ref 11.7–15.4)
WBC: 6.6 10*3/uL (ref 3.4–10.8)

## 2023-07-07 LAB — LIPID PANEL
Chol/HDL Ratio: 1.8 ratio (ref 0.0–4.4)
Cholesterol, Total: 252 mg/dL — ABNORMAL HIGH (ref 100–199)
HDL: 139 mg/dL (ref >39)
LDL Chol Calc (NIH): 105 mg/dL — ABNORMAL HIGH (ref 0–99)
Triglycerides: 50 mg/dL (ref 0–149)
VLDL Cholesterol Cal: 8 mg/dL (ref 5–40)

## 2023-07-07 LAB — HEMOGLOBIN A1C
Est. average glucose Bld gHb Est-mCnc: 160 mg/dL
Hgb A1c MFr Bld: 7.2 % — ABNORMAL HIGH (ref 4.8–5.6)

## 2023-07-07 LAB — MICROALBUMIN / CREATININE URINE RATIO
Creatinine, Urine: 58.9 mg/dL
Microalb/Creat Ratio: <5 mg/g{creat} (ref 0–29)
Microalbumin, Urine: <3 ug/mL

## 2023-07-07 NOTE — Patient Instructions (Incomplete)
  HEALTH MAINTENANCE RECOMMENDATIONS:  It is recommended that you get at least 30 minutes of aerobic exercise at least 5 days/week (for weight loss, you may need as much as 60-90 minutes). This can be any activity that gets your heart rate up. This can be divided in 10-15 minute intervals if needed, but try and build up your endurance at least once a week.  Weight bearing exercise is also recommended twice weekly.  Eat a healthy diet with lots of vegetables, fruits and fiber.  "Colorful" foods have a lot of vitamins (ie green vegetables, tomatoes, red peppers, etc).  Limit sweet tea, regular sodas and alcoholic beverages, all of which has a lot of calories and sugar.  Up to 1 alcoholic drink daily may be beneficial for women (unless trying to lose weight, watch sugars).  Drink a lot of water.  Calcium recommendations are 1200-1500 mg daily (1500 mg for postmenopausal women or women without ovaries), and vitamin D 1000 IU daily.  This should be obtained from diet and/or supplements (vitamins), and calcium should not be taken all at once, but in divided doses.  Monthly self breast exams and yearly mammograms for women over the age of 81 is recommended.  Sunscreen of at least SPF 30 should be used on all sun-exposed parts of the skin when outside between the hours of 10 am and 4 pm (not just when at beach or pool, but even with exercise, golf, tennis, and yard work!)  Use a sunscreen that says "broad spectrum" so it covers both UVA and UVB rays, and make sure to reapply every 1-2 hours.  Remember to change the batteries in your smoke detectors when changing your clock times in the spring and fall. Carbon monoxide detectors are recommended for your home.  Use your seat belt every time you are in a car, and please drive safely and not be distracted with cell phones and texting while driving.  Please bring [place] copies of your Living Will and Healthcare Power of Attorney so that it can be scanned into your  medical chart.  Try taking some metamucil daily.  This can help with the diarrhea related to Metformin.   Add Jardiance once daily. Continue the metformin at the current dose (1500 mg daily). Continue to try and do your best re: diet and exercise.  Eat more calories--more protein and vegetables, in order to maintain your weight. Continue weight-bearing exercise to build your muscles.  Use voltaren gel to the painful area of your left elbow.  If you have persistent discomfort at your elbow or your knee, we can send you to a sports medicine doctor. You can either call [facility] yourself, or we can refer you.

## 2023-07-07 NOTE — Progress Notes (Unsigned)
No chief complaint on file.   Tracey Santiago is a 68 y.o. female who presents for a complete physical and f/u on chronic problems.  (She is not yet on Medicare, only Part A). See below for labs done prior to her visit.  Diabetes:  Last A1c was 7% in July, when taking Metformin 1500 mg daily (splitting it up, was taking 1.5mg  of the ER tablets BID). She was taking it after eating, was advised to switch to taking it with, and then ultimately try taking it PRIOR to eating, and not to split the ER tablets..  She had more diarrhea when taking it prior to eating.  She saw nutritionist earlier this month.    She gets regular exercise.  She denies polydipsia, polyuria, numbness, tingling. Checks feet regularly, no concerns. Last diabetic eye exam was 10/2022, no retinopathy.  At one point her BP was elevated, so lisinopril was started, but this was stopped when creatinine bumped up to 1.6. She also had some dyspnea and palpitations while on lisinopril, which resolved after its discontinuation, and her Cr returned to normal.  BP's have been normal since.  Urine microalbumin/Cr ratio has been normal. BP Readings from Last 3 Encounters:  03/30/23 110/60  03/26/23 110/68  12/24/22 120/70     Lipids were excellent in the past, with very high HDL, so not put on statin initially.  When LDL went up to 135, she was started on Crestor 10mg  once a week in 04/2020.  She is tolerating this without side effects.  LDL initially was <100. Last year it was 106, but she had been in [place], had more eggs, yogurt, not her typical diet. LDL this year was similar, at 105.  HDL remains excellent.  Component Ref Range & Units 1 d ago (07/06/23) 1 yr ago (05/30/22) 2 yr ago (05/10/21) 2 yr ago (11/05/20) 3 yr ago (04/30/20) 4 yr ago (04/11/19) 5 yr ago (03/24/18)  Cholesterol, Total 100 - 199 mg/dL 161 High  096 High  045 High  270 High  279 High  267 High  279 High   Triglycerides 0 - 149 mg/dL 50 48 52 53 48 57 53   HDL >39 mg/dL 409 811 914 782 956 213 133  VLDL Cholesterol Cal 5 - 40 mg/dL 8 8 9 8 7 11 11   LDL Chol Calc (NIH) 0 - 99 mg/dL 086 High  578 High  94 125 High  135 High     Chol/HDL Ratio 0.0 - 4.4 ratio 1.8 1.9 CM 1.8 CM 2.0 CM 2.0 CM 2.1 CM 2.1 CM     Osteopenia:  She completed 5 years of alendronate (September 2016 through 05/2020). She continues to take Bone Strength "natural" calcium.  Vitamin D level was normal on last check (39 in 01/2015). She gets regular weight-bearing exercise.  Last DEXA was 05/2023 showing osteopenia with mild decline.   Anxiety and insomnia:  Anxiety related to worrying about whether or not she can sleep, which is alleviated by having ambien around just in case. She rarely needs medication to get to sleep, but will sometimes wake up in the middle of the night, feeling anxious. Depending on how close it is to morning, she will either take 1/2 [place] or 1/2 xanax. She has no side effects or unusual behaviors.  Only needs to take 1/2 tablet with good results, about 2-3x week, some weeks not at all. She also has anxiety related to travel (flying, trouble sleeping in hotels).  Ambien  was last filled #30 on 05/13/23, with prior fill on 02/03/23. Alprazolam was last filled for #15 on 02/03/23, prior fill was 09/01/22 for #30.  Immunization History  Administered Date(s) Administered   Fluad Quad(high Dose 65+) 06/02/2022   Hepatitis A, Adult 09/16/2017, 03/24/2018   Influenza Split 07/29/2012, 08/15/2013   Influenza, High Dose Seasonal PF 06/15/2019, 07/03/2021   Influenza,inj,Quad PF,6+ Mos 08/15/2015, 07/09/2016, 06/09/2017, 07/07/2018   PFIZER Comirnaty(Gray Top)Covid-19 Tri-Sucrose Vaccine 01/30/2021   PFIZER(Purple Top)SARS-COV-2 Vaccination 10/21/2019, 11/11/2019, 06/20/2020   Pfizer Covid-19 Vaccine Bivalent Booster 62yrs & up 07/03/2021   Pfizer(Comirnaty)Fall Seasonal Vaccine 12 years and older 12/24/2022   Pneumococcal Conjugate-13 02/18/2016   Pneumococcal  Polysaccharide-23 05/02/2020   Respiratory Syncytial Virus Vaccine,Recomb Aduvanted(Arexvy) 08/20/2022   Tdap 09/15/2008, 03/24/2018   Zoster Recombinant(Shingrix) 03/12/2017, 06/09/2017   Zoster, Live 02/07/2015  She had another COVID booster in 01/2022 (prior to her trip to [place]). Last Pap smear: 04/2020 of vaginal cuff--normal with no high risk HPV Last mammogram: 05/2023 Last colonoscopy: 04/2017 with Dr. Myrtie Neither, normal. Repeat 10 years. Last DEXA:  05/2023, slight decline noted.  UPDATE *** (Prior was 05/2021, with lowest measurement at L femoral neck, T-2.1, stable.  Slight decline noted at spine (T-1.7))  Dentist: twice yearly   Ophtho: yearly  Exercise:   She is active in the yard. Does yoga regularly (5x/week). She walks in at least 20 minute intervals throughout the day, 7 days/week. Gets 15K steps daily. Using weights 5x/week   Patient Care Team: Joselyn Arrow, MD as PCP - General (Family Medicine) Podiatrist--Dr. Charlsie Merles Dentist--Dr. Darlyne Russian GI--Dr. Myrtie Neither Derm--Dr. Sharyn Lull Ortho: Dr. Charlann Boxer (hip), Dr. Victorino Dike (foot) Neurosurg: Dr. Lemmie Evens surgeon: Dr. Amanda Pea (once for consult), also prev saw Dr. Teressa Senter (for carpal tunnel surgeries). Ophtho: Dr. Blima Ledger (Dr. Lonia Chimera did cataract surgery)  Depression Screening: Flowsheet Row Nutrition from 06/17/2023 in [facility] Total Score 0        Falls screen:     06/17/2023    3:23 PM 12/24/2022   10:12 AM 06/02/2022    3:00 PM 10/14/2021   11:00 AM 05/13/2021    1:46 PM  Fall Risk   Falls in the past year? 0 0 0 0 0  Number falls in past yr:  0 0 0 0  Injury with Fall?  0 0 0 0  Risk for fall due to :  No Fall Risks No Fall Risks No Fall Risks No Fall Risks  Follow up  Falls evaluation completed Falls evaluation completed Falls evaluation completed Falls evaluation completed       She has living will and healthcare power of attorney; we do not have this on  file.     PMH, PSH, SH and FH were reviewed and updated    ROS: Denies weight changes, anorexia, dizziness, syncope, cough, swelling, vomiting, diarrhea, constipation, abdominal pain, melena, hematochezia, indigestion/heartburn, hematuria, incontinence, dysuria, vaginal bleeding, discharge, odor or itch, genital lesions,numbness, tingling, weakness, tremor, suspicious skin lesions, depression, abnormal bleeding/bruising, or enlarged lymph nodes.   Intermittent insomnia, per HPI. Some dryness with intercourse, but does well with lubricant, tolerable. 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 Congestion in the mornings (clear drainage), alternates between claritin and decongestant with good results.  No issues later in the day.   PHYSICAL EXAM:  There were no vitals taken for this visit.  Wt Readings  from Last 3 Encounters:  03/30/23 121 lb 9.6 oz (55.2 kg)  03/26/23 122 lb 6.4 oz (55.5 kg)  12/24/22 122 lb 3.2 oz (55.4 kg)    General Appearance:    Alert, cooperative, no distress, appears stated age   Head:    Normocephalic, without obvious abnormality, atraumatic     Eyes:    PERRL, conjunctiva/corneas clear, EOM's intact, fundi benign   Ears:    Normal TM's and external ear canals     Nose:    Normal no drainage or sinus tenderness  Throat:    Normal mucosa, no lesions  Neck:    Supple, no lymphadenopathy; thyroid: no enlargement/ tenderness/nodules; no carotid bruit or JVD     Back:    Spine nontender, no curvature, ROM normal, no CVA tenderness    Lungs:    Clear to auscultation bilaterally without wheezes, rales or ronchi; respirations unlabored     Chest Wall:    No tenderness or deformity     Heart:    Regular rate and rhythm, S1 and S2 normal, no murmur, rub   or gallop     Breast Exam:    No tenderness, nipple discharge or inversion. No axillary lymphadenopathy. +  implants, with some "crackling" sensation with palpation over the lateral/UOP aspects, unchanged. Today there is noted to be a 1.5 cm very firm mass/nodule at R inferior breast, nontender (pt hadn't been aware)  Abdomen:    Soft, non-tender, nondistended, normoactive bowel sounds,   no masses, no hepatosplenomegaly     Genitalia:    Normal external genitalia without lesions. Mild atrophic changes noted. BUS and vagina normal; no abnormal vaginal discharge. Uterus and ovaries are surgically absent, nontender, no mass.   Rectal:    Normal tone, no masses or tenderness; heme negative stool     Extremities:    No clubbing, cyanosis or edema. Deviation of distal phalanges on 2nd through 4th fingers of R hand. Some inflammation and arthritic changes of DIPs on left as well, but no deviation noted. Nontender.  Pulses:    2+ and symmetric all extremities     Skin:    Skin color, texture, turgor normal, no rashes or lesions.   Lymph nodes:    Cervical, supraclavicular, inguinal and axillary nodes normal     Neurologic:    Normal strength, sensation and gait; reflexes 2+ and symmetric throughout                       Psych:    Normal mood, affect, hygiene and grooming   Normal diabetic foot exam  ***DM FOOT EXAM    Chemistry      Component Value Date/Time   NA 137 07/06/2023 0814   K 4.7 07/06/2023 0814   K 5.0 07/28/2012 1251   CL 98 07/06/2023 0814   CL 107 07/28/2012 1251   CO2 25 07/06/2023 0814   CO2 23 07/28/2012 1251   BUN 19 07/06/2023 0814   CREATININE 0.67 07/06/2023 0814   CREATININE 0.82 03/04/2017 0736      Component Value Date/Time   CALCIUM 9.9 07/06/2023 0814   CALCIUM 9.4 07/28/2012 1251   ALKPHOS 69 07/06/2023 0814   ALKPHOS 37 07/28/2012 1251   AST 16 07/06/2023 0814   AST 18 07/28/2012 1251   ALT 15 07/06/2023 0814   BILITOT 0.5 07/06/2023 0814   BILITOT 0.4 07/28/2012 1251     Fasting glu 164  Urine microalb/Cr ratio <5  Lab Results  Component Value Date    TSH 1.280 07/06/2023   Lab Results  Component Value Date   WBC 6.6 07/06/2023   HGB 13.9 07/06/2023   HCT 42.8 07/06/2023   MCV 99 (H) 07/06/2023   PLT 259 07/06/2023   Lab Results  Component Value Date   CHOL 252 (H) 07/06/2023   HDL 139 07/06/2023   LDLCALC 105 (H) 07/06/2023   TRIG 50 07/06/2023   CHOLHDL 1.8 07/06/2023     ASSESSMENT/PLAN:  DIABETIC FOOT EXAM  Crestor 2x/week vs cont once weekly (LDL >100, but excellent HDL remains)  DEXA not yet scanned/attached, only abstracted.  Was done 3-4 weeks ago. I'd like to have a copy to discuss in more detail with her.  Remind to get [place] LW and [facility] POA  Discussed monthly self breast exams and yearly mammograms (past due--abnormal exam of R breast today, referring for diagnostic mammo); at least 30 minutes of aerobic activity at least 5 days/week ,weight bearing exercise at least 2x/wk; proper sunscreen use reviewed; healthy diet, including goals of calcium and vitamin D intake and alcohol recommendations (less than or equal to 1 drink/day) reviewed; regular seatbelt use; changing batteries in smoke detectors, carbon monoxide detector recommended. Immunization recommendations discussed--continue yearly high dose flu shots (given today).  COVID booster Colonoscopy UTD, due again 04/2027.   Living will and healthcare power of attorney discussed, requested copies to be scanned into chart.   F/u 6 mos for med check

## 2023-07-08 ENCOUNTER — Encounter: Payer: Self-pay | Admitting: Family Medicine

## 2023-07-08 ENCOUNTER — Other Ambulatory Visit (HOSPITAL_COMMUNITY)
Admission: RE | Admit: 2023-07-08 | Discharge: 2023-07-08 | Disposition: A | Discharge status: Home / Self Care | Payer: 59 | Source: Ambulatory Visit | Attending: Family Medicine | Admitting: Family Medicine

## 2023-07-08 ENCOUNTER — Ambulatory Visit: Payer: Medicare Other | Admitting: Family Medicine

## 2023-07-08 VITALS — BP 120/70 | HR 64 | Ht 66.0 in | Wt 118.4 lb

## 2023-07-08 DIAGNOSIS — M25522 Pain in left elbow: Secondary | ICD-10-CM

## 2023-07-08 DIAGNOSIS — M8580 Other specified disorders of bone density and structure, unspecified site: Secondary | ICD-10-CM | POA: Diagnosis not present

## 2023-07-08 DIAGNOSIS — Z23 Encounter for immunization: Secondary | ICD-10-CM | POA: Diagnosis not present

## 2023-07-08 DIAGNOSIS — E785 Hyperlipidemia, unspecified: Secondary | ICD-10-CM | POA: Diagnosis not present

## 2023-07-08 DIAGNOSIS — Z8541 Personal history of malignant neoplasm of cervix uteri: Secondary | ICD-10-CM | POA: Diagnosis present

## 2023-07-08 DIAGNOSIS — E119 Type 2 diabetes mellitus without complications: Secondary | ICD-10-CM

## 2023-07-08 DIAGNOSIS — G4700 Insomnia, unspecified: Secondary | ICD-10-CM

## 2023-07-08 DIAGNOSIS — Z0000 Encounter for general adult medical examination without abnormal findings: Secondary | ICD-10-CM | POA: Insufficient documentation

## 2023-07-08 DIAGNOSIS — M25562 Pain in left knee: Secondary | ICD-10-CM

## 2023-07-08 MED ORDER — EMPAGLIFLOZIN 10 MG PO TABS: 10.0000 mg | ORAL_TABLET | Freq: Every day | ORAL | 3 refills | Status: DC

## 2023-07-08 NOTE — Assessment & Plan Note (Signed)
Pap of vaginal cuff taken today

## 2023-07-08 NOTE — Assessment & Plan Note (Addendum)
A1c >7, with high fasting sugars.  At goal body weight, eats healthy diet, saw nutritionist.  Increasing vegetable intake.  She lost a little bit of weight. She is somewhat afraid to eat, since everything makes her sugar higher.  She has some diarrhea from the metformin.  Therefore, recommendation was made to start Jardiance, low dose Marcelline Deist wasn't preferred).  Discussed possible risk factors, including weight loss, UTI, yeast infections. Discussed the importance of increasing her caloric intake (with protein, vegetables), and continue weight-bearing exercise--pushing heavier weights with fewer repetitions (once elbow feels better). Continue metformin 1500 mg daily.  To try metamucil daily to help with the loose stools. F/u with nutritionist as planned. Not a candidate for GLP d/t her weight.

## 2023-07-08 NOTE — Assessment & Plan Note (Signed)
Mild decline in osteopenia.  Recheck DEXA in 2 years. Revewed Ca, D, weight-bearing exercise recommendations

## 2023-07-08 NOTE — Assessment & Plan Note (Signed)
LDL 105, but HDL excellent at 139.  Continue once weekly rosuvastatin.  Discussed low cholesterol diet.

## 2023-07-08 NOTE — Assessment & Plan Note (Signed)
Intermittent.  Reviewed risks/SE of zolpidem and alprazolam. Continue to use sparingly.

## 2023-07-15 LAB — CYTOLOGY - PAP
Comment: NEGATIVE
Diagnosis: NEGATIVE
High risk HPV: NEGATIVE

## 2023-07-17 NOTE — Progress Notes (Unsigned)
   Rubin Payor, PhD, LAT, ATC acting as a scribe for Clementeen Graham, MD.  Tracey Santiago is a 67 y.o. female who presents to [facility] at [facility] today for L knee pain x ***. Pt locates pain to ***  L Knee swelling: Mechanical symptoms: Aggravates: Treatments tried:  Pertinent review of systems: ***  Relevant historical information: ***   Exam:  There were no vitals taken for this visit. General: Well Developed, well nourished, and in no acute distress.   MSK: ***    Lab and Radiology Results No results found for this or any previous visit (from the past 72 hour(s)). No results found.     Assessment and Plan: 67 y.o. female with ***   PDMP not reviewed this encounter. No orders of the defined types were placed in this encounter.  No orders of the defined types were placed in this encounter.    Discussed warning signs or symptoms. Please see discharge instructions. Patient expresses understanding.   ***

## 2023-07-20 ENCOUNTER — Other Ambulatory Visit: Payer: Self-pay

## 2023-07-20 ENCOUNTER — Other Ambulatory Visit: Payer: Self-pay | Admitting: Family Medicine

## 2023-07-20 ENCOUNTER — Ambulatory Visit: Payer: 59 | Admitting: Family Medicine

## 2023-07-20 ENCOUNTER — Ambulatory Visit (INDEPENDENT_AMBULATORY_CARE_PROVIDER_SITE_OTHER): Payer: 59

## 2023-07-20 VITALS — BP 142/82 | HR 91 | Ht 66.0 in | Wt 120.0 lb

## 2023-07-20 DIAGNOSIS — G8929 Other chronic pain: Secondary | ICD-10-CM | POA: Diagnosis not present

## 2023-07-20 DIAGNOSIS — M25522 Pain in left elbow: Secondary | ICD-10-CM

## 2023-07-20 DIAGNOSIS — M25562 Pain in left knee: Secondary | ICD-10-CM | POA: Diagnosis not present

## 2023-07-20 DIAGNOSIS — H02729 Madarosis of unspecified eye, unspecified eyelid and periocular area: Secondary | ICD-10-CM

## 2023-07-20 NOTE — Patient Instructions (Addendum)
Thank you for coming in today.   Please get an Xray today before you leave   Please use Voltaren gel (Generic Diclofenac Gel) up to 4x daily for pain as needed.  This is available over-the-counter as both the name brand Voltaren gel and the generic diclofenac gel.   I've referred you to Physical Therapy.  Let us know if you don't hear from them in one week.   Check back in 6 weeks

## 2023-07-20 NOTE — Telephone Encounter (Signed)
This was discontinued/removed from med list by [place] in July.  It wasn't on med list and not mentioned at her recent physical.  If she is still wanting/using this, then can refill

## 2023-07-20 NOTE — Telephone Encounter (Signed)
Is this okay to refill? 

## 2023-07-21 ENCOUNTER — Encounter: Payer: Self-pay | Admitting: Family Medicine

## 2023-07-21 DIAGNOSIS — F419 Anxiety disorder, unspecified: Secondary | ICD-10-CM

## 2023-07-21 MED ORDER — ALPRAZOLAM 0.25 MG PO TABS: 0.2500 mg | ORAL_TABLET | Freq: Three times a day (TID) | ORAL | 0 refills | Status: DC | PRN

## 2023-07-21 NOTE — Therapy (Signed)
OUTPATIENT PHYSICAL THERAPY EVALUATION   Patient Name: Tracey Santiago MRN: [id number] DOB:[birth year], 67 y.o., female Today's Date: 07/22/2023  END OF SESSION:  PT End of Session - 07/22/23 1057     Visit Number 1    Number of Visits 20    Date for PT Re-Evaluation 09/30/23    Authorization Type [employer] $35 copay, 60 visits    Progress Note Due on Visit 10    PT Start Time 1100    PT Stop Time 1145    PT Time Calculation (min) 45 min    Activity Tolerance Patient tolerated treatment well    Behavior During Therapy WFL for tasks assessed/performed             Past Medical History:  Diagnosis Date   Anemia    Anxiety    "mild"   Arthritis    spine, right hand, hip   Carpal tunnel syndrome, bilateral    no problems s/p surgery bilaterally   Cervical cancer (HCC) 2005   DDD (degenerative disc disease), cervical    C4-5, C5-6, some oseophytosis, foraminal narrowing C3-4, C4-5   Diabetes mellitus without complication (HCC)    Hyperlipidemia    mild   Hypertension    Impaired fasting glucose    DM diagnosed 07/2015 for 2 fasting glucose >126   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 placement   BASAL CELL CARCINOMA EXCISION  02/09/2018   basal cell carinoma, superficial an nodular patterns   BREAST SURGERY  1987   breast augmentation, implants replaced 11/2008   CARPAL TUNNEL RELEASE  11/07/2011   Procedure: CARPAL TUNNEL RELEASE;  Surgeon: Wyn Forster., MD;  Location: [facility];  Service: Orthopedics;  Laterality: Left;   CARPAL TUNNEL RELEASE  04/08/2012   Procedure: CARPAL TUNNEL RELEASE;  Surgeon: Wyn Forster., MD;  Location:  SURGERY CENTER;  Service: Orthopedics;  Laterality: Right;   CATARACT EXTRACTION, BILATERAL  Feb and  March 2019   Dr. Lonia Chimera   CESAREAN SECTION     childbirth     x3- 2 NVD, 1 c-section   FOOT SURGERY Right 11/16/2018   Dr. Charlsie Merles (between 4rh and 5th toes)   HAMMER TOE SURGERY Left 06/2012   POSTERIOR LAMINECTOMY / DECOMPRESSION LUMBAR SPINE  07/21/2011   arthrodesis L4-5 (Dr. Danielle Dess)   TONSILLECTOMY     TOTAL HIP ARTHROPLASTY Right 12/13/2013   Procedure: RIGHT TOTAL HIP ARTHROPLASTY ANTERIOR APPROACH;  Surgeon: Shelda Pal, MD;  Location: [facility];  Service: Orthopedics;  Laterality: Right;   Patient Active Problem List   Diagnosis Date Noted   History of cervical cancer 07/08/2023   Type 2 diabetes mellitus without complication, without long-term current use of insulin (HCC) 08/15/2015   Osteopenia 02/07/2015   S/P right THA, AA 12/13/2013   Insomnia 01/31/2013   Impaired fasting glucose 08/04/2012   Hyperlipidemia 08/04/2012   Acquired spondylolisthesis 07/21/2011    Class: Diagnosis of   Spinal stenosis, lumbar region, with neurogenic claudication 07/21/2011   Carpal tunnel syndrome 08/30/2009   ARTHRITIS, CERVICAL SPINE 05/07/2009   PARESTHESIA 05/07/2009   SPONDYLOLISTHESIS 03/23/2009   METATARSALGIA 03/15/2009   CAVUS DEFORMITY OF FOOT, ACQUIRED 03/15/2009   HAMMER TOE 03/15/2009    PCP: Joselyn Arrow MD  REFERRING PROVIDER: Rodolph Bong,  MD  REFERRING DIAG: [phone] (ICD-10-CM) - Chronic pain of left knee M25.522 (ICD-10-CM) - Left elbow pain  THERAPY DIAG:  Pain in left elbow  Chronic pain of left knee  Muscle weakness (generalized)  Rationale for Evaluation and Treatment: Rehabilitation  ONSET DATE: Acute on chronic around Sept/oct 2024  SUBJECTIVE:   SUBJECTIVE STATEMENT: Referral for Lt elbow and Lt knee pain.   Pt indicated complaints in leg has been going on for a while, noted when doing yoga/kneeling.  Pt indicated worsening a few weeks ago that seemed to be more troublesome with prolonged sitting/inactivity with symptoms down leg at  times.  Pt indicated some variance in symptoms during the day.   Pt indicated having complaints in elbow with trouble in past that may have been related to weights.  Currently noticing in activity for workouts with pressure.   Reported symptoms in elbow proximal to elbow, with some difficulty in straightening.    Does yoga about 5x/week.   PERTINENT HISTORY: DDD, DM, hyperlipidemia, HTN.  Reported borderline osteoporosis.   PAIN:  NPRS scale: knee at worst  7/10, elbow at worst:  5/10 Pain location: Lt knee (back of knee), Lt elbow  Pain description: stiffness/ache for knee  Aggravating factors: knee:  prolonged sitting/stationary time.     Elbow: lifting, carrying, full extension, WB Relieving factors: resting from painful exercises  PRECAUTIONS: None  WEIGHT BEARING RESTRICTIONS: No  FALLS:  Has patient fallen in last 6 months? No  LIVING ENVIRONMENT: Stairs: has stairs but not painful  OCCUPATION: Work in Sport and exercise psychologist  PLOF: Independent, Rt hand dominant.  Yoga and exercise routine.   PATIENT GOALS: Reduce pain, keep exercise  OBJECTIVE:   PATIENT SURVEYS:  07/22/2023 FOTO intake:  64  predicted:  71  COGNITION: 07/22/2023 Overall cognitive status: WFL    SENSATION: 07/22/2023 Not tested  EDEMA:  07/22/2023 No observed edema  MUSCLE LENGTH: 07/22/2023 No specific testing  POSTURE:  07/22/2023 rounded shoulders  PALPATION: 07/22/2023 Mild tenderness Lt posterior knee joint space, distal hamstring.  Mild tenderness distal triceps tendon/muscle junction  Stiffness noted in humerus/ulna joint mobility Lt   UPPER EXTREMITY ROM:   ROM Right 07/22/2023 Left 07/22/2023  Shoulder flexion    Shoulder extension    Shoulder abduction    Shoulder adduction    Shoulder extension    Shoulder internal rotation    Shoulder external rotation    Elbow flexion  WFL with discomfort noted  Elbow extension  24 AROM/PROM  Wrist flexion    Wrist extension    Wrist  ulnar deviation    Wrist radial deviation    Wrist pronation    Wrist supination     (Blank rows = not tested)   UPPER EXTREMITY MMT:  MMT Right 07/22/2023 Left 07/22/2023  Shoulder flexion    Shoulder extension    Shoulder abduction    Shoulder adduction    Shoulder extension    Shoulder internal rotation    Shoulder external rotation    Middle trapezius    Lower trapezius    Elbow flexion 5/5 5/5  Elbow extension 5/5 4/5  Wrist flexion  5/5  Wrist extension  5/5  Wrist ulnar deviation    Wrist radial deviation    Wrist pronation  5/5  Wrist supination  5/5  Grip strength (3rd slot testing) 50.9, 47 lbs 44.7, 46 lbs   (Blank rows = not tested)   LOWER EXTREMITY ROM:   ROM Right 07/22/2023 Left 07/22/2023  Hip  flexion    Hip extension    Hip abduction    Hip adduction    Hip internal rotation    Hip external rotation    Knee flexion [facility] WFL soft tissue approximation with discomfort noted  Knee extension [facility]  Ankle dorsiflexion    Ankle plantarflexion    Ankle inversion    Ankle eversion     (Blank rows = not tested)  LOWER EXTREMITY MMT:  MMT Right 07/22/2023 Left 07/22/2023  Hip flexion 5/5 4/5  Hip extension    Hip abduction 5/5 4/5 c pain  Hip adduction    Hip internal rotation    Hip external rotation    Knee flexion 5/5 4+/5  Knee extension 5/5 4/5  Ankle dorsiflexion 5/5 5/5  Ankle plantarflexion    Ankle inversion    Ankle eversion     (Blank rows = not tested)  SPECIAL TESTS:  07/22/2023 No specific testing  FUNCTIONAL TESTS:  07/22/2023 (-) slump Lt  GAIT: 07/22/2023 Independent ambulation                                                                                                                                                                         TODAY'S TREATMENT                                                                          DATE: 07/22/2023 Therex:    HEP instruction/performance c cues for techniques,  handout provided.  Trial set performed of each for comprehension and symptom assessment.  See below for exercise list  Manual Lt elbow posterior glides ulna on humerus.  Active compression to biceps trigger points.   PATIENT EDUCATION:  07/22/2023 Education details: HEP, POC Person educated: Patient Education method: Programmer, multimedia, Demonstration, Verbal cues, and Handouts Education comprehension: verbalized understanding, returned demonstration, and verbal cues required  HOME EXERCISE PROGRAM: Access Code: [email or web address] URL: [email or web address] Date: 07/22/2023 Prepared by: Chyrel Masson  Exercises - Seated Quad Set  - 3-5 x daily - 7 x weekly - 1 sets - 10 reps - 5 hold - Seated Straight Leg Heel Taps  - 1-2 x daily - 7 x weekly - 3 sets - 10 reps - Supine Knee Extension Mobilization with Weight (Mirrored)  - 4-5 x daily - 7 x weekly - 1 sets - 1 reps - to tolerance up to 15 mins hold - Standing Tricep Extensions with Resistance (Mirrored)  - 1-2 x daily -  7 x weekly - 3 sets - 10 reps - Supine Elbow Extension Stretch in Supination  - 2-3 x daily - 7 x weekly - 1 sets - 1 reps - 1-2 mins hold - Seated Hamstring Curl with Anchored Resistance (Mirrored)  - 2 x daily - 7 x weekly - 3 sets - 10 reps  ASSESSMENT:  CLINICAL IMPRESSION: Patient is a 66 y.o. who comes to clinic with complaints of Lt knee, Lt elbow/forearm pain with mobility, strength and movement coordination deficits that impair their ability to perform usual daily and recreational functional activities without increase difficulty/symptoms at this time.  Patient to benefit from skilled PT services to address impairments and limitations to improve to previous level of function without restriction secondary to condition.   OBJECTIVE IMPAIRMENTS: Abnormal gait, decreased activity tolerance, decreased balance, decreased endurance, decreased mobility, decreased ROM, decreased strength, hypomobility, increased fascial  restrictions, impaired perceived functional ability, impaired flexibility, improper body mechanics, postural dysfunction, and pain.   ACTIVITY LIMITATIONS: carrying, lifting, sitting, standing, squatting, and reach over head  PARTICIPATION LIMITATIONS: interpersonal relationship, community activity, occupation, and exercise  PERSONAL FACTORS:  multiple body parts, DDD, DM, hyperlipidemia, HTN  are also affecting patient's functional outcome.   REHAB POTENTIAL: Good  CLINICAL DECISION MAKING: Stable/uncomplicated  EVALUATION COMPLEXITY: Low   GOALS: Goals reviewed with patient? Yes  SHORT TERM GOALS: (target date for Short term goals are 3 weeks 08/12/2023)   1.  Patient will demonstrate independent use of home exercise program to maintain progress from in clinic treatments.  Goal status: New  LONG TERM GOALS: (target dates for all long term goals are 10 weeks  09/30/2023 )   1. Patient will demonstrate/report pain at worst less than or equal to 2/10 to facilitate minimal limitation in daily activity secondary to pain symptoms.  Goal status: New   2. Patient will demonstrate independent use of home exercise program to facilitate ability to maintain/progress functional gains from skilled physical therapy services.  Goal status: New   3. Patient will demonstrate FOTO outcome > or = 71 % to indicate reduced disability due to condition.  Goal status: New   4.  Patient will demonstrate Lt LE MMT 5/5 throughout to faciltiate usual transfers, stairs, squatting, walking at [facility] for daily life.   Goal status: New   5.  Patient will demonstrate Lt elbow AROM WFL s symptoms to facilitate usual mobility at PLOF.  Goal status: New   6.  Patient will demonstrate Lt knee AROM flexion without symptoms to facilitate yoga mobility at PLOF.  Goal status: New     PLAN:  PT FREQUENCY: 1-2x/week  PT DURATION: 10 weeks  PLANNED INTERVENTIONS: Can include 75643- PT Re-evaluation,  97110-Therapeutic exercises, 97530- Therapeutic activity, [id number]- Neuromuscular re-education, 97535- Self Care, 97140- Manual therapy, [phone]- Gait training, [id number]- Orthotic Fit/training, [phone]- Canalith repositioning, [id number]- Aquatic Therapy, 97014- Electrical stimulation (unattended), [id number]- Electrical stimulation (manual), [id number]- Vasopneumatic device, [id number]- Ultrasound, [id number]- Traction (mechanical), [id number]- Ionotophoresis 4mg /ml Dexamethasone, Patient/Family education, Balance training, Stair training, Taping, Dry Needling, Joint mobilization, Joint manipulation, Spinal manipulation, Spinal mobilization, Scar mobilization, Vestibular training, Visual/preceptual remediation/compensation, DME instructions, Cryotherapy, and Moist heat.  All performed as medically necessary.  All included unless contraindicated  PLAN FOR NEXT SESSION: Review HEP knowledge/results.  Improved elbow extension range.    Chyrel Masson, PT, DPT, OCS, ATC 07/22/23  12:17 PM

## 2023-07-22 ENCOUNTER — Other Ambulatory Visit: Payer: Self-pay

## 2023-07-22 ENCOUNTER — Ambulatory Visit: Payer: 59 | Admitting: Rehabilitative and Restorative Service Providers"

## 2023-07-22 ENCOUNTER — Encounter: Payer: Self-pay | Admitting: Rehabilitative and Restorative Service Providers"

## 2023-07-22 DIAGNOSIS — M25522 Pain in left elbow: Secondary | ICD-10-CM | POA: Diagnosis not present

## 2023-07-22 DIAGNOSIS — G8929 Other chronic pain: Secondary | ICD-10-CM | POA: Diagnosis not present

## 2023-07-22 DIAGNOSIS — M25562 Pain in left knee: Secondary | ICD-10-CM | POA: Diagnosis not present

## 2023-07-22 DIAGNOSIS — M6281 Muscle weakness (generalized): Secondary | ICD-10-CM

## 2023-08-03 ENCOUNTER — Encounter: Payer: Self-pay | Admitting: Rehabilitative and Restorative Service Providers"

## 2023-08-03 ENCOUNTER — Ambulatory Visit: Payer: 59 | Admitting: Rehabilitative and Restorative Service Providers"

## 2023-08-03 ENCOUNTER — Encounter: Payer: Self-pay | Admitting: Family Medicine

## 2023-08-03 DIAGNOSIS — E119 Type 2 diabetes mellitus without complications: Secondary | ICD-10-CM

## 2023-08-03 DIAGNOSIS — M6281 Muscle weakness (generalized): Secondary | ICD-10-CM

## 2023-08-03 DIAGNOSIS — G8929 Other chronic pain: Secondary | ICD-10-CM | POA: Diagnosis not present

## 2023-08-03 DIAGNOSIS — M25522 Pain in left elbow: Secondary | ICD-10-CM | POA: Diagnosis not present

## 2023-08-03 DIAGNOSIS — M25562 Pain in left knee: Secondary | ICD-10-CM

## 2023-08-03 MED ORDER — EMPAGLIFLOZIN 25 MG PO TABS: 25.0000 mg | ORAL_TABLET | Freq: Every day | ORAL | 2 refills | Status: DC

## 2023-08-03 NOTE — Therapy (Signed)
OUTPATIENT PHYSICAL THERAPY TREATMENT   Patient Name: Tracey Santiago MRN: [id number] DOB:08-Nov-1955, 67 y.o., female Today's Date: 08/03/2023  END OF SESSION:  PT End of Session - 08/03/23 0933     Visit Number 2    Number of Visits 20    Date for PT Re-Evaluation 09/30/23    Authorization Type [employer] $35 copay, 60 visits    Progress Note Due on Visit 10    PT Start Time 0930    PT Stop Time 1009    PT Time Calculation (min) 39 min    Activity Tolerance Patient tolerated treatment well    Behavior During Therapy WFL for tasks assessed/performed              Past Medical History:  Diagnosis Date   Anemia    Anxiety    "mild"   Arthritis    spine, right hand, hip   Carpal tunnel syndrome, bilateral    no problems s/p surgery bilaterally   Cervical cancer (HCC) 2005   DDD (degenerative disc disease), cervical    C4-5, C5-6, some oseophytosis, foraminal narrowing C3-4, C4-5   Diabetes mellitus without complication (HCC)    Hyperlipidemia    mild   Hypertension    Impaired fasting glucose    DM diagnosed 07/2015 for 2 fasting glucose >126   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 placement   BASAL CELL CARCINOMA EXCISION  02/09/2018   basal cell carinoma, superficial an nodular patterns   BREAST SURGERY  1987   breast augmentation, implants replaced 11/2008   CARPAL TUNNEL RELEASE  11/07/2011   Procedure: CARPAL TUNNEL RELEASE;  Surgeon: Wyn Forster., MD;  Location: [facility];  Service: Orthopedics;  Laterality: Left;   CARPAL TUNNEL RELEASE  04/08/2012   Procedure: CARPAL TUNNEL RELEASE;  Surgeon: Wyn Forster., MD;  Location: [facility];  Service: Orthopedics;  Laterality: Right;   CATARACT EXTRACTION, BILATERAL  Feb and  March 2019   Dr. Lonia Chimera   CESAREAN SECTION     childbirth     x3- 2 NVD, 1 c-section   FOOT SURGERY Right 11/16/2018   Dr. Charlsie Merles (between 4rh and 5th toes)   HAMMER TOE SURGERY Left 06/2012   POSTERIOR LAMINECTOMY / DECOMPRESSION LUMBAR SPINE  07/21/2011   arthrodesis L4-5 (Dr. Danielle Dess)   TONSILLECTOMY     TOTAL HIP ARTHROPLASTY Right 12/13/2013   Procedure: RIGHT TOTAL HIP ARTHROPLASTY ANTERIOR APPROACH;  Surgeon: Shelda Pal, MD;  Location: [facility];  Service: Orthopedics;  Laterality: Right;   Patient Active Problem List   Diagnosis Date Noted   History of cervical cancer 07/08/2023   Type 2 diabetes mellitus without complication, without long-term current use of insulin (HCC) 08/15/2015   Osteopenia 02/07/2015   S/P right THA, AA 12/13/2013   Insomnia 01/31/2013   Impaired fasting glucose 08/04/2012   Hyperlipidemia 08/04/2012   Acquired spondylolisthesis 07/21/2011    Class: Diagnosis of   Spinal stenosis, lumbar region, with neurogenic claudication 07/21/2011   Carpal tunnel syndrome 08/30/2009   ARTHRITIS, CERVICAL SPINE 05/07/2009   PARESTHESIA 05/07/2009   SPONDYLOLISTHESIS 03/23/2009   METATARSALGIA 03/15/2009   CAVUS DEFORMITY OF FOOT, ACQUIRED 03/15/2009   HAMMER TOE 03/15/2009    PCP: Joselyn Arrow MD  REFERRING PROVIDER: Clementeen Graham  S, MD  REFERRING DIAG: [phone] (ICD-10-CM) - Chronic pain of left knee M25.522 (ICD-10-CM) - Left elbow pain  THERAPY DIAG:  Pain in left elbow  Chronic pain of left knee  Muscle weakness (generalized)  Rationale for Evaluation and Treatment: Rehabilitation  ONSET DATE: Acute on chronic around Sept/oct 2024  SUBJECTIVE:   SUBJECTIVE STATEMENT: Pt indicated arm has been feeling some better.  Leg stuff was reported about the same.   Reported less pain in elbow.   PERTINENT HISTORY: DDD, DM, hyperlipidemia, HTN.  Reported borderline osteoporosis.   PAIN:  NPRS scale: in last day or two:  knee at worst 6-77/10,  elbow at worst:  3/10 Pain location: Lt knee (back of knee), Lt elbow  Pain description: stiffness/ache for knee  Aggravating factors: knee:  prolonged sitting/stationary time.     Elbow: lifting, carrying, full extension, WB Relieving factors: resting from painful exercises  PRECAUTIONS: None  WEIGHT BEARING RESTRICTIONS: No  FALLS:  Has patient fallen in last 6 months? No  LIVING ENVIRONMENT: Stairs: has stairs but not painful  OCCUPATION: Work in Sport and exercise psychologist  PLOF: Independent, Rt hand dominant.  Yoga and exercise routine.   PATIENT GOALS: Reduce pain, keep exercise  OBJECTIVE:   PATIENT SURVEYS:  07/22/2023 FOTO intake:  64  predicted:  71  COGNITION: 07/22/2023 Overall cognitive status: WFL    SENSATION: 07/22/2023 Not tested  EDEMA:  07/22/2023 No observed edema  MUSCLE LENGTH: 07/22/2023 No specific testing  POSTURE:  07/22/2023 rounded shoulders  PALPATION: 07/22/2023 Mild tenderness Lt posterior knee joint space, distal hamstring.  Mild tenderness distal triceps tendon/muscle junction  Stiffness noted in humerus/ulna joint mobility Lt   UPPER EXTREMITY ROM:   ROM Right 07/22/2023 Left 07/22/2023 Left 08/03/2023  Shoulder flexion     Shoulder extension     Shoulder abduction     Shoulder adduction     Shoulder extension     Shoulder internal rotation     Shoulder external rotation     Elbow flexion  WFL with discomfort noted   Elbow extension  24 AROM/PROM 15 AROM  Wrist flexion     Wrist extension     Wrist ulnar deviation     Wrist radial deviation     Wrist pronation     Wrist supination      (Blank rows = not tested)   UPPER EXTREMITY MMT:  MMT Right 07/22/2023 Left 07/22/2023  Shoulder flexion    Shoulder extension    Shoulder abduction    Shoulder adduction    Shoulder extension    Shoulder internal rotation    Shoulder external rotation    Middle trapezius    Lower trapezius    Elbow flexion 5/5 5/5  Elbow extension 5/5  4/5  Wrist flexion  5/5  Wrist extension  5/5  Wrist ulnar deviation    Wrist radial deviation    Wrist pronation  5/5  Wrist supination  5/5  Grip strength (3rd slot testing) 50.9, 47 lbs 44.7, 46 lbs   (Blank rows = not tested)   LOWER EXTREMITY ROM:   ROM Right 07/22/2023 Left 07/22/2023  Hip flexion    Hip extension    Hip abduction    Hip adduction    Hip internal rotation    Hip external rotation    Knee flexion [facility] WFL soft tissue approximation with discomfort noted  Knee extension [facility]  Ankle dorsiflexion    Ankle plantarflexion    Ankle inversion  Ankle eversion     (Blank rows = not tested)  LOWER EXTREMITY MMT:  MMT Right 07/22/2023 Left 07/22/2023  Hip flexion 5/5 4/5  Hip extension    Hip abduction 5/5 4/5 c pain  Hip adduction    Hip internal rotation    Hip external rotation    Knee flexion 5/5 4+/5  Knee extension 5/5 4/5  Ankle dorsiflexion 5/5 5/5  Ankle plantarflexion    Ankle inversion    Ankle eversion     (Blank rows = not tested)  SPECIAL TESTS:  07/22/2023 No specific testing  FUNCTIONAL TESTS:  07/22/2023 (-) slump Lt  GAIT: 07/22/2023 Independent ambulation                                                                                                                                                                         TODAY'S TREATMENT                                                                          DATE: 08/03/2023 Therex:  Supine Lt leg hip extension isometric into table 10 sec hold x 10 Lateral step down 6 inch step 2 x 10 bilaterally Standing hip hike 3 sec hold x 15 bilaterally with hand assist on bar Eccentric elbow ext blue band 2x10, performed bilat  Verbal review of existing HEP  Manual Lt elbow posterior glides ulna on humerus g3.  Active compression to biceps trigger points.  TODAY'S TREATMENT                                                                          DATE: 07/22/2023 Therex:     HEP instruction/performance c cues for techniques, handout provided.  Trial set performed of each for comprehension and symptom assessment.  See below for exercise list  Manual Lt elbow posterior glides ulna on humerus.  Active compression to biceps trigger points.   PATIENT EDUCATION:  07/22/2023 Education details: HEP, POC Person educated: Patient Education method: Programmer, multimedia, Demonstration, Verbal cues, and Handouts Education comprehension: verbalized understanding, returned demonstration, and verbal cues required  HOME EXERCISE PROGRAM: Access Code: [email or web address] URL: [email or web address] Date: 07/22/2023 Prepared by: Chyrel Masson  Exercises -  Seated Quad Set  - 3-5 x daily - 7 x weekly - 1 sets - 10 reps - 5 hold - Seated Straight Leg Heel Taps  - 1-2 x daily - 7 x weekly - 3 sets - 10 reps - Supine Knee Extension Mobilization with Weight (Mirrored)  - 4-5 x daily - 7 x weekly - 1 sets - 1 reps - to tolerance up to 15 mins hold - Standing Tricep Extensions with Resistance (Mirrored)  - 1-2 x daily - 7 x weekly - 3 sets - 10 reps - Supine Elbow Extension Stretch in Supination  - 2-3 x daily - 7 x weekly - 1 sets - 1 reps - 1-2 mins hold - Seated Hamstring Curl with Anchored Resistance (Mirrored)  - 2 x daily - 7 x weekly - 3 sets - 10 reps  ASSESSMENT:  CLINICAL IMPRESSION: Elbow range improved mildly today compared to eval.  Evidence of strength deficits and control deficits related to Lt leg compared to Rt as reported and observed in strengthening intervention.  Continued skilled PT services indicated at this time.    OBJECTIVE IMPAIRMENTS: Abnormal gait, decreased activity tolerance, decreased balance, decreased endurance, decreased mobility, decreased ROM, decreased strength, hypomobility, increased fascial restrictions, impaired perceived functional ability, impaired flexibility, improper body mechanics, postural dysfunction, and pain.   ACTIVITY LIMITATIONS:  carrying, lifting, sitting, standing, squatting, and reach over head  PARTICIPATION LIMITATIONS: interpersonal relationship, community activity, occupation, and exercise  PERSONAL FACTORS:  multiple body parts, DDD, DM, hyperlipidemia, HTN  are also affecting patient's functional outcome.   REHAB POTENTIAL: Good  CLINICAL DECISION MAKING: Stable/uncomplicated  EVALUATION COMPLEXITY: Low   GOALS: Goals reviewed with patient? Yes  SHORT TERM GOALS: (target date for Short term goals are 3 weeks 08/12/2023)   1.  Patient will demonstrate independent use of home exercise program to maintain progress from in clinic treatments.  Goal status: on going 08/03/2023  LONG TERM GOALS: (target dates for all long term goals are 10 weeks  09/30/2023 )   1. Patient will demonstrate/report pain at worst less than or equal to 2/10 to facilitate minimal limitation in daily activity secondary to pain symptoms.  Goal status: New   2. Patient will demonstrate independent use of home exercise program to facilitate ability to maintain/progress functional gains from skilled physical therapy services.  Goal status: New   3. Patient will demonstrate FOTO outcome > or = 71 % to indicate reduced disability due to condition.  Goal status: New   4.  Patient will demonstrate Lt LE MMT 5/5 throughout to faciltiate usual transfers, stairs, squatting, walking at [facility] for daily life.   Goal status: New   5.  Patient will demonstrate Lt elbow AROM WFL s symptoms to facilitate usual mobility at PLOF.  Goal status: New   6.  Patient will demonstrate Lt knee AROM flexion without symptoms to facilitate yoga mobility at PLOF.  Goal status: New     PLAN:  PT FREQUENCY: 1-2x/week  PT DURATION: 10 weeks  PLANNED INTERVENTIONS: Can include 16109- PT Re-evaluation, 97110-Therapeutic exercises, 97530- Therapeutic activity, [id number]- Neuromuscular re-education, 97535- Self Care, 97140- Manual therapy, [phone]- Gait  training, [phone]- Orthotic Fit/training, [phone]- Canalith repositioning, [id number]- Aquatic Therapy, 97014- Electrical stimulation (unattended), [id number]- Electrical stimulation (manual), [id number]- Vasopneumatic device, [id number]- Ultrasound, [id number]- Traction (mechanical), [id number]- Ionotophoresis 4mg /ml Dexamethasone, Patient/Family education, Balance training, Stair training, Taping, Dry Needling, Joint mobilization, Joint manipulation, Spinal manipulation, Spinal mobilization, Scar mobilization, Vestibular training, Visual/preceptual remediation/compensation, DME instructions, Cryotherapy, and Moist  heat.  All performed as medically necessary.  All included unless contraindicated  PLAN FOR NEXT SESSION: Elbow motion gain, progressive strengthening as tolerated for UE/LE.  Chyrel Masson, PT, DPT, OCS, ATC 08/03/23  10:13 AM

## 2023-08-05 ENCOUNTER — Encounter: Payer: Self-pay | Admitting: Family Medicine

## 2023-08-07 ENCOUNTER — Encounter: Payer: Self-pay | Admitting: Family Medicine

## 2023-08-08 ENCOUNTER — Other Ambulatory Visit: Payer: Self-pay | Admitting: Family Medicine

## 2023-08-08 DIAGNOSIS — E119 Type 2 diabetes mellitus without complications: Secondary | ICD-10-CM

## 2023-08-09 ENCOUNTER — Other Ambulatory Visit: Payer: Self-pay | Admitting: Family Medicine

## 2023-08-09 DIAGNOSIS — E119 Type 2 diabetes mellitus without complications: Secondary | ICD-10-CM

## 2023-08-10 NOTE — Progress Notes (Signed)
Left knee x-ray shows mild arthritis.

## 2023-08-10 NOTE — Progress Notes (Signed)
Left elbow x-ray shows mild arthritis and some swelling.

## 2023-08-12 ENCOUNTER — Encounter: Payer: 59 | Admitting: Rehabilitative and Restorative Service Providers"

## 2023-08-19 ENCOUNTER — Encounter: Payer: 59 | Admitting: Rehabilitative and Restorative Service Providers"

## 2023-08-20 NOTE — Progress Notes (Signed)
I, Stevenson Clinch, CMA acting as a scribe for Clementeen Graham, MD.  Tracey Santiago is a 67 y.o. female who presents to [facility] today for cont'd L knee pain. Pt was last seen by Dr. Denyse Amass on 07/20/23 and was advised to use Voltaren gel and was referred to PT, completing 2 visits.  Today, pt reports worsening knee pain yesterday. Sx worse with first standing. Short-term relief with Voltaren Gel and Advil. Denies visible swelling. Gets some relief when putting pressure proximal to the knee.  She notes catching sensation in the knee.  Notes some relief of elbow sx with PT but PT seemed to exacerbate knee sx.   Pertinent review of systems: No fevers or chills  Relevant historical information: Type 2 diabetes pretty well-controlled.  Spondylolisthesis lumbar spine.  Right hip replacement.   Exam:  BP 120/72   Pulse 86   Ht 5\' 6"  (1.676 m)   Wt 121 lb (54.9 kg)   SpO2 100%   BMI 19.53 kg/m  General: Well Developed, well nourished, and in no acute distress.   MSK: Left knee mild effusion otherwise normal.  Normal motion pain with flexion. Intact strength. Stable ligamentous exam. Positive McMurray's test.    Lab and Radiology Results  Procedure: Real-time Ultrasound Guided Injection of left knee joint superior lateral patella space Device: Philips Affiniti 50G/GE Logiq Images permanently stored and available for review in PACS Degeneration of the medial meniscus was visible prior to injection. Verbal informed consent obtained.  Discussed risks and benefits of procedure. Warned about infection, bleeding, hyperglycemia damage to structures among others. Patient expresses understanding and agreement Time-out conducted.   Noted no overlying erythema, induration, or other signs of local infection.   Skin prepped in a sterile fashion.   Local anesthesia: Topical Ethyl chloride.   With sterile technique and under real time ultrasound guidance: 40 mg  of Kenalog and 2 mL of Marcaine injected into knee joint. Fluid seen entering the joint capsule.   Completed without difficulty   Pain immediately resolved suggesting accurate placement of the medication.   Advised to call if fevers/chills, erythema, induration, drainage, or persistent bleeding.   Images permanently stored and available for review in the ultrasound unit.  Impression: Technically successful ultrasound guided injection.    EXAM: LEFT KNEE 3 VIEWS   COMPARISON:  None Available.   FINDINGS: No fracture or malalignment. Minimal patellofemoral degenerative change. No significant effusion   IMPRESSION: Minimal patellofemoral degenerative change.     Electronically Signed   By: Jasmine Pang M.D.   On: 08/08/2023 22:56 I, Clementeen Graham, personally (independently) visualized and performed the interpretation of the images attached in this note.    Assessment and Plan: 67 y.o. female with left knee pain.  The pain is out of proportion to the x-ray.  She has only minimal arthritis changes on x-ray but she is having much more knee pain and dysfunction that I would expect based on her x-ray.  I am concerned that she has a degenerative medial meniscus tear with some mechanical symptoms.  Plan for steroid injection today for pain management but will do an MRI.  MRI should help visualize source of pain such as meniscus tear or worse arthritis and visible on x-ray.  Will determine next steps after the MRI.  She has a very important 50th anniversary trip in May 2025.  We talked about risks and side effects of various medicines.  Today steroid injection will likely increase  her blood sugar.  She should avoid high-dose oral NSAIDs given diabetes.  PDMP not reviewed this encounter. Orders Placed This Encounter  Procedures   [place] LIMITED JOINT SPACE STRUCTURES LOW LEFT(NO LINKED CHARGES)    Order Specific Question:   Reason for Exam (SYMPTOM  OR DIAGNOSIS REQUIRED)    Answer:   left  knee pain    Order Specific Question:   Preferred imaging location?    Answer:   [facility]   MR Knee Left  Wo Contrast    Standing Status:   Future    Standing Expiration Date:   08/20/2024    Order Specific Question:   What is the patient's sedation requirement?    Answer:   No Sedation    Order Specific Question:   Does the patient have a pacemaker or implanted devices?    Answer:   No    Order Specific Question:   Preferred imaging location?    Answer:   [facility] (table limit-550lbs)   No orders of the defined types were placed in this encounter.    Discussed warning signs or symptoms. Please see discharge instructions. Patient expresses understanding.   The above documentation has been reviewed and is accurate and complete Clementeen Graham, M.D.

## 2023-08-21 ENCOUNTER — Ambulatory Visit: Payer: 59 | Admitting: Family Medicine

## 2023-08-21 ENCOUNTER — Other Ambulatory Visit: Payer: Self-pay

## 2023-08-21 ENCOUNTER — Encounter: Payer: Self-pay | Admitting: Family Medicine

## 2023-08-21 VITALS — BP 120/72 | HR 86 | Ht 66.0 in | Wt 121.0 lb

## 2023-08-21 DIAGNOSIS — G8929 Other chronic pain: Secondary | ICD-10-CM | POA: Diagnosis not present

## 2023-08-21 DIAGNOSIS — E119 Type 2 diabetes mellitus without complications: Secondary | ICD-10-CM | POA: Diagnosis not present

## 2023-08-21 DIAGNOSIS — M25562 Pain in left knee: Secondary | ICD-10-CM

## 2023-08-21 DIAGNOSIS — M1712 Unilateral primary osteoarthritis, left knee: Secondary | ICD-10-CM | POA: Diagnosis not present

## 2023-08-21 NOTE — Patient Instructions (Addendum)
Thank you for coming in today.   You received an injection today. Seek immediate medical attention if the joint becomes red, extremely painful, or is oozing fluid.   You should hear from MRI scheduling within 1 week. If you do not hear please let me know.

## 2023-08-26 ENCOUNTER — Encounter: Payer: 59 | Admitting: Rehabilitative and Restorative Service Providers"

## 2023-08-31 ENCOUNTER — Encounter: Payer: Self-pay | Admitting: Family Medicine

## 2023-08-31 DIAGNOSIS — E1165 Type 2 diabetes mellitus with hyperglycemia: Secondary | ICD-10-CM

## 2023-09-01 ENCOUNTER — Telehealth: Payer: Self-pay

## 2023-09-01 NOTE — Progress Notes (Signed)
Care Guide Pharmacy Note  09/01/2023 Name: Tracey Santiago MRN: [id number] DOB: Jun 06, 1956  Referred By: Joselyn Arrow, MD Reason for referral: Care Coordination (Outreach to schedule with pharm d )   Tracey Santiago is a 67 y.o. year old female who is a primary care patient of Joselyn Arrow, MD.  Tracey Santiago was referred to the pharmacist for assistance related to: DMII  Successful contact was made with the patient to discuss pharmacy services including being ready for the pharmacist to call at least 5 minutes before the scheduled appointment time and to have medication bottles and any blood pressure readings ready for review. The patient agreed to meet with the pharmacist via telephone visit on (date/time).09/10/2023  Penne Lash , RMA     [facility] [place], [place] [facility] Guide  Direct Dial: [phone]  Website: [email or web address]

## 2023-09-10 ENCOUNTER — Other Ambulatory Visit: Payer: 59

## 2023-09-10 ENCOUNTER — Encounter: Payer: Self-pay | Admitting: Family Medicine

## 2023-09-10 NOTE — Progress Notes (Signed)
   09/10/2023 Name: Tracey Santiago MRN: [id number] DOB: 1956/02/25  No chief complaint on file.   Tracey Santiago is a 67 y.o. year old female who presented for a telephone visit.   They were referred to the pharmacist by their PCP for assistance in managing diabetes and complex medication management.    Subjective:  Care Team: Primary Care Provider: Joselyn Arrow, MD ; Next Scheduled Visit: 10/12/23  Medication Access/Adherence  Current Pharmacy:  [id number] - [place], [place] - [address] [place] [address] Phone: [phone] Fax: [phone]   Patient reports affordability concerns with their medications: No  Patient reports access/transportation concerns to their pharmacy: No  Patient reports adherence concerns with their medications:  No     Diabetes:  Current medications: Jardiance 25mg , Metformin 1500mg  daily Medications tried in the past: None  Current glucose readings: no lows since starting Jardiance, 144 right now (has eaten already today) usually in the 130s-140s Using Dexcom G7 Sensor/Reader Device  States her predicted GMI is still showing 7.2%  Patient denies hypoglycemic s/sx including dizziness, shakiness, sweating. Patient denies hyperglycemic symptoms including polyuria, polydipsia, polyphagia, nocturia, neuropathy, blurred vision.  Current meal patterns:  - Breakfast: 1 egg, 1 piece of whole grain toast low carb with butter, small bowl of fruit (blueberries/strawberries/blackberries), tangerines - Lunch: if out to eat a chef's salad, leftovers from dinner at work, supplement with high protein bar, a little pb on whole grain crackers - Supper salmon 2x/week with green veggie or salad, small amount of sweet potato fries baked, veggie soup with chicken or beef (veggies and protein) - Snacks: greek yogurt b/t breakfast and lunch, pecans and almonds, cheese and whole grain crackers after lunch/more nuts - Drinks coffee  with creamer, stevia; hot tea with stevia, occassional diet soda, water  Current physical activity: a lot of walking and yoga    Objective:  Lab Results  Component Value Date   HGBA1C 7.2 (H) 07/06/2023    Lab Results  Component Value Date   CREATININE 0.67 07/06/2023   BUN 19 07/06/2023   NA 137 07/06/2023   K 4.7 07/06/2023   CL 98 07/06/2023   CO2 25 07/06/2023    Lab Results  Component Value Date   CHOL 252 (H) 07/06/2023   HDL 139 07/06/2023   LDLCALC 105 (H) 07/06/2023   TRIG 50 07/06/2023   CHOLHDL 1.8 07/06/2023    Medications Reviewed Today   Medications were not reviewed in this encounter       Assessment/Plan:   Diabetes: - Currently uncontrolled - Reviewed long term cardiovascular and renal outcomes of uncontrolled blood sugar - Reviewed goal A1c, goal fasting, and goal 2 hour post prandial glucose - Reviewed dietary modifications including high protein/low carb, encouraged her continued diet - Recommend to continue current medication therapy as it can take up to 24 weeks for jardiance to show its maximum effect. Can consider increasing Metformin to 2 tabs BID in future, patient is open to that idea  - Patient denies personal or family history of multiple endocrine neoplasia type 2, medullary thyroid cancer; personal history of pancreatitis or gallbladder disease. - Recommend to download Dexcom Clarity app so office can have remote access to sugar readings   Follow Up Plan: 2 months (sees PCP in 1 month)  Sherrill Raring, PharmD Clinical Pharmacist [phone]

## 2023-09-18 ENCOUNTER — Ambulatory Visit
Admission: RE | Admit: 2023-09-18 | Discharge: 2023-09-18 | Disposition: A | Discharge status: Home / Self Care | Payer: 59 | Source: Ambulatory Visit | Attending: Family Medicine | Admitting: Family Medicine

## 2023-09-18 DIAGNOSIS — G8929 Other chronic pain: Secondary | ICD-10-CM

## 2023-09-19 ENCOUNTER — Other Ambulatory Visit: Payer: Self-pay | Admitting: Family Medicine

## 2023-09-21 ENCOUNTER — Other Ambulatory Visit: Payer: Self-pay | Admitting: Family Medicine

## 2023-09-21 NOTE — Telephone Encounter (Signed)
 You'll need to verify with the patient--advise that change was recommended based on insurance coverage.

## 2023-09-22 ENCOUNTER — Other Ambulatory Visit: Payer: Self-pay | Admitting: *Deleted

## 2023-09-22 ENCOUNTER — Telehealth: Payer: Self-pay | Admitting: Family Medicine

## 2023-09-22 DIAGNOSIS — E119 Type 2 diabetes mellitus without complications: Secondary | ICD-10-CM

## 2023-09-22 MED ORDER — DEXCOM G7 SENSOR MISC: 2 refills | Status: DC

## 2023-09-22 MED ORDER — DEXCOM G7 RECEIVER DEVI: 1.0000 | 0 refills | Status: AC

## 2023-09-22 NOTE — Telephone Encounter (Signed)
 Sent!

## 2023-09-22 NOTE — Telephone Encounter (Signed)
 Pt left voice mail  Free Libre NOT covered by her insurance, she needs rx Dexcom she checked with insurance and it is still covered by her insurance

## 2023-09-23 ENCOUNTER — Ambulatory Visit: Payer: 59 | Admitting: Dietician

## 2023-09-28 ENCOUNTER — Encounter: Payer: Self-pay | Admitting: Family Medicine

## 2023-09-28 NOTE — Progress Notes (Signed)
 Left knee MRI shows medium arthritis and areas of full-thickness cartilage loss in the knee joint that I like potholes.  This is the source of pain.  The MRI shows much worse arthritis than the x-ray did.  How are you feeling after the cortisone shot in early December?  Additionally you have a little bit of a calf strain.

## 2023-10-11 NOTE — Progress Notes (Unsigned)
No chief complaint on file.   Diabetes:  Last A1c was 7.2% in October 2024, on 1500 mg of Metformin (had been 7% in July). She had some diarrhea related to Metformin, and taking metamucil daily was recommended. [place] Pepper was added in October.  She had sent messages that her sugars weren't improving. She saw nutritionist in October, and is scheduled again for January. She has seen [facility] pharmacist, Marylene Land, and has f/u next month. Last visit was 12/26. Sugars were above goal at that time. Marylene Land suggested continuing same medications, as it can take up to 24 weeks for maximal effect of Jardiance. Consider increasing metformin to 2 BID, if needed. She is using Dexcom G7 (Freestyle isn't covered by insurance), and sugars are running ***    Sugars increase the moment she wakes up, before even eating. ***UPDATE She gets regular exercise.  She denies polydipsia, polyuria, numbness, tingling. Checks feet regularly, no concerns. She denies vaginal discharge/itching or urinary complaints. Last diabetic eye exam was 10/2022, no retinopathy.   L knee pain:  She has seen Dr. Denyse Amass and got a cortisone injection 12/6.  MRI 09/18/23 showed more degenerative changes than on plain x-ray. IMPRESSION: 1. No meniscal or ligamentous injury of the left knee. 2. Moderate partial-thickness cartilage loss of the medial patellar facet with areas of full-thickness cartilage loss and subchondral marrow edema. Mild partial-thickness cartilage loss of the medial trochlea. 3. Mild muscle strain of the lateral gastrocnemius muscle. 4. Intact ACL with mucinous degeneration.   PMH, PSH, SH reviewed  ROS: no fever, chills, vomiting or diarrhea.  No chest pain, shortness of breath, edema. No urinary complaints. No vaginal discharge or itching. No bleeding, bruising or rash. Moods are good.  L knee pain L elbow pain? Bowels?   PHYSICAL EXAM:  There were no vitals taken for this visit.  Wt Readings from Last 3  Encounters:  08/21/23 121 lb (54.9 kg)  07/20/23 120 lb (54.4 kg)  07/08/23 118 lb 6.4 oz (53.7 kg)   Pleasant, well-appearing, thin female in no distress HEENT: conjunctiva and sclera are clear, EOMI Neck: no lymphadenopathy, thyromegaly or mass, no bruit Heart: regular rate and rhythm Lungs: clear bilaterally Back: no spinal or CVA tendeness Extremities: no edema Neuro: alert and oriented, cranial nerves grossly intact, normal gait. Psych: normal mood, affect, hygiene and grooming   ASSESSMENT/PLAN:   A1c COVID vaccine?  DM eye exam due in February  Schedule med check (3-4 mos) Has CPE 06/2024

## 2023-10-12 ENCOUNTER — Ambulatory Visit: Payer: 59 | Admitting: Family Medicine

## 2023-10-12 ENCOUNTER — Encounter: Payer: Self-pay | Admitting: Family Medicine

## 2023-10-12 VITALS — BP 120/80 | HR 80 | Ht 65.0 in | Wt 114.8 lb

## 2023-10-12 DIAGNOSIS — R252 Cramp and spasm: Secondary | ICD-10-CM

## 2023-10-12 DIAGNOSIS — E1165 Type 2 diabetes mellitus with hyperglycemia: Secondary | ICD-10-CM | POA: Diagnosis not present

## 2023-10-12 DIAGNOSIS — M1712 Unilateral primary osteoarthritis, left knee: Secondary | ICD-10-CM | POA: Diagnosis not present

## 2023-10-12 LAB — POCT GLYCOSYLATED HEMOGLOBIN (HGB A1C): Hemoglobin A1C: 6.6 % — AB (ref 4.0–5.6)

## 2023-10-12 NOTE — Patient Instructions (Signed)
Be sure to stay well hydrated (as this contributes to cramps). You can continue the topical creams. If having diarrhea, you may want to cut back on the oral Mg, which can contribute to diarrhea sometimes.  Your sugars are better. Let's stay the course with the current medications (keeping the metformin at 3/day).

## 2023-10-20 ENCOUNTER — Other Ambulatory Visit: Payer: Self-pay | Admitting: Family Medicine

## 2023-10-20 DIAGNOSIS — M545 Low back pain, unspecified: Secondary | ICD-10-CM

## 2023-10-30 ENCOUNTER — Other Ambulatory Visit: Payer: Self-pay | Admitting: Family Medicine

## 2023-10-30 DIAGNOSIS — E119 Type 2 diabetes mellitus without complications: Secondary | ICD-10-CM

## 2023-10-31 ENCOUNTER — Other Ambulatory Visit: Payer: Self-pay | Admitting: Family Medicine

## 2023-10-31 DIAGNOSIS — E119 Type 2 diabetes mellitus without complications: Secondary | ICD-10-CM

## 2023-11-03 ENCOUNTER — Other Ambulatory Visit: Payer: Self-pay | Admitting: Family Medicine

## 2023-11-03 DIAGNOSIS — E119 Type 2 diabetes mellitus without complications: Secondary | ICD-10-CM

## 2023-11-05 ENCOUNTER — Ambulatory Visit: Payer: 59 | Admitting: Family Medicine

## 2023-11-05 ENCOUNTER — Encounter: Payer: Self-pay | Admitting: Family Medicine

## 2023-11-05 ENCOUNTER — Other Ambulatory Visit: Payer: Self-pay

## 2023-11-05 VITALS — BP 122/80 | HR 77 | Ht 65.0 in | Wt 119.0 lb

## 2023-11-05 DIAGNOSIS — M1712 Unilateral primary osteoarthritis, left knee: Secondary | ICD-10-CM | POA: Diagnosis not present

## 2023-11-05 DIAGNOSIS — M25562 Pain in left knee: Secondary | ICD-10-CM

## 2023-11-05 DIAGNOSIS — G8929 Other chronic pain: Secondary | ICD-10-CM

## 2023-11-05 NOTE — Progress Notes (Signed)
   I, Stevenson Clinch, CMA acting as a scribe for Clementeen Graham, MD.  Tracey Santiago is a 68 y.o. female who presents to [facility] at [facility] today for re-occurring L knee pain. Pt was last seen by Dr. Denyse Amass on 08/21/23 and was given a L knee steroid injection  and a MRI was ordered.  Today, pt reports about 2.5 months of relief after last injection. Sx worse with knee flexion. Notes increased tightness in the knee over the past 24 hours. Denies visible swelling. Has done well with steroid injection in the past. Some weakness with ascending stairs.   Dx imaging: 09/18/23 L knee MRI  07/20/23 L knee XR  Pertinent review of systems: No fevers or chills  Relevant historical information: Osteopenia.  History left hip replacement   Exam:  BP 122/80   Pulse 77   Ht 5\' 5"  (1.651 m)   Wt 119 lb (54 kg)   SpO2 95%   BMI 19.80 kg/m  General: Well Developed, well nourished, and in no acute distress.   MSK: Left knee mild effusion decreased range of motion lacks full extension.  Intact strength.    Lab and Radiology Results  Procedure: Real-time Ultrasound Guided Injection of left knee joint superior lateral patella space Device: Philips Affiniti 50G/GE Logiq Images permanently stored and available for review in PACS Verbal informed consent obtained.  Discussed risks and benefits of procedure. Warned about infection, bleeding, hyperglycemia damage to structures among others. Patient expresses understanding and agreement Time-out conducted.   Noted no overlying erythema, induration, or other signs of local infection.   Skin prepped in a sterile fashion.   Local anesthesia: Topical Ethyl chloride.   With sterile technique and under real time ultrasound guidance: 40 mg of Kenalog and 2 mL of Marcaine injected into knee joint. Fluid seen entering the joint capsule.   Completed without difficulty   Pain immediately resolved suggesting accurate placement of the medication.    Advised to call if fevers/chills, erythema, induration, drainage, or persistent bleeding.   Images permanently stored and available for review in the ultrasound unit.  Impression: Technically successful ultrasound guided injection.        Assessment and Plan: 68 y.o. 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  Plan for conventional steroid injection today.  She is a bit early for her 65-month time..  I do not expect that today's shot is going to last for 3 months.  Will work on authorization for [employer] which should last longer.  We can do this before her trip in June.  3 months from now would be middle May.   PDMP not reviewed this encounter. Orders Placed This Encounter  Procedures   [place] LIMITED JOINT SPACE STRUCTURES LOW LEFT(NO LINKED CHARGES)    Reason for Exam (SYMPTOM  OR DIAGNOSIS REQUIRED):   left knee pain    Preferred imaging location?:   [facility]   No orders of the defined types were placed in this encounter.    Discussed warning signs or symptoms. Please see discharge instructions. Patient expresses understanding.   The above documentation has been reviewed and is accurate and complete Clementeen Graham, M.D.

## 2023-11-05 NOTE — Patient Instructions (Addendum)
Thank you for coming in today.  You received an injection today. Seek immediate medical attention if the joint becomes red, extremely painful, or is oozing fluid.  We will work to authorize [employer] [place] a heads up whenever you are ready

## 2023-11-10 ENCOUNTER — Telehealth: Payer: Self-pay

## 2023-11-10 NOTE — Telephone Encounter (Signed)
 Please schedule patient when medication is in stock. Thank you   MONOVISC authorized for Left knee Deductible does not apply Once the OOP is met patient covered at 100% Only one copay per DOS $50 PA approved Reference # [id number] 11/09/23-11/07/24

## 2023-11-12 ENCOUNTER — Other Ambulatory Visit (INDEPENDENT_AMBULATORY_CARE_PROVIDER_SITE_OTHER): Payer: Medicare Other

## 2023-11-12 DIAGNOSIS — E1165 Type 2 diabetes mellitus with hyperglycemia: Secondary | ICD-10-CM

## 2023-11-12 NOTE — Progress Notes (Signed)
 11/12/2023 Name: Tracey Santiago MRN: [id number] DOB: November 14, 1955  Chief Complaint  Patient presents with   Diabetes   Medication Management    Tracey Santiago is a 68 y.o. year old female who presented for a telephone visit.   They were referred to the pharmacist by their PCP for assistance in managing diabetes and complex medication management.    Subjective:  Care Team: Primary Care Provider: Joselyn Arrow, MD ; Next Scheduled Visit: 02/15/24  Medication Access/Adherence  Current Pharmacy:  [phone] - [place], [place] - [address] [place] [address] Phone: [phone] Fax: [phone]   Patient reports affordability concerns with their medications: No  Patient reports access/transportation concerns to their pharmacy: No  Patient reports adherence concerns with their medications:  No     Diabetes:  Current medications: Jardiance 25mg , Metformin 1500mg  daily Medications tried in the past: None  Current glucose readings: no lows since starting Jardiance, noticed a bump up in sugars after a recent steroid injection but resolved quickly. Using Dexcom G7 Sensor/Reader Device  States her predicted GMI is showing 6.8% today.  Patient denies hypoglycemic s/sx including dizziness, shakiness, sweating. Patient denies hyperglycemic symptoms including polyuria, polydipsia, polyphagia, nocturia, neuropathy, blurred vision.  Current meal patterns:  - Breakfast: 1 egg, 1 piece of whole grain toast low carb with butter, small bowl of fruit (blueberries/strawberries/blackberries), tangerines - Lunch: if out to eat a chef's salad, leftovers from dinner at work, supplement with high protein bar, a little pb on whole grain crackers - Supper salmon 2x/week with green veggie or salad, small amount of sweet potato fries baked, veggie soup with chicken or beef (veggies and protein) - Snacks: greek yogurt b/t breakfast and lunch, pecans and almonds, cheese  and whole grain crackers after lunch/more nuts - Drinks coffee with creamer, stevia; hot tea with stevia, occassional diet soda, water  -Has added in a high fiber, low carb breat and hasn't had anymore weight loss  Current physical activity: a lot of walking and yoga    Objective:  Lab Results  Component Value Date   HGBA1C 6.6 (A) 10/12/2023    Lab Results  Component Value Date   CREATININE 0.67 07/06/2023   BUN 19 07/06/2023   NA 137 07/06/2023   K 4.7 07/06/2023   CL 98 07/06/2023   CO2 25 07/06/2023    Lab Results  Component Value Date   CHOL 252 (H) 07/06/2023   HDL 139 07/06/2023   LDLCALC 105 (H) 07/06/2023   TRIG 50 07/06/2023   CHOLHDL 1.8 07/06/2023    Medications Reviewed Today     Reviewed by Sherrill Raring, RPH (Pharmacist) on 11/12/23 at 0915  Med List Status: <None>   Medication Order Taking? Sig Documenting Provider Last Dose Status Informant  ALPRAZolam (XANAX) 0.25 MG tablet [id number] No Take 1-2 tablets (0.25-0.5 mg total) by mouth 3 (three) times daily as needed for anxiety. Joselyn Arrow, MD Taking Active            Med Note Katrinka Blazing, Baruch Goldmann Oct 12, 2023 10:38 AM) As needed  Ascorbic Acid (VITAMIN C PO) [id number] No Take 500 mg by mouth daily. [provider] Taking Active   bimatoprost (LATISSE) 0.03 % ophthalmic solution [id number] No PLACE ONE DROP ON APPLICATOR AND APPLY EVENLY ALONG THE SKIN OF THE UPPER EYELID AT BASE OF EYELASHES ONCE DAILY AT BEDTIME REPEAT PROCEDURE FOR SECOND EYE (USE A CLEAN APPLICATOR). Joselyn Arrow, MD  Taking Active   Continuous Glucose Receiver (DEXCOM G7 RECEIVER) DEVI [id number] No 1 each by Does not apply route as directed. Joselyn Arrow, MD Taking Active   Continuous Glucose Sensor (DEXCOM G7 SENSOR) [place] [id number] No Change every 10 days Joselyn Arrow, MD Taking Active   Cyanocobalamin (B-12 PO) [id number] No Take 1 tablet by mouth daily. [provider] Taking Active   glucose blood test strip  [id number] No 1 each by Other route 2 (two) times daily. One Touch Verio Test Strips Joselyn Arrow, MD Taking Active   JARDIANCE 25 MG TABS tablet [id number] No TAKE 1 TABLET BY MOUTH DAILY BEFORE BREAKFAST. Joselyn Arrow, MD Taking Active   MAGNESIUM PO [id number] No Take 1 tablet by mouth daily. [provider] Taking Active Self           Med Note Delfino Lovett Mar 30, 2023 10:54 AM)    metFORMIN (GLUCOPHAGE-XR) 500 MG 24 hr tablet [id number] No TAKE 3 TABLETS (1,500 MG TOTAL) BY MOUTH DAILY WITH BREAKFAST. THIS CAN BE SPLIT UP DURING THE DAY, AS TOLERATED, IF NEEDED Joselyn Arrow, MD Taking Active   NON FORMULARY [id number] No Take 2 capsules by mouth daily.  [provider] Taking Active            Med Note Katrinka Blazing, Baruch Goldmann Oct 12, 2023 10:37 AM) BONE STRENGTH SUPPLEMENT (orders online)    OneTouch Delica Lancets 33G MISC [id number] No 1 each by Does not apply route 2 (two) times daily. Joselyn Arrow, MD Taking Active   rosuvastatin (CRESTOR) 10 MG tablet [id number] No TAKE 1 TABLET (10 MG TOTAL) BY MOUTH ONCE A WEEK. Joselyn Arrow, MD Taking Active   Zinc 50 MG TABS [id number] No Take 1 tablet by mouth daily. [provider] Taking Active   zolpidem (AMBIEN) 5 MG tablet [id number] No Take 1 tablet (5 mg total) by mouth at bedtime as needed. for sleep Ronnald Nian, MD Taking Active            Med Note Katrinka Blazing, Baruch Goldmann Oct 12, 2023 10:37 AM) 1-2 times weekly (takes 2.5mg )               Assessment/Plan:   Diabetes: - Currently controlled - Reviewed long term cardiovascular and renal outcomes of uncontrolled blood sugar - Reviewed goal A1c, goal fasting, and goal 2 hour post prandial glucose - Reviewed dietary modifications including high protein/low carb, encouraged her continued diet - Continue current medication therapy. Can consider increasing Metformin to 2 tabs BID in future if needed, patient is open to that idea    Follow Up Plan:  03/24/24  Sherrill Raring, PharmD Clinical Pharmacist [phone]

## 2023-11-16 NOTE — Telephone Encounter (Signed)
 Ordered. Will contact patient to schedule once received.

## 2023-11-17 NOTE — Telephone Encounter (Signed)
 Patient's knee is feeling better right know. She will call back to schedule when needed. *please confirm stock before scheduling*

## 2023-11-24 ENCOUNTER — Encounter: Payer: Self-pay | Admitting: Family Medicine

## 2023-11-24 DIAGNOSIS — H02729 Madarosis of unspecified eye, unspecified eyelid and periocular area: Secondary | ICD-10-CM

## 2023-11-24 DIAGNOSIS — G4700 Insomnia, unspecified: Secondary | ICD-10-CM

## 2023-11-24 DIAGNOSIS — F419 Anxiety disorder, unspecified: Secondary | ICD-10-CM

## 2023-11-24 MED ORDER — ALPRAZOLAM 0.25 MG PO TABS: 0.2500 mg | ORAL_TABLET | Freq: Three times a day (TID) | ORAL | 0 refills | Status: DC | PRN

## 2023-11-24 MED ORDER — ZOLPIDEM TARTRATE 5 MG PO TABS
5.0000 mg | ORAL_TABLET | Freq: Every evening | ORAL | 0 refills | Status: DC | PRN
Start: 2023-11-24 — End: 2024-04-28

## 2023-11-24 MED ORDER — BIMATOPROST 0.03 % EX SOLN: CUTANEOUS | 1 refills | Status: DC

## 2023-11-24 NOTE — Telephone Encounter (Signed)
 PDMP reviewed.  Noted to have sooner refill request for alprazolam than usual (usually q5-6 months, this was only 4).  Will monitor.  Meds refilled.

## 2023-11-25 LAB — HM DIABETES EYE EXAM

## 2024-01-19 ENCOUNTER — Other Ambulatory Visit: Payer: Self-pay | Admitting: Family Medicine

## 2024-01-22 ENCOUNTER — Other Ambulatory Visit: Payer: Self-pay | Admitting: Family Medicine

## 2024-01-22 DIAGNOSIS — E119 Type 2 diabetes mellitus without complications: Secondary | ICD-10-CM

## 2024-01-22 NOTE — Telephone Encounter (Signed)
Pt has an appt in June ?

## 2024-02-02 ENCOUNTER — Other Ambulatory Visit: Payer: Self-pay | Admitting: Family Medicine

## 2024-02-02 ENCOUNTER — Encounter: Payer: Self-pay | Admitting: Family Medicine

## 2024-02-02 DIAGNOSIS — E119 Type 2 diabetes mellitus without complications: Secondary | ICD-10-CM

## 2024-02-02 MED ORDER — SCOPOLAMINE 1 MG/3DAYS TD PT72: 1.0000 | MEDICATED_PATCH | TRANSDERMAL | 0 refills | Status: DC

## 2024-02-03 ENCOUNTER — Ambulatory Visit: Admitting: Family Medicine

## 2024-02-03 ENCOUNTER — Other Ambulatory Visit: Payer: Self-pay | Admitting: Family Medicine

## 2024-02-03 ENCOUNTER — Other Ambulatory Visit: Payer: Self-pay

## 2024-02-03 VITALS — BP 118/64 | HR 89 | Ht 65.0 in | Wt 119.0 lb

## 2024-02-03 DIAGNOSIS — G8929 Other chronic pain: Secondary | ICD-10-CM

## 2024-02-03 DIAGNOSIS — M79672 Pain in left foot: Secondary | ICD-10-CM | POA: Diagnosis not present

## 2024-02-03 DIAGNOSIS — M1712 Unilateral primary osteoarthritis, left knee: Secondary | ICD-10-CM | POA: Diagnosis not present

## 2024-02-03 DIAGNOSIS — E119 Type 2 diabetes mellitus without complications: Secondary | ICD-10-CM

## 2024-02-03 DIAGNOSIS — M25562 Pain in left knee: Secondary | ICD-10-CM | POA: Diagnosis not present

## 2024-02-03 MED ORDER — HYALURONAN 88 MG/4ML IX SOSY
88.0000 mg | PREFILLED_SYRINGE | Freq: Once | INTRA_ARTICULAR | Status: AC
  Administered 2024-02-03: 88 mg via INTRA_ARTICULAR

## 2024-02-03 NOTE — Patient Instructions (Addendum)
 Thank you for coming in today.   You received an injection today. Seek immediate medical attention if the joint becomes red, extremely painful, or is oozing fluid.   Metatarsal pads at [facility] Fe[facility]  If not better, I can do a metatarsal steroid injection

## 2024-02-03 NOTE — Progress Notes (Signed)
 Joanna Muck, PhD, LAT, ATC acting as a scribe for Garlan Juniper, MD.  Tracey Santiago is a 68 y.o. female who presents to [facility] at [facility] today for exacerbation of her L knee pain. Pt was last seen by Dr. Alease Hunter on 11/05/23 and was given a L knee steroid injection.  Today, pt reports prior steroid injection was very beneficial. She is leaving for a trip on 6/4. She is starting to feel "twinges" of pain w/ activities.   She also notes a hx of Morton's neuroma in her L foot intermittently for years.   Dx imaging: 09/18/23 L knee MRI             07/20/23 L knee XR  Pertinent review of systems: no fever or chills  Relevant historical information: Diabetes.  History right hip replacement.  History lumbar spinal stenosis.   Exam:  BP 118/64   Pulse 89   Ht 5\' 5"  (1.651 m)   Wt 119 lb (54 kg)   SpO2 95%   BMI 19.80 kg/m  General: Well Developed, well nourished, and in no acute distress.   MSK: Left knee mild effusion.  Normal-appearing otherwise.  Normal motion.  Left foot: Broad forefoot short foot length.  High arch.  Transverse arch is collapsed. Callus formation and tenderness to palpation at plantar 2nd and 3rd metatarsal heads.    Lab and Radiology Results  Tracey Santiago presents to clinic today for Monovisc injection left knee 1/1 Procedure: Real-time Ultrasound Guided Injection of right knee joint superior lateral patellar space Device: Philips Affiniti 50G/GE Logiq Images permanently stored and available for review in PACS Verbal informed consent obtained.  Discussed risks and benefits of procedure. Warned about infection, bleeding, damage to structures among others. Patient expresses understanding and agreement Time-out conducted.   Noted no overlying erythema, induration, or other signs of local infection.   Skin prepped in a sterile fashion.   Local anesthesia: Topical Ethyl chloride.   With sterile technique and under real time  ultrasound guidance: Monovisc 4 mL injected into knee joint. Fluid seen entering the joint capsule.   Completed without difficulty   Advised to call if fevers/chills, erythema, induration, drainage, or persistent bleeding.   Images permanently stored and available for review in the ultrasound unit.  Impression: Technically successful ultrasound guided injection. Lot number: [id number]      Assessment and Plan: 68 y.o. female with left knee pain due to DJD.  Plan for Monovisc injection today.  Additionally she left foot pain due to either a Morton's neuroma or metatarsalgia or both.  Plan for trial of metatarsal pads.  If not improving we will proceed with injection near the metatarsal or Morton's neuroma area as early as the next few days.  Happy to work patient in if needed.  This should be done prior to her upcoming trip to [place] on June 4.   PDMP not reviewed this encounter. Orders Placed This Encounter  Procedures   US  LIMITED JOINT SPACE STRUCTURES LOW LEFT(NO LINKED CHARGES)    Reason for Exam (SYMPTOM  OR DIAGNOSIS REQUIRED):   left knee pain    Preferred imaging location?:   [facility]   Meds ordered this encounter  Medications   Hyaluronan (MONOVISC) intra-articular injection 88 mg     Discussed warning signs or symptoms. Please see discharge instructions. Patient expresses understanding.   The above documentation has been reviewed and is accurate and complete Garlan Juniper, M.D.

## 2024-02-04 ENCOUNTER — Encounter: Payer: Self-pay | Admitting: Family Medicine

## 2024-02-09 ENCOUNTER — Encounter: Payer: Self-pay | Admitting: Family Medicine

## 2024-02-09 ENCOUNTER — Other Ambulatory Visit: Payer: Self-pay

## 2024-02-09 ENCOUNTER — Ambulatory Visit: Admitting: Family Medicine

## 2024-02-09 VITALS — BP 130/80 | HR 88 | Ht 65.0 in | Wt 119.0 lb

## 2024-02-09 DIAGNOSIS — G5762 Lesion of plantar nerve, left lower limb: Secondary | ICD-10-CM | POA: Diagnosis not present

## 2024-02-09 DIAGNOSIS — M79672 Pain in left foot: Secondary | ICD-10-CM

## 2024-02-09 NOTE — Patient Instructions (Addendum)
 Thank you for coming in today.   You received an injection today. Seek immediate medical attention if the joint becomes red, extremely painful, or is oozing fluid.   Take it easy today.  Check back as needed

## 2024-02-09 NOTE — Progress Notes (Signed)
   I, Miquel Amen, CMA acting as a scribe for Garlan Juniper, MD.  Tracey Santiago is a 68 y.o. female who presents to [facility] at [facility] today for cont'd L foot pain. Pt was last seen by Dr. Alease Hunter on 02/04/24 and was given a Monovisc injection in her L knee and was advised to try MT pads.  Today, pt reports worsening sx with met pad. Had years of relief of sx with injection in the past. Pinching sensation in the foot with shoes.   Pertinent review of systems: No fevers or chills  Relevant historical information: Controlled diabetes   Exam:  BP 130/80   Pulse 88   Ht 5\' 5"  (1.651 m)   Wt 119 lb (54 kg)   SpO2 98%   BMI 19.80 kg/m  General: Well Developed, well nourished, and in no acute distress.   MSK: Left foot callus formation 2nd and 3rd metatarsal heads plantar aspect.  Tender palpation with positive squeeze test.    Lab and Radiology Results  Procedure: Real-time Ultrasound Guided Injection of left foot Morton's neuroma between 2nd and 3rd metatarsal heads Device: Philips Affiniti 50G/GE Logiq Images permanently stored and available for review in PACS Verbal informed consent obtained.  Discussed risks and benefits of procedure. Warned about infection, bleeding, hyperglycemia damage to structures among others. Patient expresses understanding and agreement Time-out conducted.   Noted no overlying erythema, induration, or other signs of local infection.   Skin prepped in a sterile fashion.   Local anesthesia: Topical Ethyl chloride.   With sterile technique and under real time ultrasound guidance: 20 mg of Kenalog  and 1 mL of lidocaine  injected into soft tissue between 2nd and 3rd metatarsal heads near interdigital nerve. Fluid seen entering the soft tissue.   Completed without difficulty   Pain immediately resolved suggesting accurate placement of the medication.   Advised to call if fevers/chills, erythema, induration, drainage, or persistent  bleeding.   Images permanently stored and available for review in the ultrasound unit.  Impression: Technically successful ultrasound guided injection.        Assessment and Plan: 68 y.o. female with left foot pain due to Morton's neuroma.  Significant improvement immediately following injection.  Continue cushioning comfortable shoes.  Check back as needed.   PDMP not reviewed this encounter. Orders Placed This Encounter  Procedures   US  LIMITED JOINT SPACE STRUCTURES LOW LEFT(NO LINKED CHARGES)    Reason for Exam (SYMPTOM  OR DIAGNOSIS REQUIRED):   left foot pain    Preferred imaging location?:   [facility]   No orders of the defined types were placed in this encounter.    Discussed warning signs or symptoms. Please see discharge instructions. Patient expresses understanding.   The above documentation has been reviewed and is accurate and complete Garlan Juniper, M.D.

## 2024-02-11 ENCOUNTER — Other Ambulatory Visit: Payer: Self-pay | Admitting: Family Medicine

## 2024-02-11 ENCOUNTER — Encounter: Payer: Self-pay | Admitting: Family Medicine

## 2024-02-11 DIAGNOSIS — E119 Type 2 diabetes mellitus without complications: Secondary | ICD-10-CM

## 2024-02-11 NOTE — Progress Notes (Unsigned)
   Joanna Muck, PhD, LAT, ATC acting as a scribe for Garlan Juniper, MD.  Tracey Santiago is a 68 y.o. female who presents to [facility] at [facility] today for cont'd L foot pain. Pt was last seen by Dr. Alease Hunter on 02/09/24 and was given a L Morton's neuroma steroid injection between the 2nd-3rd MT heads.   Today, pt reports ***  Dx imaging: 04/10/22 L foot XR  Pertinent review of systems: ***  Relevant historical information: ***   Exam:  There were no vitals taken for this visit. General: Well Developed, well nourished, and in no acute distress.   MSK: ***    Lab and Radiology Results No results found for this or any previous visit (from the past 72 hours). No results found.     Assessment and Plan: 68 y.o. female with ***   PDMP not reviewed this encounter. No orders of the defined types were placed in this encounter.  No orders of the defined types were placed in this encounter.    Discussed warning signs or symptoms. Please see discharge instructions. Patient expresses understanding.   ***

## 2024-02-12 ENCOUNTER — Other Ambulatory Visit: Payer: Self-pay

## 2024-02-12 ENCOUNTER — Ambulatory Visit: Admitting: Family Medicine

## 2024-02-12 DIAGNOSIS — M79672 Pain in left foot: Secondary | ICD-10-CM | POA: Diagnosis not present

## 2024-02-12 NOTE — Patient Instructions (Signed)
 Thank you for coming in today.   You received an injection today. Seek immediate medical attention if the joint becomes red, extremely painful, or is oozing fluid.

## 2024-02-13 ENCOUNTER — Other Ambulatory Visit: Payer: Self-pay | Admitting: Family Medicine

## 2024-02-13 DIAGNOSIS — E119 Type 2 diabetes mellitus without complications: Secondary | ICD-10-CM

## 2024-02-14 NOTE — Progress Notes (Unsigned)
 No chief complaint on file.   Lab Results  Component Value Date   HGBA1C 6.6 (A) 10/12/2023     Diabetes:  Last A1c was 6.6% in 09/2023, down from 7.2% in October 2024.  Jardiance  was added to her 1500 mg of Metformin  in October, 2024, and dose increased to 25 mg in 07/2023.  Sugar are running ***    Very rare hypoglycemia, just at night, lowest was 70. She denies polydipsia, polyuria, numbness, tingling. Checks feet regularly, no concerns. She denies vaginal discharge/itching, rash or urinary complaints. She has occasional diarrhea ***UPDATE  Last diabetic eye exam was in 11/2023, no retinopathy.    She has been seeing Dr. Alease Hunter for left knee pain/OA and Morton's  neuroma She underwent L knee Monovisc injection on 02/03/24, and Mortons neuroma injections recently (between left 2nd and 3rd, and between left 3rd and 4th MT heads) .  She leaves for a [place] cruise from [place] to [place] later this week.     PMH, PSH, SH reviewed      PMH, PSH, SH reviewed   ROS: no fever, chills, URI symptoms, vomiting or diarrhea (only occasionally).  No chest pain, shortness of breath, edema. No urinary complaints. No vaginal discharge or itching. No bleeding, bruising or rash. Moods are good. Infrequent insomnia, relieved by low dose ambien  L knee pain is improved. L foot pain  Leg cramps at night, uses Mg cream and oral Mg ***    PHYSICAL EXAM:  There were no vitals taken for this visit.  Wt Readings from Last 3 Encounters:  02/09/24 119 lb (54 kg)  02/03/24 119 lb (54 kg)  11/05/23 119 lb (54 kg)   10/12/23 114 lb 12.8 oz (52.1 kg)  06/2023  118# 6.4 oz  Pleasant, well-appearing, thin female in no distress HEENT: conjunctiva and sclera are clear, EOMI Neck: no lymphadenopathy, thyromegaly or mass, no bruit Heart: regular rate and rhythm, no murmur Lungs: clear bilaterally Abdomen: soft, nontender, no organomegaly or mass Back: no spinal or CVA tendeness Extremities:  no edema, 2+ pulses Neuro: alert and oriented, cranial nerves grossly intact, normal gait. Psych: normal mood, affect, hygiene and grooming    ASSESSMENT/PLAN:  A1c Prevnar-20 (can do at her CPE if she prefers)   F/u in October as scheduled

## 2024-02-15 ENCOUNTER — Ambulatory Visit: Payer: 59 | Admitting: Family Medicine

## 2024-02-15 ENCOUNTER — Encounter: Payer: Self-pay | Admitting: Family Medicine

## 2024-02-15 VITALS — BP 130/80 | HR 64 | Ht 66.0 in | Wt 116.8 lb

## 2024-02-15 DIAGNOSIS — M1712 Unilateral primary osteoarthritis, left knee: Secondary | ICD-10-CM

## 2024-02-15 DIAGNOSIS — Z5181 Encounter for therapeutic drug level monitoring: Secondary | ICD-10-CM | POA: Diagnosis not present

## 2024-02-15 DIAGNOSIS — E119 Type 2 diabetes mellitus without complications: Secondary | ICD-10-CM

## 2024-02-15 DIAGNOSIS — E1165 Type 2 diabetes mellitus with hyperglycemia: Secondary | ICD-10-CM | POA: Diagnosis not present

## 2024-02-15 LAB — POCT GLYCOSYLATED HEMOGLOBIN (HGB A1C): Hemoglobin A1C: 6 % — AB (ref 4.0–5.6)

## 2024-02-15 MED ORDER — ROSUVASTATIN CALCIUM 10 MG PO TABS
10.0000 mg | ORAL_TABLET | ORAL | 1 refills | Status: DC
Start: 2024-02-15 — End: 2024-04-06

## 2024-02-15 MED ORDER — EMPAGLIFLOZIN 25 MG PO TABS: 25.0000 mg | ORAL_TABLET | Freq: Every day | ORAL | 4 refills | Status: DC

## 2024-02-15 NOTE — Patient Instructions (Signed)
 According to my system (which may not be accurate)--it may save you money to get the Jardiance  through [facility] (which is mail order)--might be $100 for a 90 day supply (rather than $50/month). You can also look online ([email or web address] .com) to see if there are any coupons or copay cards.  Have a wonderful trip.  Enjoy your trip--it is okay if your sugars are above goal for this vacation, as long as they aren't out of control.

## 2024-03-24 ENCOUNTER — Other Ambulatory Visit (INDEPENDENT_AMBULATORY_CARE_PROVIDER_SITE_OTHER): Payer: Medicare Other

## 2024-03-24 DIAGNOSIS — E1165 Type 2 diabetes mellitus with hyperglycemia: Secondary | ICD-10-CM

## 2024-03-24 NOTE — Progress Notes (Signed)
 03/24/2024 Name: Tracey Santiago MRN: [id number] DOB: 01-30-1956  Chief Complaint  Patient presents with   Medication Management   Diabetes    Tracey Santiago is a 68 y.o. year old female who presented for a telephone visit.   They were referred to the pharmacist by their PCP for assistance in managing diabetes and complex medication management.    Subjective:  Care Team: Primary Care Provider: Randol Dawes, MD ; Next Scheduled Visit: 07/11/24  Medication Access/Adherence  [phone] [place], [place] - [address] [place] [address] Phone: [phone] Fax: [phone]   Patient reports affordability concerns with their medications: No  Patient reports access/transportation concerns to their pharmacy: No  Patient reports adherence concerns with their medications:  No     Diabetes:  Current medications: Jardiance  25mg , Metformin  1500mg  daily Medications tried in the past: None  Current glucose readings:  Have improved greatly on the Jardiance  and patient is sticking to healthy diet choices (see below).   Patient denies hypoglycemic s/sx including dizziness, shakiness, sweating. Patient denies hyperglycemic symptoms including polyuria, polydipsia, polyphagia, nocturia, neuropathy, blurred vision.  Current meal patterns:  - Breakfast: 1 egg, 1 piece of whole grain toast low carb with butter, small bowl of fruit (blueberries/strawberries/blackberries), tangerines - Lunch: if out to eat a chef's salad, leftovers from dinner at work, supplement with high protein bar, a little pb on whole grain crackers - Supper salmon 2x/week with green veggie or salad, small amount of sweet potato fries baked, veggie soup with chicken or beef (veggies and protein) - Snacks: greek yogurt b/t breakfast and lunch, pecans and almonds, cheese and whole grain crackers after lunch/more nuts - Drinks coffee with creamer, stevia; hot tea with  stevia, occassional diet soda, water   -Has added in a high fiber, low carb breat and hasn't had anymore weight loss  Current physical activity: a lot of walking and yoga    Objective:  Lab Results  Component Value Date   HGBA1C 6.0 (A) 02/15/2024    Lab Results  Component Value Date   CREATININE 0.67 07/06/2023   BUN 19 07/06/2023   NA 137 07/06/2023   K 4.7 07/06/2023   CL 98 07/06/2023   CO2 25 07/06/2023    Lab Results  Component Value Date   CHOL 252 (H) 07/06/2023   HDL 139 07/06/2023   LDLCALC 105 (H) 07/06/2023   TRIG 50 07/06/2023   CHOLHDL 1.8 07/06/2023    Medications Reviewed Today     Reviewed by Tracey Santiago, RPH (Pharmacist) on 03/24/24 at [phone]  Med List Status: <None>   Medication Order Taking? Sig Documenting Provider Last Dose Status Informant  ALPRAZolam  (XANAX ) 0.25 MG tablet [id number] Yes Take 1-2 tablets (0.25-0.5 mg total) by mouth 3 (three) times daily as needed for anxiety. Tracey Dawes, MD  Active            Med Note [name], LUCIENNE JULIANNA Kitchens Feb 15, 2024 10:44 AM) As needed for sleep, took a few nights ago   Ascorbic Acid  (VITAMIN C  PO) [id number] Yes Take 500 mg by mouth daily. [provider]  Active   bimatoprost  (LATISSE ) 0.03 % ophthalmic solution [id number]  Place one drop on applicator and apply evenly along the skin of the upper eyelid at base of eyelashes once daily at bedtime; repeat procedure for second eye (use a clean applicator). Tracey Dawes, MD  Active   Continuous Glucose Receiver (  DEXCOM G7 RECEIVER) [place] [id number]  1 each by Does not apply route as directed. Tracey Dawes, MD  Active   Continuous Glucose Sensor [facility] G7 [place]) [place] [id number] Yes CHANGE EVERY 10 DAYS Tracey Dawes, MD  Active   Cyanocobalamin (B-12 PO) [id number]  Take 1 tablet by mouth daily. [provider]  Active   empagliflozin  (JARDIANCE ) 25 MG TABS tablet [id number] Yes Take 1 tablet (25 mg total) by mouth daily. Tracey Dawes, MD  Active    MAGNESIUM  PO [id number] Yes Take 1 tablet by mouth daily. [provider]  Active Self           Med Note [name] Mar 30, 2023 10:54 AM)    metFORMIN  (GLUCOPHAGE -XR) 500 MG 24 hr tablet [id number] Yes TAKE 3 TABLETS (1,500 MG TOTAL) BY MOUTH DAILY WITH BREAKFAST. THIS CAN BE SPLIT UP DURING THE DAY, AS TOLERATED, IF NEEDED Tracey Dawes, MD  Active   NON FORMULARY [id number] Yes Take 2 capsules by mouth daily.  [provider]  Active            Med Note [name], LUCIENNE JULIANNA Pablo Oct 12, 2023 10:37 AM) BONE STRENGTH SUPPLEMENT (orders online)    OneTouch Delica Lancets 33G MISC [id number]  1 each by Does not apply route 2 (two) times daily.  Patient not taking: Reported on 02/15/2024   Tracey Dawes, MD  Active            Med Note [name], LUCIENNE JULIANNA Pablo Feb 15, 2024 10:45 AM) Uses dexcon  rosuvastatin  (CRESTOR ) 10 MG tablet [id number] Yes Take 1 tablet (10 mg total) by mouth once a week. Tracey Dawes, MD  Active   scopolamine  (TRANSDERM-SCOP) 1 MG/3DAYS [id number]  Place 1 patch (1.5 mg total) onto the skin every 3 (three) days.  Patient not taking: Reported on 02/15/2024   Tracey Norleen BROCKS, MD  Active            Med Note [name], LUCIENNE JULIANNA Pablo Feb 15, 2024 10:46 AM) Will use on upcoming trip  Zinc 50 MG TABS [id number]  Take 1 tablet by mouth daily. [provider]  Active   zolpidem  (AMBIEN ) 5 MG tablet [id number] Yes Take 1 tablet (5 mg total) by mouth at bedtime as needed. for sleep Tracey Dawes, MD  Active            Med Note [name], LUCIENNE JULIANNA   Mon Feb 15, 2024 10:47 AM) Takes 1/2 tab at a time a couple of times a week              Assessment/Plan:   Diabetes: - Currently controlled - Reviewed long term cardiovascular and renal outcomes of uncontrolled blood sugar - Reviewed goal A1c, goal fasting, and goal 2 hour post prandial glucose - Reviewed dietary modifications including high protein/low carb, encouraged her continued diet - Continue current  medication therapy. Can consider increasing Metformin  to 2 tabs BID in future if needed, patient is open to that idea    Follow Up Plan: 4 months  Jon VEAR Lindau, PharmD Clinical Pharmacist [phone]

## 2024-04-06 ENCOUNTER — Other Ambulatory Visit: Payer: Self-pay | Admitting: Family Medicine

## 2024-04-06 DIAGNOSIS — E1165 Type 2 diabetes mellitus with hyperglycemia: Secondary | ICD-10-CM

## 2024-04-06 MED ORDER — ROSUVASTATIN CALCIUM 10 MG PO TABS: 10.0000 mg | ORAL_TABLET | ORAL | 1 refills | Status: DC

## 2024-04-06 NOTE — Telephone Encounter (Signed)
 Copied from CRM [id number]. Topic: Clinical - Medication Refill >> Apr 06, 2024  3:41 PM Santiya F wrote: Medication: rosuvastatin  (CRESTOR ) 10 MG tablet [[id number]]  Has the patient contacted their pharmacy? Yes  (Agent: If yes, when and what did the pharmacy advise?) contact office   This is the patient's preferred pharmacy:  [id number] [place], [place] - [address] [place] [address] Phone: [phone] Fax: [phone]  Is this the correct pharmacy for this prescription? Yes If no, delete pharmacy and type the correct one.   Has the prescription been filled recently? Yes  Is the patient out of the medication? No  Has the patient been seen for an appointment in the last year OR does the patient have an upcoming appointment? Yes  Can we respond through MyChart? Yes  Agent: Please be advised that Rx refills may take up to 3 business days. We ask that you follow-up with your pharmacy.

## 2024-04-08 ENCOUNTER — Encounter: Payer: Self-pay | Admitting: Podiatry

## 2024-04-08 ENCOUNTER — Ambulatory Visit (INDEPENDENT_AMBULATORY_CARE_PROVIDER_SITE_OTHER): Admitting: Podiatry

## 2024-04-08 DIAGNOSIS — M7752 Other enthesopathy of left foot: Secondary | ICD-10-CM

## 2024-04-08 MED ORDER — TRIAMCINOLONE ACETONIDE 10 MG/ML IJ SUSP
10.0000 mg | Freq: Once | INTRAMUSCULAR | Status: AC
  Administered 2024-04-08: 10 mg via INTRA_ARTICULAR

## 2024-04-09 ENCOUNTER — Encounter: Payer: Self-pay | Admitting: Family Medicine

## 2024-04-09 DIAGNOSIS — G4700 Insomnia, unspecified: Secondary | ICD-10-CM

## 2024-04-09 DIAGNOSIS — E119 Type 2 diabetes mellitus without complications: Secondary | ICD-10-CM

## 2024-04-09 DIAGNOSIS — F419 Anxiety disorder, unspecified: Secondary | ICD-10-CM

## 2024-04-10 NOTE — Progress Notes (Signed)
 Subjective:   Patient ID: Tracey Santiago, female   DOB: 68 y.o.   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  Patient will be seen back to recheck

## 2024-04-28 MED ORDER — METFORMIN HCL ER 500 MG PO TB24: 1500.0000 mg | ORAL_TABLET | Freq: Every day | ORAL | 0 refills | Status: DC

## 2024-04-28 MED ORDER — ZOLPIDEM TARTRATE 5 MG PO TABS: 5.0000 mg | ORAL_TABLET | Freq: Every evening | ORAL | 0 refills | Status: DC | PRN

## 2024-04-28 MED ORDER — ALPRAZOLAM 0.25 MG PO TABS: 0.2500 mg | ORAL_TABLET | Freq: Three times a day (TID) | ORAL | 0 refills | Status: DC | PRN

## 2024-04-30 ENCOUNTER — Other Ambulatory Visit: Payer: Self-pay | Admitting: Family Medicine

## 2024-04-30 DIAGNOSIS — E1165 Type 2 diabetes mellitus with hyperglycemia: Secondary | ICD-10-CM

## 2024-04-30 DIAGNOSIS — E119 Type 2 diabetes mellitus without complications: Secondary | ICD-10-CM

## 2024-05-09 ENCOUNTER — Encounter: Payer: Self-pay | Admitting: Family Medicine

## 2024-06-01 NOTE — Progress Notes (Unsigned)
   LILLETTE Ileana Collet, PhD, LAT, ATC acting as a scribe for Artist Lloyd, MD.  Tracey Santiago is a 68 y.o. female who presents to [facility] today for exacerbation of her L knee pain. Pt was last seen for her knee on 02/03/24 and it was injected w/ Monovisc. Last L knee steroid injection, 11/05/23.  Today, pt reports L knee pain has been returning over the last month. Increase pain w/ transitioning to stand and getting up off the floor.   Dx imaging: 09/18/23 L knee MRI             07/20/23 L knee XR  Pertinent review of systems: No fevers or chills  Relevant historical information: Diabetes   Exam:  BP 124/82   Pulse 89   Ht 5' 6 (1.676 m)   Wt 119 lb (54 kg)   SpO2 98%   BMI 19.21 kg/m  General: Well Developed, well nourished, and in no acute distress.   MSK: Left knee mild effusion normal-appearing otherwise normal motion.    Lab and Radiology Results  Procedure: Real-time Ultrasound Guided Injection of left knee joint superior lateral patella space Device: Philips Affiniti 50G/GE Logiq Images permanently stored and available for review in PACS Verbal informed consent obtained.  Discussed risks and benefits of procedure. Warned about infection, bleeding, hyperglycemia damage to structures among others. Patient expresses understanding and agreement Time-out conducted.   Noted no overlying erythema, induration, or other signs of local infection.   Skin prepped in a sterile fashion.   Local anesthesia: Topical Ethyl chloride.   With sterile technique and under real time ultrasound guidance: 40 mg of Kenalog  and 2 mL of Marcaine  injected into knee joint. Fluid seen entering the joint capsule.   Completed without difficulty   Pain immediately resolved suggesting accurate placement of the medication.   Advised to call if fevers/chills, erythema, induration, drainage, or persistent bleeding.   Images permanently stored and available for review in  the ultrasound unit.  Impression: Technically successful ultrasound guided injection.       Assessment and Plan: 68 y.o. female with left knee pain due to DJD.  Patient had gel injection Feb 03, 2024.  Plan for steroid injection today.  Can reauthorize gel injection as soon of August 06, 2024.  She will let me know when the pain starts again.   PDMP not reviewed this encounter. Orders Placed This Encounter  Procedures   US  LIMITED JOINT SPACE STRUCTURES LOW LEFT(NO LINKED CHARGES)    Reason for Exam (SYMPTOM  OR DIAGNOSIS REQUIRED):   left knee pain    Preferred imaging location?:   [facility]   No orders of the defined types were placed in this encounter.    Discussed warning signs or symptoms. Please see discharge instructions. Patient expresses understanding.   The above documentation has been reviewed and is accurate and complete Artist Lloyd, M.D.

## 2024-06-02 ENCOUNTER — Other Ambulatory Visit: Payer: Self-pay

## 2024-06-02 ENCOUNTER — Ambulatory Visit: Admitting: Family Medicine

## 2024-06-02 VITALS — BP 124/82 | HR 89 | Ht 66.0 in | Wt 119.0 lb

## 2024-06-02 DIAGNOSIS — M25562 Pain in left knee: Secondary | ICD-10-CM | POA: Diagnosis not present

## 2024-06-02 DIAGNOSIS — G8929 Other chronic pain: Secondary | ICD-10-CM

## 2024-06-02 DIAGNOSIS — M1712 Unilateral primary osteoarthritis, left knee: Secondary | ICD-10-CM

## 2024-06-02 NOTE — Patient Instructions (Addendum)
 Thank you for coming in today.   You received an injection today. Seek immediate medical attention if the joint becomes red, extremely painful, or is oozing fluid.   Check back as needed  Work on authorization for the gel injection a week after August 05, 2024.  Please let me know when the pain returns.

## 2024-06-24 LAB — HM MAMMOGRAPHY

## 2024-06-27 ENCOUNTER — Encounter: Payer: Self-pay | Admitting: Family Medicine

## 2024-07-04 ENCOUNTER — Telehealth: Payer: Self-pay

## 2024-07-04 ENCOUNTER — Encounter: Payer: Self-pay | Admitting: Family Medicine

## 2024-07-04 DIAGNOSIS — Z5181 Encounter for therapeutic drug level monitoring: Secondary | ICD-10-CM

## 2024-07-04 DIAGNOSIS — E1165 Type 2 diabetes mellitus with hyperglycemia: Secondary | ICD-10-CM

## 2024-07-04 DIAGNOSIS — E785 Hyperlipidemia, unspecified: Secondary | ICD-10-CM

## 2024-07-04 NOTE — Telephone Encounter (Signed)
 Got patient on schedule for 10/22

## 2024-07-04 NOTE — Telephone Encounter (Signed)
 Copied from CRM #[id number]. Topic: Clinical - Request for Lab/Test Order >> Jul 04, 2024  2:34 PM Myrick T wrote: Reason for CRM: patient called stated she would like to come in on the morning of the 22nd or 24th and do her labs for her CPE appt 10/27. Please f/u with patient

## 2024-07-06 ENCOUNTER — Other Ambulatory Visit

## 2024-07-06 DIAGNOSIS — Z5181 Encounter for therapeutic drug level monitoring: Secondary | ICD-10-CM

## 2024-07-06 DIAGNOSIS — E1165 Type 2 diabetes mellitus with hyperglycemia: Secondary | ICD-10-CM

## 2024-07-06 DIAGNOSIS — E785 Hyperlipidemia, unspecified: Secondary | ICD-10-CM

## 2024-07-07 ENCOUNTER — Encounter: Payer: Self-pay | Admitting: Podiatry

## 2024-07-07 ENCOUNTER — Ambulatory Visit: Admitting: Podiatry

## 2024-07-07 DIAGNOSIS — M7752 Other enthesopathy of left foot: Secondary | ICD-10-CM

## 2024-07-07 DIAGNOSIS — G5762 Lesion of plantar nerve, left lower limb: Secondary | ICD-10-CM | POA: Diagnosis not present

## 2024-07-07 MED ORDER — TRIAMCINOLONE ACETONIDE 10 MG/ML IJ SUSP
10.0000 mg | Freq: Once | INTRAMUSCULAR | Status: AC
  Administered 2024-07-07: 10 mg via INTRA_ARTICULAR
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
## 2024-07-07 NOTE — Patient Instructions (Addendum)
 HEALTH MAINTENANCE RECOMMENDATIONS:  It is recommended that you get at least 30 minutes of aerobic exercise at least 5 days/week (for weight loss, you may need as much as 60-90 minutes). This can be any activity that gets your heart rate up. This can be divided in 10-15 minute intervals if needed, but try and build up your endurance at least once a week.  Weight bearing exercise is also recommended twice weekly.  Eat a healthy diet with lots of vegetables, fruits and fiber.  Colorful foods have a lot of vitamins (ie green vegetables, tomatoes, red peppers, etc).  Limit sweet tea, regular sodas and alcoholic beverages, all of which has a lot of calories and sugar.  Up to 1 alcoholic drink daily may be beneficial for women (unless trying to lose weight, watch sugars).  Drink a lot of water .  Calcium  recommendations are 1200-1500 mg daily (1500 mg for postmenopausal women or women without ovaries), and vitamin D  1000 IU daily.  This should be obtained from diet and/or supplements (vitamins), and calcium  should not be taken all at once, but in divided doses.  Monthly self breast exams and yearly mammograms for women over the age of 51 is recommended.  Sunscreen of at least SPF 30 should be used on all sun-exposed parts of the skin when outside between the hours of 10 am and 4 pm (not just when at beach or pool, but even with exercise, golf, tennis, and yard work!)  Use a sunscreen that says broad spectrum so it covers both UVA and UVB rays, and make sure to reapply every 1-2 hours.  Remember to change the batteries in your smoke detectors when changing your clock times in the spring and fall. Carbon monoxide detectors are recommended for your home.  Use your seat belt every time you are in a car, and please drive safely and not be distracted with cell phones and texting while driving.    This is a list of the screening recommended for you and due dates:  Health Maintenance  Topic Date Due    Flu Shot  04/15/2024   COVID-19 Vaccine (7 - 2025-26 season) 05/16/2024   Yearly kidney health urinalysis for diabetes  07/05/2024   Complete foot exam   07/07/2024   Eye exam for diabetics  11/24/2024   Hemoglobin A1C  01/04/2025   Breast Cancer Screening  06/24/2025   Yearly kidney function blood test for diabetes  07/06/2025   Colon Cancer Screening  05/07/2027   DTaP/Tdap/Td vaccine (3 - Td or Tdap) 03/24/2028   Pneumococcal Vaccine for age over 46  Completed   DEXA scan (bone density measurement)  Completed   Hepatitis C Screening  Completed   Zoster (Shingles) Vaccine  Completed   Meningitis B Vaccine  Aged Out    Increase your rosuvastatin  to three times/week (Mon/Wed/Friday). If you develop any muscle aches, add in a Coenzyme Q10 once daily. If your side effects don't improve, then cut back to 2x/week (and try to make some additional dietary adjustments--decreasing cheese, using lowfat or nonfat dairy products, fewer eggs, red meat, etc).  I recommend getting the COVID booster--you can wait for 3 months since your trip, when you possibly had COVID. You only need to wait a minimum of 30 days. Wait 2 weeks from today's flu shot. We discussed getting Prevnar-20--there is no urgency to that, we can do this at your next visit.  Please bring us  copies of your Living Will and Healthcare Power of [place] and  notarized so that it can be scanned into your medical chart.

## 2024-07-08 LAB — COMPREHENSIVE METABOLIC PANEL WITH GFR
ALT: 15 IU/L (ref 0–32)
AST: 17 IU/L (ref 0–40)
Albumin: 4.4 g/dL (ref 3.9–4.9)
Alkaline Phosphatase: 55 IU/L (ref 49–135)
BUN/Creatinine Ratio: 31 — ABNORMAL HIGH (ref 12–28)
BUN: 23 mg/dL (ref 8–27)
Bilirubin Total: 0.5 mg/dL (ref 0.0–1.2)
CO2: 24 mmol/L (ref 20–29)
Calcium: 9.9 mg/dL (ref 8.7–10.3)
Chloride: 101 mmol/L (ref 96–106)
Creatinine, Ser: 0.74 mg/dL (ref 0.57–1.00)
Globulin, Total: 1.8 g/dL (ref 1.5–4.5)
Glucose: 144 mg/dL — ABNORMAL HIGH (ref 70–99)
Potassium: 4.3 mmol/L (ref 3.5–5.2)
Sodium: 140 mmol/L (ref 134–144)
Total Protein: 6.2 g/dL (ref 6.0–8.5)
eGFR: 87 mL/min/1.73 (ref >59)

## 2024-07-08 LAB — CBC WITH DIFFERENTIAL/PLATELET
Basophils Absolute: 0 x10E3/uL (ref 0.0–0.2)
Basos: 1 %
EOS (ABSOLUTE): 0.1 x10E3/uL (ref 0.0–0.4)
Eos: 2 %
Hematocrit: 43.5 % (ref 34.0–46.6)
Hemoglobin: 14.1 g/dL (ref 11.1–15.9)
Immature Grans (Abs): 0 x10E3/uL (ref 0.0–0.1)
Immature Granulocytes: 0 %
Lymphocytes Absolute: 1.2 x10E3/uL (ref 0.7–3.1)
Lymphs: 27 %
MCH: 32 pg (ref 26.6–33.0)
MCHC: 32.4 g/dL (ref 31.5–35.7)
MCV: 99 fL — ABNORMAL HIGH (ref 79–97)
Monocytes Absolute: 0.5 x10E3/uL (ref 0.1–0.9)
Monocytes: 11 %
Neutrophils Absolute: 2.5 x10E3/uL (ref 1.4–7.0)
Neutrophils: 59 %
Platelets: 258 x10E3/uL (ref 150–450)
RBC: 4.4 x10E6/uL (ref 3.77–5.28)
RDW: 11.7 % (ref 11.7–15.4)
WBC: 4.3 x10E3/uL (ref 3.4–10.8)

## 2024-07-08 LAB — HEMOGLOBIN A1C
Est. average glucose Bld gHb Est-mCnc: 148 mg/dL
Hgb A1c MFr Bld: 6.8 % — ABNORMAL HIGH (ref 4.8–5.6)

## 2024-07-08 LAB — MICROALBUMIN / CREATININE URINE RATIO
Creatinine, Urine: 51.3 mg/dL
Microalb/Creat Ratio: <6 mg/g{creat} (ref 0–29)
Microalbumin, Urine: <3 ug/mL

## 2024-07-08 LAB — LIPID PANEL
Chol/HDL Ratio: 2 ratio (ref 0.0–4.4)
Cholesterol, Total: 271 mg/dL — ABNORMAL HIGH (ref 100–199)
HDL: 135 mg/dL (ref >39)
LDL Chol Calc (NIH): 128 mg/dL — ABNORMAL HIGH (ref 0–99)
Triglycerides: 52 mg/dL (ref 0–149)
VLDL Cholesterol Cal: 8 mg/dL (ref 5–40)

## 2024-07-08 LAB — TSH: TSH: 1.42 u[IU]/mL (ref 0.450–4.500)

## 2024-07-08 NOTE — Progress Notes (Signed)
 Subjective:   Patient ID: Tracey Santiago, female   DOB: 68 y.o.   MRN: [id number]   HPI The patient presents with several problems with 1 being acute inflammation again between the 3rd and 4th toes left recent and inflammation and pain of the second interspace with tentative diagnosis of neuroma   ROS      Objective:  Physical Exam  Neurovascular status intact with inflammation of the inner phalangeal joint digit 3 with stubborn lesion that got better for several months but has reoccurred over the last few weeks with a small plantar lesion also noted and a clicking discomfort between the second interspace left with history of cortisone injection to the area in the last few months     Assessment:  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  For the neuroma wider shoes and possibility for neurolysis and excision may be necessary in future.  I am focusing still on the third toe I did do a careful inner phalangeal joint injection of 1 mg dexamethasone  Kenalog  debrided the lesion and applied cushion and if symptoms all get better than most pain will be coming from here versus neuroma also.  I will see as symptoms indicate

## 2024-07-11 ENCOUNTER — Encounter: Payer: Self-pay | Admitting: Family Medicine

## 2024-07-11 ENCOUNTER — Ambulatory Visit: Payer: 59 | Admitting: Family Medicine

## 2024-07-11 VITALS — BP 118/70 | HR 64 | Ht 66.0 in | Wt 117.0 lb

## 2024-07-11 DIAGNOSIS — M1712 Unilateral primary osteoarthritis, left knee: Secondary | ICD-10-CM

## 2024-07-11 DIAGNOSIS — Z23 Encounter for immunization: Secondary | ICD-10-CM | POA: Diagnosis not present

## 2024-07-11 DIAGNOSIS — Z0000 Encounter for general adult medical examination without abnormal findings: Secondary | ICD-10-CM | POA: Diagnosis not present

## 2024-07-11 DIAGNOSIS — N6459 Other signs and symptoms in breast: Secondary | ICD-10-CM

## 2024-07-11 DIAGNOSIS — E785 Hyperlipidemia, unspecified: Secondary | ICD-10-CM | POA: Diagnosis not present

## 2024-07-11 DIAGNOSIS — M8580 Other specified disorders of bone density and structure, unspecified site: Secondary | ICD-10-CM | POA: Diagnosis not present

## 2024-07-11 DIAGNOSIS — F419 Anxiety disorder, unspecified: Secondary | ICD-10-CM

## 2024-07-11 DIAGNOSIS — G4700 Insomnia, unspecified: Secondary | ICD-10-CM

## 2024-07-11 DIAGNOSIS — E1165 Type 2 diabetes mellitus with hyperglycemia: Secondary | ICD-10-CM

## 2024-07-11 DIAGNOSIS — Z8541 Personal history of malignant neoplasm of cervix uteri: Secondary | ICD-10-CM

## 2024-07-11 DIAGNOSIS — E119 Type 2 diabetes mellitus without complications: Secondary | ICD-10-CM

## 2024-07-11 DIAGNOSIS — Z5181 Encounter for therapeutic drug level monitoring: Secondary | ICD-10-CM

## 2024-07-11 DIAGNOSIS — R252 Cramp and spasm: Secondary | ICD-10-CM

## 2024-07-11 MED ORDER — ROSUVASTATIN CALCIUM 10 MG PO TABS: ORAL_TABLET | ORAL | 0 refills | Status: DC

## 2024-07-11 MED ORDER — METFORMIN HCL ER 500 MG PO TB24: 1500.0000 mg | ORAL_TABLET | Freq: Every day | ORAL | 1 refills | Status: AC

## 2024-07-23 ENCOUNTER — Other Ambulatory Visit: Payer: Self-pay | Admitting: Family Medicine

## 2024-08-04 ENCOUNTER — Other Ambulatory Visit: Payer: Self-pay

## 2024-08-04 DIAGNOSIS — E1165 Type 2 diabetes mellitus with hyperglycemia: Secondary | ICD-10-CM

## 2024-08-04 NOTE — Progress Notes (Signed)
 08/04/2024 Name: Tracey Santiago MRN: [id number] DOB: 06/22/1956  Chief Complaint  Patient presents with   Medication Management   Diabetes    Tracey Santiago is a 68 y.o. year old female who presented for a telephone visit.   They were referred to the pharmacist by their PCP for assistance in managing diabetes and complex medication management.    Subjective:  Care Team: Primary Care Provider: Randol Dawes, MD   Medication Access/Adherence  Current Pharmacy:  [facility] [phone] [facility] [employer] [place], [place] - [address] [place] [place] [address] Phone: [phone] Fax: [phone]   Patient reports affordability concerns with their medications: No  Patient reports access/transportation concerns to their pharmacy: No  Patient reports adherence concerns with their medications:  No     Diabetes:  Current medications: Jardiance  25mg , Metformin  1500mg  daily Medications tried in the past: None  Current glucose readings:  Have improved greatly on the Jardiance  and patient is sticking to healthy diet choices (see below).  BG during time of phone call is 115 Occasional lows at night - lowest 60 - corrects easily with a couple glucose tablets Patient denies hypoglycemic s/sx including dizziness, shakiness, sweating. Patient denies hyperglycemic symptoms including polyuria, polydipsia, polyphagia, nocturia, neuropathy, blurred vision.  Current meal patterns:  - Breakfast: 1 egg, 1 piece of whole grain toast low carb with butter, small bowl of fruit (blueberries/strawberries/blackberries), tangerines - Lunch: if out to eat a chef's salad, leftovers from dinner at work, supplement with high protein bar, a little pb on whole grain crackers - Supper salmon 2x/week with green veggie or salad, small amount of sweet potato fries baked, veggie soup with chicken or beef (veggies and protein) - Snacks: greek yogurt b/t breakfast and lunch, pecans and almonds, cheese and whole  grain crackers after lunch/more nuts - Drinks coffee with creamer, stevia; hot tea with stevia, occassional diet soda, water   -Has added in a high fiber, low carb breast and hasn't had anymore weight loss  Current physical activity: a lot of walking and yoga    Objective:  Lab Results  Component Value Date   HGBA1C 6.8 (H) 07/06/2024    Lab Results  Component Value Date   CREATININE 0.74 07/06/2024   BUN 23 07/06/2024   NA 140 07/06/2024   K 4.3 07/06/2024   CL 101 07/06/2024   CO2 24 07/06/2024    Lab Results  Component Value Date   CHOL 271 (H) 07/06/2024   HDL 135 07/06/2024   LDLCALC 128 (H) 07/06/2024   TRIG 52 07/06/2024   CHOLHDL 2.0 07/06/2024    Medications Reviewed Today     Reviewed by Lionell Jon DEL, RPH (Pharmacist) on 08/04/24 at 0901  Med List Status: <None>   Medication Order Taking? Sig Documenting Provider Last Dose Status Informant  ALPRAZolam  (XANAX ) 0.25 MG tablet [id number]  Take 1-2 tablets (0.25-0.5 mg total) by mouth 3 (three) times daily as needed for anxiety. Randol Dawes, MD  Active            Med Note Tracey Santiago Kitchens Jul 11, 2024 10:01 AM) Has been taking more often for foot cramps, maybe 3 times a week at night  Ascorbic Acid  (VITAMIN C  PO) [id number]  Take 500 mg by mouth daily. [provider]  Active   bimatoprost  (LATISSE ) 0.03 % ophthalmic solution [id number]  Place one drop on applicator and apply evenly along the skin of the upper eyelid at base of eyelashes  once daily at bedtime; repeat procedure for second eye (use a clean applicator). Randol Dawes, MD  Active   Continuous Glucose Receiver [facility] G7 RECEIVER) [place] [id number]  1 each by Does not apply route as directed. Randol Dawes, MD  Active   Continuous Glucose Sensor [facility] G7 [place]) [place] [id number]  CHANGE EVERY 10 DAYS Randol Dawes, MD  Active   Cyanocobalamin (B-12 PO) [id number]  Take 1 tablet by mouth daily. [provider]  Active   JARDIANCE  25 MG  TABS tablet [id number]  TAKE 1 TABLET (25 MG TOTAL) BY MOUTH DAILY. Randol Dawes, MD  Active   MAGNESIUM  PO [id number]  Take 1 tablet by mouth daily. [provider]  Active Self           Med Note Tracey Santiago Pablo Mar 30, 2023 10:54 AM)    metFORMIN  (GLUCOPHAGE -XR) 500 MG 24 hr tablet [id number]  Take 3 tablets (1,500 mg total) by mouth daily with breakfast. This can be split up during the day, as tolerated, if needed Randol Dawes, MD  Active   NON FORMULARY [id number]  Take 2 capsules by mouth daily.  [provider]  Active            Med Note Tracey, LUCIENNE Santiago Pablo Oct 12, 2023 10:37 AM) BONE STRENGTH SUPPLEMENT (orders online)    OneTouch Delica Lancets 33G MISC [id number]  1 each by Does not apply route 2 (two) times daily. Randol Dawes, MD  Active            Med Note Tracey Santiago Pablo Feb 15, 2024 10:45 AM) Uses dexcon  rosuvastatin  (CRESTOR ) 10 MG tablet [id number]  Take 1 tablet by mouth three times/week (Mon/Wed/Fri) Randol Dawes, MD  Active   Zinc 50 MG TABS [id number]  Take 1 tablet by mouth daily. [provider]  Active   zolpidem  (AMBIEN ) 5 MG tablet [id number]  Take 1 tablet (5 mg total) by mouth at bedtime as needed. for sleep Randol Dawes, MD  Active            Med Note Tracey Santiago Pablo Jul 11, 2024 10:03 AM) As needed, couple times a month-less with the xanax  use              Assessment/Plan:   Diabetes: - Currently controlled - Reviewed long term cardiovascular and renal outcomes of uncontrolled blood sugar - Reviewed goal A1c, goal fasting, and goal 2 hour post prandial glucose - Reviewed dietary modifications including high protein/low carb, encouraged her continued diet - Continue current medication therapy. Can consider increasing Metformin  to 2 tabs BID in future if needed, patient is open to that idea. Reach out sooner than sch f/u with if you begin to have low BG or see consistent elevations in BG out of target  range   Follow Up Plan: 6 months  Jon VEAR Lindau, PharmD Clinical Pharmacist [phone]

## 2024-08-05 NOTE — Telephone Encounter (Signed)
 Ran benefits for left knee Monovisc case ID [id number]

## 2024-08-16 ENCOUNTER — Ambulatory Visit: Payer: Self-pay

## 2024-08-16 NOTE — Telephone Encounter (Signed)
 FYI Only or Action Required?: FYI only for provider: appointment scheduled on 08/17/24.  Patient was last seen in primary care on 07/11/2024 by Randol Dawes, MD.  Called Nurse Triage reporting Hip Pain.  Symptoms began a week ago.  Interventions attempted: Rest, hydration, or home remedies.  Symptoms are: unchanged.  Triage Disposition: See PCP When Office is Open (Within 3 Days)  Patient/caregiver understands and will follow disposition?: Yes    Copied from CRM #[id number]. Topic: Clinical - Red Word Triage >> Aug 16, 2024 12:43 PM Joesph NOVAK wrote: Red Word that prompted transfer to Nurse Triage: Patients is experiencing a sharp pain in her waist, she feels like its internal. She states its been happening for a week in the afternoon. She would like to come in Wednesday or Thursday. Reason for Disposition  [1] MODERATE pain (e.g., interferes with normal activities, limping) AND [2] present > 3 days  Answer Assessment - Initial Assessment Questions Pt called in with pain at the waist that only occurs in the evening/ night. She states it feels internal and has been present for 1 week. She thought it was soreness from exercise at first but has not went a way and pain comes and goes; with worsening at night. Appointment scheduled for evaluation. Patient agrees with plan of care, and will call back if anything changes, or if symptoms worsen.     1. LOCATION and RADIATION: Where is the pain located? Does the pain spread (shoot) anywhere else?     Waist; feels internal   2. QUALITY: What does the pain feel like?  (e.g., sharp, dull, aching, burning)     Sharp; feels internal   3. SEVERITY: How bad is the pain? What does it keep you from doing?   (Scale 1-10; or mild, moderate, severe)     7-8  4. ONSET: When did the pain start? Does it come and go, or is it there all the time?     Worse at night  5. WORK OR EXERCISE: Has there been any recent work or exercise that involved  this part of the body?      Still able to walk and exercise; feels nagging when walking   6. CAUSE: What do you think is causing the hip pain?      Unknown   7. AGGRAVATING FACTORS: What makes the hip pain worse? (e.g., walking, climbing stairs, running)     None   8. OTHER SYMPTOMS: Do you have any other symptoms? (e.g., back pain, pain shooting down leg,  fever, rash)     None  Protocols used: Hip Pain-A-AH

## 2024-08-16 NOTE — Progress Notes (Unsigned)
 No chief complaint on file.   sharp pain in her waist, she feels like its internal. She states its been happening for a week in the afternoon   pain at the waist that only occurs in the evening/ night. She states it feels internal and has been present for 1 week. She thought it was soreness from exercise at first but has not went a way and pain comes and goes; with worsening at night.     PMH, PSH, SH reviewed   ROS:    PHYSICAL EXAM:  There were no vitals taken for this visit.      ASSESSMENT/PLAN:   LOL--E2C2 scheduled for WRIST pain, when clearly is for HIP pain.SABRASABRA

## 2024-08-17 ENCOUNTER — Ambulatory Visit: Admitting: Family Medicine

## 2024-08-17 ENCOUNTER — Telehealth: Payer: Self-pay

## 2024-08-17 ENCOUNTER — Encounter: Payer: Self-pay | Admitting: Family Medicine

## 2024-08-17 VITALS — BP 120/68 | HR 76 | Temp 98.4°F | Ht 66.0 in | Wt 118.2 lb

## 2024-08-17 DIAGNOSIS — E1165 Type 2 diabetes mellitus with hyperglycemia: Secondary | ICD-10-CM

## 2024-08-17 DIAGNOSIS — Z23 Encounter for immunization: Secondary | ICD-10-CM

## 2024-08-17 DIAGNOSIS — R10A1 Flank pain, right side: Secondary | ICD-10-CM

## 2024-08-17 DIAGNOSIS — M7918 Myalgia, other site: Secondary | ICD-10-CM

## 2024-08-17 LAB — POCT URINALYSIS DIP (PROADVANTAGE DEVICE)
Bilirubin, UA: NEGATIVE
Blood, UA: NEGATIVE
Glucose, UA: >=1000 mg/dL — AB
Leukocytes, UA: NEGATIVE
Nitrite, UA: NEGATIVE
Protein Ur, POC: NEGATIVE mg/dL
Specific Gravity, Urine: 1.01
Urobilinogen, Ur: 0.2
pH, UA: 6 (ref 5.0–8.0)

## 2024-08-17 NOTE — Patient Instructions (Addendum)
 Your discomfort seems to be muscular, not internal. Warm compresses and gentle stretches may help. You can use topical medications like voltaren gel, or lidocaine  (patch, cream), or Biofreeze or Icy Hot. I suspect that some reaching (ie Christmas decorating) may contribute. It should improve over the next few days to a week. If it isn't, then you should try an oral anti-inflammatory (aleve, advil, etc).  We discussed getting Prevnar-20.  You can get this when you come for your labs in January, or at your visit in April

## 2024-08-17 NOTE — Telephone Encounter (Signed)
 Please re-verify benefits for Monovisc for left knee OA.

## 2024-08-17 NOTE — Telephone Encounter (Signed)
 Patient ran for Monovisc for left knee on 08/17/2024. Case ID: [id number]. Pending approval.

## 2024-08-19 ENCOUNTER — Ambulatory Visit: Admitting: Podiatry

## 2024-08-19 ENCOUNTER — Encounter: Payer: Self-pay | Admitting: Podiatry

## 2024-08-19 DIAGNOSIS — M7752 Other enthesopathy of left foot: Secondary | ICD-10-CM | POA: Diagnosis not present

## 2024-08-19 DIAGNOSIS — D169 Benign neoplasm of bone and articular cartilage, unspecified: Secondary | ICD-10-CM

## 2024-08-19 DIAGNOSIS — M2042 Other hammer toe(s) (acquired), left foot: Secondary | ICD-10-CM | POA: Diagnosis not present

## 2024-08-19 NOTE — Telephone Encounter (Signed)
 Patient does not need the injection at this time. She will call us  when she would like to proceed.

## 2024-08-19 NOTE — Telephone Encounter (Signed)
 Can you schedule patient when medication is stocked   MONOVISC authorized for Left knee Deductible does not apply Once the OOP is met patient covered at 100% Only one copay per DOS $50 OOP MAX $5050 has met $1423.11 PA approved Reference # [id number] 11/09/23-11/07/24

## 2024-08-20 ENCOUNTER — Encounter: Payer: Self-pay | Admitting: Podiatry

## 2024-08-20 NOTE — Progress Notes (Signed)
 Subjective:   Patient ID: Tracey Santiago, female   DOB: 68 y.o.   MRN: [id number]   HPI Patient states the left foot pain is back like it was and I had relief for around 3 to 4 weeks.  States it is very tender between the 3rd and 4th toes and underneath the foot with a lesion that when trimmed seem to get better for short period of time.  Patient states the pain burns and can be debilitating at times   ROS      Objective:  Physical Exam  Neurovascular status intact with exquisite discomfort on the third digit left foot lateral side with lesion formation that is very small and discrete with a significant rotation of the fourth digit left foot that is pressing against the third toe and creating pathology.  Also noted there to be significant pain on the left plantar second metatarsal with keratotic lesion that seems to be also involved with her pathology with prominence of the bone structure noted     Assessment:  This appears to be related to 3rd and 4th digit pathology with bone spur third abnormal hammertoe deformity fourth and plantar keratotic lesion left second metatarsal     Plan:  H&P reviewed discussed all conditions great length and Sharp sterile debridement of lesion and lesion under was done for temporary as she has a lot of vacations coming.  I did discuss removal of bone spur left third toe distal arthroplasty left fourth toe and elevating osteotomy second metatarsal left and she wants this done and I allowed her to read consent form going over alternative treatments complications associated with it and she is willing to accept this risk and after extensive review she signed consent form.  She understands that it will take several months for this to get better completely and at this point she is dispensed air fracture walker short that is fitted to her lower leg and will be used prior to surgery to take pressure off her foot and to wear to get used to it and will be used in the  postoperative.  To protect against osteotomy surgery.  All questions answered today and scheduled for January

## 2024-08-26 NOTE — Telephone Encounter (Signed)
 Patient has scheduled FYI

## 2024-08-26 NOTE — Telephone Encounter (Signed)
 Noted

## 2024-08-31 NOTE — Progress Notes (Unsigned)
° °  LILLETTE Ileana Collet, PhD, LAT, ATC acting as a scribe for Artist Lloyd, MD.  Tracey Santiago is a 68 y.o. female who presents to [facility] today for exacerbation of her L knee pain. Pt was last seen by Dr. Lloyd on 06/02/24 and was given a L knee steroid injection. We also auth gel shot.  Today, pt reports ***   Dx imaging: 09/18/23 L knee MRI             07/20/23 L knee XR  Pertinent review of systems: ***  Relevant historical information: ***   Exam:  There were no vitals taken for this visit. General: Well Developed, well nourished, and in no acute distress.   MSK: ***    Lab and Radiology Results No results found for this or any previous visit (from the past 72 hours). No results found.     Assessment and Plan: 68 y.o. female with ***   PDMP not reviewed this encounter. No orders of the defined types were placed in this encounter.  No orders of the defined types were placed in this encounter.    Discussed warning signs or symptoms. Please see discharge instructions. Patient expresses understanding.   ***

## 2024-09-01 ENCOUNTER — Ambulatory Visit: Admitting: Family Medicine

## 2024-09-01 ENCOUNTER — Encounter: Payer: Self-pay | Admitting: Family Medicine

## 2024-09-01 ENCOUNTER — Other Ambulatory Visit: Payer: Self-pay

## 2024-09-01 VITALS — BP 122/82 | HR 78 | Ht 66.0 in | Wt 122.0 lb

## 2024-09-01 DIAGNOSIS — M1712 Unilateral primary osteoarthritis, left knee: Secondary | ICD-10-CM

## 2024-09-01 DIAGNOSIS — M25562 Pain in left knee: Secondary | ICD-10-CM | POA: Diagnosis not present

## 2024-09-01 DIAGNOSIS — G8929 Other chronic pain: Secondary | ICD-10-CM

## 2024-09-01 MED ORDER — HYALURONAN 88 MG/4ML IX SOSY
88.0000 mg | PREFILLED_SYRINGE | Freq: Once | INTRA_ARTICULAR | Status: AC
  Administered 2024-09-01: 09:00:00 88 mg via INTRA_ARTICULAR

## 2024-09-01 NOTE — Patient Instructions (Addendum)
 Thank you for coming in today.   You received an injection today. Seek immediate medical attention if the joint becomes red, extremely painful, or is oozing fluid.   See you back as needed. Have a fun trip!

## 2024-09-07 ENCOUNTER — Telehealth: Payer: Self-pay | Admitting: Podiatry

## 2024-09-07 NOTE — Telephone Encounter (Signed)
 Called patient to schedule surgery and she stated she will call back in January to schedule. Made patient aware that Dr.Regals surgery dates fall on Tuesday's.

## 2024-09-26 ENCOUNTER — Other Ambulatory Visit: Payer: Self-pay | Admitting: Family Medicine

## 2024-09-26 DIAGNOSIS — H02729 Madarosis of unspecified eye, unspecified eyelid and periocular area: Secondary | ICD-10-CM

## 2024-09-26 NOTE — Telephone Encounter (Signed)
 Ok to refill?  Last refilled 11/2023

## 2024-10-05 ENCOUNTER — Encounter: Payer: Self-pay | Admitting: Family Medicine

## 2024-10-11 ENCOUNTER — Other Ambulatory Visit: Payer: Self-pay

## 2024-10-11 NOTE — Telephone Encounter (Signed)
 Patient contacted office back to schedule surgery. Patient set for 10/25/2024.Patient not on any GLP1 or blood thinners. Patient does take metformion and jardiance  and has been advised on how/when to d/c use prior to surgery . Patient pharmacy correct in chart.

## 2024-10-12 ENCOUNTER — Telehealth: Payer: Self-pay | Admitting: Podiatry

## 2024-10-12 ENCOUNTER — Other Ambulatory Visit

## 2024-10-12 DIAGNOSIS — Z5181 Encounter for therapeutic drug level monitoring: Secondary | ICD-10-CM

## 2024-10-12 DIAGNOSIS — E785 Hyperlipidemia, unspecified: Secondary | ICD-10-CM

## 2024-10-12 LAB — LIPID PANEL
Chol/HDL Ratio: 1.9 ratio (ref 0.0–4.4)
Cholesterol, Total: 251 mg/dL — ABNORMAL HIGH (ref 100–199)
HDL: 131 mg/dL
LDL Chol Calc (NIH): 110 mg/dL — ABNORMAL HIGH (ref 0–99)
Triglycerides: 61 mg/dL (ref 0–149)
VLDL Cholesterol Cal: 10 mg/dL (ref 5–40)

## 2024-10-12 LAB — HEPATIC FUNCTION PANEL
ALT: 15 [IU]/L (ref 0–32)
AST: 18 [IU]/L (ref 0–40)
Albumin: 5 g/dL — ABNORMAL HIGH (ref 3.9–4.9)
Alkaline Phosphatase: 69 [IU]/L (ref 49–135)
Bilirubin Total: 0.5 mg/dL (ref 0.0–1.2)
Bilirubin, Direct: 0.16 mg/dL (ref 0.00–0.40)
Total Protein: 7.1 g/dL (ref 6.0–8.5)

## 2024-10-12 NOTE — Telephone Encounter (Signed)
 DOS- 10/25/2024  2ND METATARSAL OSTEOTOMY LT- 28308 4TH HAMMERTOE REPAIR LT- 71714 3RD EXOSTECTOMY LT- 71891  [employer] EFFECTIVE DATE- 09/15/2021  DEDUCTIBLE- $2000 REMAINING- $2000 OOP- $5750 REMAINING- $5750 FAMILY DEDUCTIBLE- $4000 REMAINING- $4000 FAMILY OOP- $11500 REMAINING- $11500 COINSURANCE- 20%  PER AVAILITY PORTAL, PRIOR AUTH IS NOT REQUIRED FOR CPT CODES 71691, 71714, 71891.

## 2024-10-13 ENCOUNTER — Telehealth: Payer: Self-pay | Admitting: Podiatry

## 2024-10-13 ENCOUNTER — Ambulatory Visit: Payer: Self-pay | Admitting: Family Medicine

## 2024-10-13 DIAGNOSIS — E785 Hyperlipidemia, unspecified: Secondary | ICD-10-CM

## 2024-10-13 MED ORDER — ROSUVASTATIN CALCIUM 10 MG PO TABS: 10.0000 mg | ORAL_TABLET | Freq: Every day | ORAL | 0 refills | Status: AC

## 2024-10-13 NOTE — Telephone Encounter (Signed)
 Patient called and cancelled surgery due to a change in her availability. Patient states she will contact office back once she has sorted her schedule.[facility] notified and post-ops cancelled.

## 2024-10-19 ENCOUNTER — Encounter: Payer: Self-pay | Admitting: Family Medicine

## 2024-10-19 DIAGNOSIS — G4700 Insomnia, unspecified: Secondary | ICD-10-CM

## 2024-10-19 DIAGNOSIS — F419 Anxiety disorder, unspecified: Secondary | ICD-10-CM

## 2024-10-19 MED ORDER — ZOLPIDEM TARTRATE 5 MG PO TABS: 5.0000 mg | ORAL_TABLET | Freq: Every evening | ORAL | 0 refills | Status: AC | PRN

## 2024-10-19 MED ORDER — ALPRAZOLAM 0.25 MG PO TABS: 0.2500 mg | ORAL_TABLET | Freq: Three times a day (TID) | ORAL | 0 refills | Status: AC | PRN

## 2024-10-31 ENCOUNTER — Encounter: Admitting: Podiatry

## 2024-11-14 ENCOUNTER — Encounter: Admitting: Podiatry

## 2025-01-09 ENCOUNTER — Ambulatory Visit: Payer: Self-pay | Admitting: Family Medicine

## 2025-01-26 ENCOUNTER — Other Ambulatory Visit

## 2025-08-07 ENCOUNTER — Encounter: Payer: Self-pay | Admitting: Family Medicine
